# Patient Record
Sex: Female | Born: 1985 | Race: White | Hispanic: No | Marital: Married | Smoking: Never smoker
Health system: Southern US, Community
[De-identification: ages and names within clinical notes are randomized; demographics above are authoritative.]

## PROBLEM LIST (undated history)

## (undated) DIAGNOSIS — N201 Calculus of ureter: Secondary | ICD-10-CM

## (undated) DIAGNOSIS — M797 Fibromyalgia: Secondary | ICD-10-CM

## (undated) DIAGNOSIS — Z808 Family history of malignant neoplasm of other organs or systems: Secondary | ICD-10-CM

## (undated) DIAGNOSIS — I7300 Raynaud's syndrome without gangrene: Secondary | ICD-10-CM

## (undated) DIAGNOSIS — T7840XA Allergy, unspecified, initial encounter: Secondary | ICD-10-CM

## (undated) DIAGNOSIS — G43909 Migraine, unspecified, not intractable, without status migrainosus: Secondary | ICD-10-CM

## (undated) HISTORY — DX: Family history of malignant neoplasm of other organs or systems: Z80.8

## (undated) HISTORY — PX: ABDOMINAL HYSTERECTOMY: SHX81

## (undated) HISTORY — PX: DILATION AND CURETTAGE OF UTERUS: SHX78

## (undated) HISTORY — PX: TUBAL LIGATION: SHX77

## (undated) HISTORY — PX: APPENDECTOMY: SHX54

## (undated) HISTORY — PX: TONSILLECTOMY: SUR1361

## (undated) HISTORY — PX: BREAST BIOPSY: SHX20

## (undated) HISTORY — DX: Allergy, unspecified, initial encounter: T78.40XA

## (undated) HISTORY — PX: LAPAROSCOPY: SHX197

## (undated) HISTORY — PX: OTHER SURGICAL HISTORY: SHX169

---

## 2005-06-27 ENCOUNTER — Emergency Department (HOSPITAL_COMMUNITY): Admission: EM | Admit: 2005-06-27 | Discharge: 2005-06-27 | Payer: Self-pay | Admitting: Family Medicine

## 2005-07-20 ENCOUNTER — Other Ambulatory Visit: Admission: RE | Admit: 2005-07-20 | Discharge: 2005-07-20 | Payer: Self-pay | Admitting: Gynecology

## 2005-09-13 ENCOUNTER — Observation Stay (HOSPITAL_COMMUNITY): Admission: AD | Admit: 2005-09-13 | Discharge: 2005-09-14 | Payer: Self-pay | Admitting: Gynecology

## 2005-09-14 ENCOUNTER — Inpatient Hospital Stay (HOSPITAL_COMMUNITY): Admission: AD | Admit: 2005-09-14 | Discharge: 2005-09-15 | Payer: Self-pay | Admitting: Gynecology

## 2005-09-14 ENCOUNTER — Inpatient Hospital Stay (HOSPITAL_COMMUNITY): Admission: AD | Admit: 2005-09-14 | Discharge: 2005-09-14 | Payer: Self-pay | Admitting: Gynecology

## 2005-09-18 ENCOUNTER — Inpatient Hospital Stay (HOSPITAL_COMMUNITY): Admission: AD | Admit: 2005-09-18 | Discharge: 2005-09-18 | Payer: Self-pay | Admitting: Gynecology

## 2005-10-25 ENCOUNTER — Inpatient Hospital Stay (HOSPITAL_COMMUNITY): Admission: AD | Admit: 2005-10-25 | Discharge: 2005-10-25 | Payer: Self-pay | Admitting: Gynecology

## 2005-10-29 ENCOUNTER — Observation Stay (HOSPITAL_COMMUNITY): Admission: AD | Admit: 2005-10-29 | Discharge: 2005-10-30 | Payer: Self-pay | Admitting: Gynecology

## 2005-11-12 ENCOUNTER — Inpatient Hospital Stay (HOSPITAL_COMMUNITY): Admission: AD | Admit: 2005-11-12 | Discharge: 2005-11-12 | Payer: Self-pay | Admitting: Gynecology

## 2005-12-12 ENCOUNTER — Inpatient Hospital Stay (HOSPITAL_COMMUNITY): Admission: AD | Admit: 2005-12-12 | Discharge: 2005-12-12 | Payer: Self-pay | Admitting: Gynecology

## 2005-12-13 ENCOUNTER — Inpatient Hospital Stay (HOSPITAL_COMMUNITY): Admission: AD | Admit: 2005-12-13 | Discharge: 2005-12-13 | Payer: Self-pay | Admitting: Gynecology

## 2005-12-29 ENCOUNTER — Inpatient Hospital Stay (HOSPITAL_COMMUNITY): Admission: AD | Admit: 2005-12-29 | Discharge: 2005-12-30 | Payer: Self-pay | Admitting: Gynecology

## 2005-12-31 ENCOUNTER — Emergency Department (HOSPITAL_COMMUNITY): Admission: EM | Admit: 2005-12-31 | Discharge: 2005-12-31 | Payer: Self-pay | Admitting: Emergency Medicine

## 2005-12-31 ENCOUNTER — Inpatient Hospital Stay (HOSPITAL_COMMUNITY): Admission: AD | Admit: 2005-12-31 | Discharge: 2005-12-31 | Payer: Self-pay | Admitting: Gynecology

## 2006-01-08 ENCOUNTER — Inpatient Hospital Stay (HOSPITAL_COMMUNITY): Admission: AD | Admit: 2006-01-08 | Discharge: 2006-01-11 | Payer: Self-pay | Admitting: Gynecology

## 2006-01-09 ENCOUNTER — Encounter (INDEPENDENT_AMBULATORY_CARE_PROVIDER_SITE_OTHER): Payer: Self-pay | Admitting: Specialist

## 2006-02-16 ENCOUNTER — Other Ambulatory Visit: Admission: RE | Admit: 2006-02-16 | Discharge: 2006-02-16 | Payer: Self-pay | Admitting: Gynecology

## 2006-08-22 ENCOUNTER — Inpatient Hospital Stay (HOSPITAL_COMMUNITY): Admission: AD | Admit: 2006-08-22 | Discharge: 2006-08-22 | Payer: Self-pay | Admitting: Gynecology

## 2006-11-28 ENCOUNTER — Inpatient Hospital Stay (HOSPITAL_COMMUNITY): Admission: AD | Admit: 2006-11-28 | Discharge: 2006-11-30 | Payer: Self-pay | Admitting: Obstetrics and Gynecology

## 2006-11-28 ENCOUNTER — Ambulatory Visit: Payer: Self-pay | Admitting: Physician Assistant

## 2006-11-29 ENCOUNTER — Encounter: Payer: Self-pay | Admitting: Obstetrics and Gynecology

## 2007-03-15 ENCOUNTER — Ambulatory Visit: Payer: Self-pay | Admitting: *Deleted

## 2007-03-15 ENCOUNTER — Inpatient Hospital Stay (HOSPITAL_COMMUNITY): Admission: AD | Admit: 2007-03-15 | Discharge: 2007-03-16 | Payer: Self-pay | Admitting: Obstetrics & Gynecology

## 2007-06-28 ENCOUNTER — Emergency Department (HOSPITAL_COMMUNITY): Admission: EM | Admit: 2007-06-28 | Discharge: 2007-06-28 | Payer: Self-pay | Admitting: Emergency Medicine

## 2008-06-28 ENCOUNTER — Ambulatory Visit: Payer: Self-pay | Admitting: Vascular Surgery

## 2008-06-28 ENCOUNTER — Ambulatory Visit: Admission: RE | Admit: 2008-06-28 | Discharge: 2008-06-28 | Payer: Self-pay | Admitting: Interventional Radiology

## 2008-06-28 ENCOUNTER — Encounter (INDEPENDENT_AMBULATORY_CARE_PROVIDER_SITE_OTHER): Payer: Self-pay | Admitting: Interventional Radiology

## 2008-09-08 ENCOUNTER — Inpatient Hospital Stay (HOSPITAL_COMMUNITY): Admission: AD | Admit: 2008-09-08 | Discharge: 2008-09-09 | Payer: Self-pay | Admitting: Obstetrics & Gynecology

## 2008-09-24 ENCOUNTER — Other Ambulatory Visit: Admission: RE | Admit: 2008-09-24 | Discharge: 2008-09-24 | Payer: Self-pay | Admitting: Obstetrics and Gynecology

## 2008-12-13 ENCOUNTER — Ambulatory Visit (HOSPITAL_COMMUNITY): Admission: RE | Admit: 2008-12-13 | Discharge: 2008-12-13 | Payer: Self-pay | Admitting: Obstetrics and Gynecology

## 2008-12-13 ENCOUNTER — Encounter (INDEPENDENT_AMBULATORY_CARE_PROVIDER_SITE_OTHER): Payer: Self-pay | Admitting: Obstetrics and Gynecology

## 2008-12-14 ENCOUNTER — Inpatient Hospital Stay: Admission: AD | Admit: 2008-12-14 | Discharge: 2008-12-14 | Payer: Self-pay | Admitting: Obstetrics and Gynecology

## 2008-12-16 ENCOUNTER — Inpatient Hospital Stay (HOSPITAL_COMMUNITY): Admission: AD | Admit: 2008-12-16 | Discharge: 2008-12-16 | Payer: Self-pay | Admitting: Obstetrics and Gynecology

## 2009-05-31 ENCOUNTER — Inpatient Hospital Stay (HOSPITAL_COMMUNITY): Admission: AD | Admit: 2009-05-31 | Discharge: 2009-05-31 | Payer: Self-pay | Admitting: Obstetrics and Gynecology

## 2009-12-29 ENCOUNTER — Emergency Department (HOSPITAL_COMMUNITY): Admission: EM | Admit: 2009-12-29 | Discharge: 2009-12-30 | Payer: Self-pay | Admitting: Emergency Medicine

## 2010-03-23 ENCOUNTER — Inpatient Hospital Stay (INDEPENDENT_AMBULATORY_CARE_PROVIDER_SITE_OTHER)
Admission: RE | Admit: 2010-03-23 | Discharge: 2010-03-23 | Disposition: A | Discharge status: Home / Self Care | Payer: 59 | Source: Ambulatory Visit | Attending: Emergency Medicine | Admitting: Emergency Medicine

## 2010-03-23 DIAGNOSIS — J039 Acute tonsillitis, unspecified: Secondary | ICD-10-CM

## 2010-03-23 LAB — POCT RAPID STREP A (OFFICE): Streptococcus, Group A Screen (Direct): NEGATIVE

## 2010-04-22 LAB — BASIC METABOLIC PANEL
BUN: 9 mg/dL (ref 6–23)
CO2: 25 mEq/L (ref 19–32)
Chloride: 104 mEq/L (ref 96–112)
GFR calc non Af Amer: >60 mL/min (ref >60)
Glucose, Bld: 86 mg/dL (ref 70–99)
Potassium: 3.9 mEq/L (ref 3.5–5.1)
Sodium: 136 mEq/L (ref 135–145)

## 2010-04-22 LAB — CBC
HCT: 36.8 % (ref 36.0–46.0)
Hemoglobin: 12.5 g/dL (ref 12.0–15.0)
MCH: 26.2 pg (ref 26.0–34.0)
MCHC: 34 g/dL (ref 30.0–36.0)
MCV: 77 fL — ABNORMAL LOW (ref 78.0–100.0)
RDW: 14.3 % (ref 11.5–15.5)

## 2010-04-22 LAB — DIFFERENTIAL
Basophils Absolute: 0.1 10*3/uL (ref 0.0–0.1)
Basophils Relative: 1 % (ref 0–1)
Eosinophils Relative: 2 % (ref 0–5)
Monocytes Absolute: 0.5 10*3/uL (ref 0.1–1.0)
Monocytes Relative: 10 % (ref 3–12)
Neutro Abs: 2.6 10*3/uL (ref 1.7–7.7)

## 2010-04-22 LAB — URINALYSIS, ROUTINE W REFLEX MICROSCOPIC
Glucose, UA: NEGATIVE mg/dL
Ketones, ur: NEGATIVE mg/dL
Leukocytes, UA: NEGATIVE
Nitrite: NEGATIVE
Protein, ur: NEGATIVE mg/dL
Urobilinogen, UA: 0.2 mg/dL (ref 0.0–1.0)

## 2010-04-22 LAB — URINE MICROSCOPIC-ADD ON

## 2010-04-29 LAB — URINALYSIS, ROUTINE W REFLEX MICROSCOPIC
Bilirubin Urine: NEGATIVE
Glucose, UA: NEGATIVE mg/dL
Hgb urine dipstick: NEGATIVE
Protein, ur: NEGATIVE mg/dL
Specific Gravity, Urine: 1.02 (ref 1.005–1.030)

## 2010-05-05 ENCOUNTER — Other Ambulatory Visit: Payer: Self-pay | Admitting: Otolaryngology

## 2010-05-14 LAB — DIFFERENTIAL
Basophils Absolute: 0 10*3/uL (ref 0.0–0.1)
Basophils Relative: 1 % (ref 0–1)
Eosinophils Absolute: 0.1 10*3/uL (ref 0.0–0.7)
Eosinophils Relative: 2 % (ref 0–5)
Monocytes Absolute: 0.5 10*3/uL (ref 0.1–1.0)

## 2010-05-14 LAB — URINALYSIS, ROUTINE W REFLEX MICROSCOPIC
Glucose, UA: 100 mg/dL — AB
Specific Gravity, Urine: 1.015 (ref 1.005–1.030)
pH: 5 (ref 5.0–8.0)

## 2010-05-14 LAB — CBC
HCT: 35.1 % — ABNORMAL LOW (ref 36.0–46.0)
HCT: 38 % (ref 36.0–46.0)
Hemoglobin: 11.8 g/dL — ABNORMAL LOW (ref 12.0–15.0)
MCHC: 33.6 g/dL (ref 30.0–36.0)
MCHC: 33.7 g/dL (ref 30.0–36.0)
MCV: 78.5 fL (ref 78.0–100.0)
Platelets: 194 10*3/uL (ref 150–400)
RBC: 4.84 MIL/uL (ref 3.87–5.11)
RDW: 15 % (ref 11.5–15.5)
WBC: 4.4 10*3/uL (ref 4.0–10.5)

## 2010-05-14 LAB — URINE MICROSCOPIC-ADD ON

## 2010-05-18 LAB — CBC
MCHC: 33.6 g/dL (ref 30.0–36.0)
Platelets: 203 10*3/uL (ref 150–400)
RDW: 15.1 % (ref 11.5–15.5)

## 2010-05-18 LAB — ABO/RH
ABO/RH(D): O NEG
Weak D: NEGATIVE

## 2010-05-18 LAB — WET PREP, GENITAL: Yeast Wet Prep HPF POC: NONE SEEN

## 2010-05-18 LAB — POCT PREGNANCY, URINE: Preg Test, Ur: NEGATIVE

## 2010-06-24 NOTE — Discharge Summary (Signed)
NAME:  Cindy Turner, Cindy Turner NO.:  [id number]   MEDICAL RECORD NO.:  [id number]          PATIENT TYPE:  OUT   LOCATION:  MFM                           [facility]   PHYSICIAN:  Ginger Carne, MD  DATE OF BIRTH:  Nov 13, 1985   DATE OF ADMISSION:  11/29/2006  DATE OF DISCHARGE:                               DISCHARGE SUMMARY   REASON FOR HOSPITALIZATION:  Twenty and four-sevenths weeks' gestation,  bleeding.   IN-HOSPITAL PROCEDURES:  Pelvic sonogram.   FINAL DIAGNOSES:  1. Twenty and four-sevenths weeks' gestation.  2. Bleeding.   HOSPITAL COURSE:  This patient is a 25 year old G2, P0-1-0-1 Caucasian  female, estimated date of confinement of April 13, 2007, twenty and four-  sevenths weeks' gestation by dates, who presented with vaginal bleeding  on November 28, 2006 to the [facility] [place].  The patient  denies having trauma or intercourse preceding said event.  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  She has also had a preterm delivery at 6  weeks' gestation and she is O-negative blood type.   The patient was admitted and placed to bedrest, consult by Maternal-  Fetal Medicine obtained.  Ultrasound obtained on November 29, 2006  confirmed dating of 20-5/7 weeks' gestation.  Specifically, her cervix  demonstrated a length of 4.2 cm.  Two amniotic bands were noted as well.  The umbilical cord was hypo-coiled as well.  Recommendation by  ultrasound report from Dr. Ander Slade included concerns pertaining to preterm  birth, trisomy and fetal demise and followup as recommended including  additional fetal testing as well as assessment of fetal growth every 3-4  weeks.   The patient's urine culture demonstrated greater than 100,000 group B  strep and she was placed on amoxicillin 500 mg three times a day in  addition to beginning 17-hydroxyprogesterone caproate 250 mg, received  on  November 29, 2006.  The patient also received 1 ampule of RhoGAM.   The patient was evaluated on the morning of discharge.  Her cervix was  long, closed and thick with scant vaginal flow.  She has minimal pelvic  cramping.  Discussion with her primary obstetrician, Dr. Alvino Chapel, confirmed her status as an active patient in his practice.  He  was agreeable to receive the patient back from [facility].  Course  of care and recommendations were also discussed with Dr. Donah Driver  including continuation of 17-hydroxyprogesterone caproate and ultrasound  findings with attention to amniotic bands and hypocoiled cord. Discharge  summary and u/s report faxed to his office.   DISCHARGE INSTRUCTIONS:  The patient was discharged with routine  instructions including avoiding intercourse, heavy lifting or other  extraneous phacoemulsification, to report to her primary attending any  episodes of bleeding, significant cramping or other concerns.  She  understands the importance of followup appointment and that  her next progesterone injection is due on December 06, 2006.  She was  also given a 7-day course of amoxicillin 500 mg one 3 times a day for 7  days including continuation  of prenatal vitamins.  All questions  answered to the satisfaction of said patient and the patient verbalized  understanding of same.      Ginger Carne, MD  Electronically Signed     SHB/MEDQ  D:  11/30/2006  T:  11/30/2006  Job:  [id number]   cc:   Selinda Michaels MD  Fax #[phone]

## 2010-06-27 NOTE — H&P (Signed)
NAME:  Cindy Turner, Cindy Turner          ACCOUNT NO.:  [id number]   MEDICAL RECORD NO.:  [id number]          PATIENT TYPE:  INP   LOCATION:  9316                          [facility]ILITY:  [facility]   PHYSICIAN:  Timothy P. Fontaine, M.D.DATE OF BIRTH:  March 15, 1985   DATE OF ADMISSION:  09/13/2005  DATE OF DISCHARGE:                                HISTORY & PHYSICAL   CHIEF COMPLAINT:  Pregnancy at 19 to 20 weeks, back for right hip and flank  pain.   HISTORY OF PRESENT ILLNESS:  A 25 year old G1, P0, female at 17 to [redacted] weeks  gestation, two day history of right-sided back pain worsening now, radiating  to her flank and right hip.  The patient has had some nausea and vomiting  associated with this, and some mild constipation.  She has had no vaginal  bleeding, no uterine cramping or contractions, but notes some dysuria, no  real frequency.  She does have a history of left renal lithiasis in the  past, apparently was told they were very small and that she would pass them,  and that no special followup was given.   PAST MEDICAL HISTORY:  Uncomplicated.   PAST SURGICAL HISTORY:  Uncomplicated.   ALLERGIES:  No known drug allergies.   CURRENT MEDICATIONS:  Prenatal vitamins.   REVIEW OF SYSTEMS:  Noncontributory.   SOCIAL HISTORY:  Noncontributory.   FAMILY HISTORY:  Noncontributory.   ADMISSION PHYSICAL EXAMINATION:  VITAL SIGNS:  Temperature 98.3, vital signs  are stable.  HEENT:  Normal.  LUNGS:  Clear.  CARDIAC:  Regular rate.  No murmurs, rubs, or gallops.  ABDOMEN:  Benign.  Uterus U-1, soft, nontender, positive fetal heart tones.  Mild to moderate right suprapubic pain to palpation.  BACK:  Visually normal.  Percussion with significant right CVA tenderness.  PELVIC:  Digital:  Cervix is long and closed.   LABORATORY DATA:  Maternal renal ultrasound shows mild to moderate right  hydronephrosis.  Fetal ultrasound appropriate for dates, normal AFI, normal  placenta, cervix 3.1  trans-abdominally.  Laboratory studies show white count  10.9, hemoglobin 12.5, platelet count 213,000.  Urinalysis shows 7 to 10  WBCs, 3 to 6 RBCs, few bacteria, a few squamous cells.   ASSESSMENT AND PLAN:  A 25 year old G1, P0, 19 weeks, right back to flank  pain, history of renal lithiasis in the past.  Urinalysis suspicious for  possible urinary tract infection.  Mild to moderate hydronephrosis on the  right, all consistent with probable right ureterovesical stone.  I discussed  with the patient and her husband we will plan on  admission for pain control, hydration, and hopefully spontaneous passage.  We will strain all urine and prescribed appropriate pain medication.  We  will also cover with antibiotics given the number of white blood cells and a  few bacteria within the urine.  Consider urology consultation if not  improved within 24 hours.      Timothy P. Fontaine, M.D.  Electronically Signed     TPF/MEDQ  D:  09/13/2005  T:  09/13/2005  Job:  [id number]

## 2010-06-27 NOTE — H&P (Signed)
NAME:  Cindy Turner, Cindy Turner          ACCOUNT NO.:  [id number]   MEDICAL RECORD NO.:  [id number]          PATIENT TYPE:  OBV   LOCATION:  9156                          [facility]  [facility]   PHYSICIAN:  Timothy P. Fontaine, M.D.DATE OF BIRTH:  [birth year]   DATE OF ADMISSION:  10/29/2005  DATE OF DISCHARGE:                                HISTORY & PHYSICAL   CHIEF COMPLAINT:  Vaginal bleeding.   HISTORY OF PRESENT ILLNESS:  This 25 year old G-1, P-0 female at [redacted] weeks  gestation, who presents having awoken from a sleep this afternoon with  vaginal bleeding.  The patient notes some pelvis discomfort described as  aching.  No real cramping, sharp pain, but more aching, in that she was  sleeping when her husband noticed some blood on the sheets.  He awakened her  and they came straight to the office for evaluation.  Of note, the patient  and her husband had intercourse this morning of presentation.  She has no  history of bleeding before.  She was recently evaluated two days ago,  complaining of some cramping and pelvic pressure symptoms.  She was  evaluated in the emergency room.  Her external monitor showed no  contractions.  Her cervix was long and closed.  A fetal fibronectin was  negative.  Her urinalysis was unremarkable.  Her prenatal course has been  otherwise uncomplicated, noting for her OB, her HCC and Chlamydia screen  were done which were negative.  For the remainder of her history, see her  Hollister.   PHYSICAL EXAMINATION:  VITAL SIGNS:  Afebrile, vital signs are stable.  HEENT:  Normal.  LUNGS:  Clear.  HEART:  A regular rate.  No rubs, murmurs or gallops.  ABDOMEN:  Benign.  PELVIC:  Uterus appropriate for dates.  Positive fetal heart tones.  No  tenderness, masses or acute changes.  On pelvic external shows some blood  staining on the vulva.  Initial vaginal exam shows some light blood  staining.  No active bleeding.  Speculum exam shows the cervix to be long  and closed.   Cervix appears to be friable with some oozing.  Cervical mucus  noted to be normal in appearance, with no blood in the actual mucus or  coming from the os, but on the external cervical surface.  GC and Chlamydia  screen were done.  BIMANUAL:  The uterus again appropriate for dates.  No acute changes.  Her  cervix is long and closed by digital examination.   ASSESSMENT/PLAN:  This 25 year old gravida 1, para 0, [redacted] weeks gestation  with bleeding following intercourse.  Examination is consistent with a  friable cervix.  Discharge appears normal.  Slight oozing at the cervix now.  No acute significant bleeding does not appear to be from the cervical os:  The Gonorrhea and Chlamydia screen were done.  We will go ahead and admit  her to the hospital overnight for observation due to her 25 weeks with  bleeding.  External monitors will rule out contractions.  An ultrasound for  the placental check.  Rule out abruption or other  abnormalities and cervical length  baseline.  Check Kleihauer-Betke.  Also  baseline urine culture, again noting her urinalysis was unremarkable several  days ago.   Will observe overnight.  If she does well, then will plan on discharge in  the a.m.      Timothy P. Fontaine, M.D.  Electronically Signed     TPF/MEDQ  D:  10/29/2005  T:  10/30/2005  Job:  [id number]

## 2010-06-27 NOTE — Discharge Summary (Signed)
NAME:  Cindy Turner, Cindy Turner          ACCOUNT NO.:  [id number]   MEDICAL RECORD NO.:  [id number]          PATIENT TYPE:  INP   LOCATION:  9316                          [facility]  [facility]   PHYSICIAN:  Timothy P. Fontaine, M.D.DATE OF BIRTH:  Feb 12, 1985   DATE OF ADMISSION:  09/13/2005  DATE OF DISCHARGE:                                 DISCHARGE SUMMARY   DISCHARGE DIAGNOSES:  1.  Pregnancy at 19 weeks.  2.  Renal lithiasis.  3.  Questionable urinary tract infection.   PROCEDURE:  None.   HOSPITAL COURSE:  The patient was admitted with right back flank pain  consistent with renal lithiasis.  She did a urinalysis which showed 7-10  wbc's, 3-6 rbc's, few bacteria, and hyaline casts.  A WBC of 10.9, platelet  count of 214, hemoglobin of 12.5.  She had a maternal renal ultrasound that  showed mild to moderate right hydronephrosis and a fetal ultrasound which  was consistent with dates.  No placental abnormality, normal amniotic fluid  and the cervix 3.1 cm, closed.  The patient was begun on IV hydration, pain  management, straining of her urine, IV antibiotics of cefotetan 1 a q.12h.  The morning following admission the patient reported passing a granular area  in her urine and inspection showed a very small sandy-size granular area.  The patient reported that her pain was much improved and on exam she had no  CVA tenderness or abdominal tenderness.  The patient was discharged home to  continue hydration, rest, Macrobid 100 b.i.d. x7 days, Tylenol No. 3 #20 one  to two p.o. q.4-6h. p.r.n. pain with instructions to follow up in 1 week in  the office, sooner ASAP if increasing pain, abdominal pain, vaginal bleeding  or any other symptoms.      Timothy P. Fontaine, M.D.  Electronically Signed     TPF/MEDQ  D:  09/14/2005  T:  09/14/2005  Job:  [id number]

## 2010-06-27 NOTE — H&P (Signed)
NAME:  Cindy Turner, MENTER          ACCOUNT NO.:  [id number]   MEDICAL RECORD NO.:  [id number]          PATIENT TYPE:  INP   LOCATION:  9168                          [facility]   PHYSICIAN:  Timothy P. Fontaine, M.D.DATE OF BIRTH:  1985/03/24   DATE OF ADMISSION:  01/08/2006  DATE OF DISCHARGE:                              HISTORY & PHYSICAL   CHIEF COMPLAINT:  Labor.   HISTORY OF PRESENT ILLNESS:  A 25 year old G1, P0 female at [redacted] weeks  gestation admitted with complaints of regular contractions every 5  minutes. The patient was admitted per triage at 3-4 cm dilated, bloody  show, regular contractions every 5 minutes, membranes intact. Positive  beta strep history. She was admitted for labor and delivery observation.  Was begun on antibiotic strep prophylaxis. Prenatal course has been  complicated by multiple ER evaluations for contractions and abdominal  pain. Rule out labor. She is RH negative and is positive beta strep  carrier.   PHYSICAL EXAMINATION:  VITAL SIGNS:  Afebrile, stable.  HEENT:  Normal, clear.  CARDIAC:  Regular rate. No rubs, murmurs or gallops.  ABDOMEN:  Gravid vertex fetus consistent with 36 weeks. External monitor  showed contractions every 5 minutes with reactive fetal tracing.  PELVIC:  Shows positive bloody show, 60-70% effaced, -2 station, 2-3 cm  dilated. Bulging membranes noted, vertex presentation. Situation was  discussed with patient and her family. Appears to be in prodromal early  labor at 36 weeks. Has received antibiotic prophylaxis for positive beta  strep carrier. Options of continued expectant management versus  artificial rupture of membranes was discussed, and the patient strongly  desires to proceed with artificial rupture of membranes and delivery.  Again the issues of prematurity were reviewed. The potential for  neonatal ICU admission is discussed all of which she understands and  accepts. The patient's membranes were subsequently  ruptured. The fluid  noted to be clear. Internal monitors were placed.      Timothy P. Fontaine, M.D.  Electronically Signed     TPF/MEDQ  D:  01/09/2006  T:  01/09/2006  Job:  [id number]

## 2010-06-27 NOTE — H&P (Signed)
NAME:  Cindy Turner, Cindy Turner          ACCOUNT NO.:  [id number]   MEDICAL RECORD NO.:  [id number]          PATIENT TYPE:  INP   LOCATION:  9160                          [facility]   PHYSICIAN:  Timothy P. Fontaine, M.D.DATE OF BIRTH:  10-18-1985   DATE OF ADMISSION:  12/29/2005  DATE OF DISCHARGE:                                HISTORY & PHYSICAL   CHIEF COMPLAINT:  Contractions.   HISTORY OF PRESENT ILLNESS:  A 25 year old G1, P0, female at 34-6/7 weeks'  gestation, who presents complaining of contractions.  The patient's prenatal  course has been complicated by multiple presentations for cramping and  contractions, all without cervical change.  The patient relates last p.m.  increasing contractions every 5 minutes and was instructed to present to  maternity admissions for evaluation.  Upon evaluation in maternity  admissions, she was found to be contracting every 3-5 minutes with a  reported cervical exam of 3 cm with bloody show and was admitted for  observation.  The patient was noted to have mild elevated blood pressures in  the 130-140 over 80-90 range, and PIH labs were also obtained.  She was  noted to have negative urinary protein and normal PIH labs.  The patient was  monitored throughout the evening and upon evaluation this morning, her  contractions have spaced out to every 8-10 minutes.  The patient also upon  admission had complained to the nursing staff of some epigastric discomfort,  headaches, and visual changes, although on questioning the patient relates  that these have been present on and off for weeks and are more consistent  with pregnancy-related changes and not hypertensive-related.  Prenatal  course otherwise has been uncomplicated other than her multiple bouts of  cramping.  She is Rh negative.  The father is Rh positive.  For the  remainder of the history, see her Hollister.   PHYSICAL EXAMINATION:  VITAL SIGNS:  On exam this morning, blood pressure is  130/70.  Vital signs are stable.  HEENT:  Normal.  LUNGS:  Clear.  CARDIAC:  Regular rate.  No rubs, murmurs or gallops.  ABDOMEN:  Gravid, consistent with dates.  External monitor shows a reactive  fetal tracing, no decelerations.  Contractions every 8 minutes.  PELVIC:  Cervix is tight fingertip, 50%, high, ballottable, vertex.  EXTREMITIES:  Without edema.  DTRs 2+, one beat clonus.   ASSESSMENT AND PLAN:  Thirty-four to 35 weeks, contractions, without  significant cervical change from prior office exams.  Contractions now  spacing out.  The patient did receive IV hydration.  She was also given an  antibiotic dose for strep protocol in anticipation of vaginal delivery.  Discussed the situation with the patient and her significant other, at 9-  6/7, issues of proceeding with delivery versus expectant management, no  steroids.  Given the total picture, we will withhold steroids at this time  as do not anticipate immediate delivery.  Recommend oral tocolytic, decrease  symptomatic contractions.  Does not appear to be in overt labor.  Will start  with terbutaline 5 mg now and 2.5 mg q.4h., see if contractions resolve.  The  patient otherwise is doing well.  Will monitor blood pressure.  If acceptable, will ultimately plan to discharge later for at-home bed rest,  continued short course of oral tocolytics.  Will check ultrasound for  placental abnormalities, AFI, prior to discharge.  The plan was discussed  with the patient and her significant other, who agree with the plan.      Timothy P. Fontaine, M.D.  Electronically Signed     TPF/MEDQ  D:  12/30/2005  T:  12/30/2005  Job:  [id number]

## 2010-06-27 NOTE — H&P (Signed)
NAME:  Cindy Turner, PINCOCK NO.:  [id number]   MEDICAL RECORD NO.:  [id number]          PATIENT TYPE:  MAT   LOCATION:  MATC                          [facility]   PHYSICIAN:  Timothy P. Fontaine, M.D.DATE OF BIRTH:  19-Mar-1985   DATE OF ADMISSION:  11/12/2005  DATE OF DISCHARGE:                                HISTORY & PHYSICAL   CHIEF COMPLAINT:  Abdominal  pain.   HISTORY OF PRESENT ILLNESS:  A 25 year old G1, P0 female at [redacted] weeks  gestation presents with a several-day history of lower abdominal  pelvic  cramping that is coming and going on an infrequent basis.  Patient notes  that the last 12 hours or so the cramping has increased with some radiation  to her back, and she presents for evaluation.  The patient denies any  bleeding, discharge, changes, leak or gush of fluid.  Her history is  significant for similar episodes in the past during this pregnancy.  Workup  at that time was negative.  Patient relates no recent trauma, intercourse or  other precipitating events.  For the remainder of her history, see her  Hollister.   PHYSICAL EXAMINATION:  VITAL SIGNS:  Afebrile.  Vital signs area stable.  HEENT:  Normal.  LUNGS:  Clear.  CARDIAC:  Regular rate without rubs, murmurs or gallops.  ABDOMEN:  Uterus soft and nontender, appropriate for dates.  External  monitor show a reassuring fetal tracing.  No decelerations and no regular  contractions.  BACK:  Spine is straight.  No CVA tenderness.  PELVIC:  Cervix is long.  External os fingertip, internal os closed.  Normal-  appearing vaginal discharge.   ASSESSMENT/PLAN:  A 25 year old G1, P0, [redacted] weeks gestation, pelvic cramping  over the last several days, increased over the last 12 hours, no overt  precipitating events.  Initially on presentation, fetal fibronectin was  obtained.  Digital exam per nursing was per my physical above.  She received  IV hydration.  Her UA is low level, showing some mild leukocyte  esterase,  rare WBCs, few bacteria.  She was begun on IV hydration, and her pain has  resolved over the observation time.  My recheck of her cervix now, per my  physical above, is unchanged from the original nursing check.  Will await  fetal fibronectin results.  Will treat low level UTI with Macrobid 100  b.i.d. x7 days, first dose now before she leaves the hospital.  Will check  ultrasound for cervical length baseline and placental rule out  abnormalities.  She does have an appointment in five days in the office.  She will continue with low level activity until then, if any recurrent  abdominal discomfort at all, discharge, changes, bleeding, spotting.  She  knows to call ASAP.  I think her cervical exam is physiologic, as the  internal os is closed.  External os is slightly opened, and I do not think  this is a result of any contractions.  We will check the ultrasound to rule  out funneling or other evidence of cervical shortening.      Timothy P. Fontaine, M.D.  Electronically Signed     TPF/MEDQ  D:  11/12/2005  T:  11/12/2005  Job:  [id number]

## 2010-06-27 NOTE — H&P (Signed)
NAME:  LESA, VANDALL NO.:  [id number]   MEDICAL RECORD NO.:  [id number]          PATIENT TYPE:  MAT   LOCATION:  MATC                          [facility]   PHYSICIAN:  Juan H. Lily Peer, M.D.DATE OF BIRTH:  November 23, 1985   DATE OF ADMISSION:  09/18/2005  DATE OF DISCHARGE:                                HISTORY & PHYSICAL   HISTORY:  The patient is a 25 year old gravida 1, para 0, estimated date of  confinement February 04, 2006, currently 20 weeks' gestation, had presented  to [facility] today stating she was having some lower abdominal  cramping since Monday and then had diarrhea for two days.  The patient was  seen last weekend and admitted to the hospital due to the fact that she has  had a history of kidney stones and was hydrated and sent home on oral  antibiotic.  The patient was placed on the monitor and uterine  irritability  was noted although at 20 weeks.  There was no gross evidence of any  decelerations on fetal monitoring.  Her vital signs were as follows:  Temperature 98.1, pulse 74, respirations 20, blood pressure 123/62.  Fetal  heart rate 144 beats/min.   PHYSICAL EXAMINATION:  HEART:  Regular rate and rhythm.  No murmurs or  gallops.  ABDOMEN:  Soft and nontender.  PELVIC:  Bartholin's, urethra and Skene's within normal limits.  VAGINA:  There were no gross lesions.  There was no blood in the vaginal  vault, and cervix was long, closed and posterior.   The patient stated she had had some bleeding earlier today, so an ultrasound  was ordered.  The ultrasound demonstrated a viable fetus in the breech  presentation consistent with 20 weeks.  Amniotic fluid volume was normal.  Cervical length was 3.5 cm.  The patient had a wet prep which was  insignificant and her urinalysis otherwise unremarkable.  The patient was  instructed to discontinue her Macrobid that she had taken for three days.  This could have upset her stomach and caused the  diarrhea and the cramping  that she was experiencing.  I cannot determine where she experienced her  blood.  She cannot recall when she had intercourse the past few days.  My  only suspicion is perhaps direct contact from the penis on the cervix  contributed to some bleeding.  There was no evidence of subchorionic bleed  on the ultrasound, and she did not appear to be in any acute distress.  The  patient was instructed to discontinue the Macrobid, stay on a liquid diet  for the next 24 hours and advance her diet as tolerated, and she remains  taking Imodium p.r.n. and to follow up in the office next week.  Refrain  from intercourse and keep up with her fluids and bedrest.  Signs and  symptoms of preterm labor were discussed and information sheet was provided.      Juan H. Lily Peer, M.D.  Electronically Signed     JHF/MEDQ  D:  09/18/2005  T:  09/18/2005  Job:  [id number]

## 2010-07-29 ENCOUNTER — Inpatient Hospital Stay (HOSPITAL_COMMUNITY)
Admission: EM | Admit: 2010-07-29 | Discharge: 2010-07-30 | Disposition: A | Discharge status: Home / Self Care | Payer: 59 | Source: Ambulatory Visit | Attending: Obstetrics and Gynecology | Admitting: Obstetrics and Gynecology

## 2010-07-29 DIAGNOSIS — IMO0001 Reserved for inherently not codable concepts without codable children: Secondary | ICD-10-CM | POA: Insufficient documentation

## 2010-07-29 DIAGNOSIS — N390 Urinary tract infection, site not specified: Secondary | ICD-10-CM | POA: Insufficient documentation

## 2010-07-29 DIAGNOSIS — G43909 Migraine, unspecified, not intractable, without status migrainosus: Secondary | ICD-10-CM | POA: Insufficient documentation

## 2010-07-29 DIAGNOSIS — I7300 Raynaud's syndrome without gangrene: Secondary | ICD-10-CM | POA: Insufficient documentation

## 2010-07-30 ENCOUNTER — Inpatient Hospital Stay (HOSPITAL_COMMUNITY): Payer: 59

## 2010-07-30 ENCOUNTER — Encounter (HOSPITAL_COMMUNITY): Payer: Self-pay

## 2010-07-30 LAB — URINALYSIS, ROUTINE W REFLEX MICROSCOPIC
Ketones, ur: NEGATIVE mg/dL
Nitrite: POSITIVE — AB
Specific Gravity, Urine: >1.03 — ABNORMAL HIGH (ref 1.005–1.030)
Urobilinogen, UA: 0.2 mg/dL (ref 0.0–1.0)
pH: 6 (ref 5.0–8.0)

## 2010-07-30 LAB — POCT PREGNANCY, URINE: Preg Test, Ur: NEGATIVE

## 2010-07-30 LAB — URINE MICROSCOPIC-ADD ON

## 2010-08-28 ENCOUNTER — Emergency Department (HOSPITAL_COMMUNITY): Payer: 59

## 2010-08-28 ENCOUNTER — Emergency Department (HOSPITAL_COMMUNITY)
Admission: EM | Admit: 2010-08-28 | Discharge: 2010-08-29 | Disposition: A | Discharge status: Home / Self Care | Payer: 59 | Attending: Emergency Medicine | Admitting: Emergency Medicine

## 2010-08-28 DIAGNOSIS — R10813 Right lower quadrant abdominal tenderness: Secondary | ICD-10-CM | POA: Insufficient documentation

## 2010-08-28 DIAGNOSIS — R110 Nausea: Secondary | ICD-10-CM | POA: Insufficient documentation

## 2010-08-28 DIAGNOSIS — R109 Unspecified abdominal pain: Secondary | ICD-10-CM | POA: Insufficient documentation

## 2010-08-28 HISTORY — DX: Fibromyalgia: M79.7

## 2010-08-28 HISTORY — DX: Migraine, unspecified, not intractable, without status migrainosus: G43.909

## 2010-08-28 LAB — URINE MICROSCOPIC-ADD ON

## 2010-08-28 LAB — URINALYSIS, ROUTINE W REFLEX MICROSCOPIC
Glucose, UA: NEGATIVE mg/dL
Leukocytes, UA: NEGATIVE
Nitrite: NEGATIVE
Specific Gravity, Urine: 1.023 (ref 1.005–1.030)
pH: 7 (ref 5.0–8.0)

## 2010-08-28 LAB — POCT PREGNANCY, URINE: Preg Test, Ur: NEGATIVE

## 2010-08-29 ENCOUNTER — Encounter (HOSPITAL_COMMUNITY): Payer: Self-pay | Admitting: Radiology

## 2010-08-29 LAB — CBC
Hemoglobin: 11.9 g/dL — ABNORMAL LOW (ref 12.0–15.0)
MCH: 25.5 pg — ABNORMAL LOW (ref 26.0–34.0)
MCHC: 33.4 g/dL (ref 30.0–36.0)
Platelets: 186 10*3/uL (ref 150–400)
RDW: 14 % (ref 11.5–15.5)

## 2010-08-29 LAB — COMPREHENSIVE METABOLIC PANEL
ALT: 12 U/L (ref 0–35)
Albumin: 3.9 g/dL (ref 3.5–5.2)
Calcium: 9 mg/dL (ref 8.4–10.5)
GFR calc Af Amer: >60 mL/min (ref >60)
Glucose, Bld: 87 mg/dL (ref 70–99)
Sodium: 136 mEq/L (ref 135–145)
Total Protein: 7 g/dL (ref 6.0–8.3)

## 2010-08-29 LAB — DIFFERENTIAL
Basophils Absolute: 0.1 10*3/uL (ref 0.0–0.1)
Basophils Relative: 1 % (ref 0–1)
Eosinophils Absolute: 0.1 10*3/uL (ref 0.0–0.7)
Eosinophils Relative: 2 % (ref 0–5)
Monocytes Absolute: 0.4 10*3/uL (ref 0.1–1.0)
Monocytes Relative: 9 % (ref 3–12)

## 2010-08-29 LAB — SAMPLE TO BLOOD BANK

## 2010-08-29 MED ORDER — IOHEXOL 300 MG/ML  SOLN
100.0000 mL | Freq: Once | INTRAMUSCULAR | Status: AC | PRN
  Administered 2010-08-29: 100 mL via INTRAVENOUS

## 2010-08-30 ENCOUNTER — Emergency Department (HOSPITAL_COMMUNITY)
Admission: EM | Admit: 2010-08-30 | Discharge: 2010-08-30 | Disposition: A | Discharge status: Home / Self Care | Payer: 59 | Attending: Emergency Medicine | Admitting: Emergency Medicine

## 2010-08-30 ENCOUNTER — Emergency Department (HOSPITAL_COMMUNITY): Payer: 59

## 2010-08-30 DIAGNOSIS — R10813 Right lower quadrant abdominal tenderness: Secondary | ICD-10-CM | POA: Insufficient documentation

## 2010-08-30 DIAGNOSIS — N739 Female pelvic inflammatory disease, unspecified: Secondary | ICD-10-CM | POA: Insufficient documentation

## 2010-08-30 DIAGNOSIS — N83209 Unspecified ovarian cyst, unspecified side: Secondary | ICD-10-CM | POA: Insufficient documentation

## 2010-08-30 DIAGNOSIS — Z87442 Personal history of urinary calculi: Secondary | ICD-10-CM | POA: Insufficient documentation

## 2010-08-30 DIAGNOSIS — N949 Unspecified condition associated with female genital organs and menstrual cycle: Secondary | ICD-10-CM | POA: Insufficient documentation

## 2010-08-30 LAB — URINE MICROSCOPIC-ADD ON

## 2010-08-30 LAB — URINALYSIS, ROUTINE W REFLEX MICROSCOPIC
Bilirubin Urine: NEGATIVE
Glucose, UA: NEGATIVE mg/dL
Ketones, ur: NEGATIVE mg/dL
Nitrite: NEGATIVE
Specific Gravity, Urine: 1.015 (ref 1.005–1.030)
pH: 5.5 (ref 5.0–8.0)

## 2010-08-30 LAB — CBC
Hemoglobin: 11.9 g/dL — ABNORMAL LOW (ref 12.0–15.0)
MCHC: 31.6 g/dL (ref 30.0–36.0)
RDW: 14.1 % (ref 11.5–15.5)
WBC: 4.7 10*3/uL (ref 4.0–10.5)

## 2010-08-30 LAB — GC/CHLAMYDIA PROBE AMP, GENITAL
Chlamydia, DNA Probe: NEGATIVE
GC Probe Amp, Genital: NEGATIVE

## 2010-08-30 LAB — HEPATIC FUNCTION PANEL
AST: 15 U/L (ref 0–37)
Albumin: 4.1 g/dL (ref 3.5–5.2)
Total Protein: 7.2 g/dL (ref 6.0–8.3)

## 2010-08-30 LAB — POCT I-STAT, CHEM 8
Calcium, Ion: 1.19 mmol/L (ref 1.12–1.32)
Chloride: 102 mEq/L (ref 96–112)
Glucose, Bld: 95 mg/dL (ref 70–99)
HCT: 40 % (ref 36.0–46.0)
TCO2: 24 mmol/L (ref 0–100)

## 2010-08-30 LAB — URINE CULTURE
Colony Count: NO GROWTH
Culture  Setup Time: 201207200448
Culture: NO GROWTH

## 2010-08-30 LAB — POCT PREGNANCY, URINE: Preg Test, Ur: NEGATIVE

## 2010-08-30 LAB — WET PREP, GENITAL: Yeast Wet Prep HPF POC: NONE SEEN

## 2010-09-01 ENCOUNTER — Emergency Department (HOSPITAL_COMMUNITY)
Admission: EM | Admit: 2010-09-01 | Discharge: 2010-09-01 | Disposition: A | Discharge status: Home / Self Care | Payer: 59 | Attending: Emergency Medicine | Admitting: Emergency Medicine

## 2010-09-01 ENCOUNTER — Emergency Department (HOSPITAL_COMMUNITY): Payer: 59

## 2010-09-01 ENCOUNTER — Encounter (HOSPITAL_COMMUNITY): Payer: Self-pay | Admitting: *Deleted

## 2010-09-01 ENCOUNTER — Inpatient Hospital Stay (EMERGENCY_DEPARTMENT_HOSPITAL)
Admission: AD | Admit: 2010-09-01 | Discharge: 2010-09-01 | Disposition: A | Discharge status: Home / Self Care | Payer: 59 | Source: Ambulatory Visit | Attending: Obstetrics and Gynecology | Admitting: Obstetrics and Gynecology

## 2010-09-01 DIAGNOSIS — R161 Splenomegaly, not elsewhere classified: Secondary | ICD-10-CM | POA: Insufficient documentation

## 2010-09-01 DIAGNOSIS — IMO0001 Reserved for inherently not codable concepts without codable children: Secondary | ICD-10-CM | POA: Insufficient documentation

## 2010-09-01 DIAGNOSIS — R1011 Right upper quadrant pain: Secondary | ICD-10-CM | POA: Insufficient documentation

## 2010-09-01 DIAGNOSIS — I7300 Raynaud's syndrome without gangrene: Secondary | ICD-10-CM | POA: Insufficient documentation

## 2010-09-01 HISTORY — DX: Raynaud's syndrome without gangrene: I73.00

## 2010-09-01 LAB — HEPATITIS PANEL, ACUTE
HCV Ab: NEGATIVE
Hep A IgM: NEGATIVE
Hep B C IgM: NEGATIVE
Hepatitis B Surface Ag: NEGATIVE

## 2010-09-01 LAB — COMPREHENSIVE METABOLIC PANEL
ALT: 14 U/L (ref 0–35)
AST: 15 U/L (ref 0–37)
Albumin: 4.3 g/dL (ref 3.5–5.2)
Alkaline Phosphatase: 59 U/L (ref 39–117)
BUN: 8 mg/dL (ref 6–23)
CO2: 30 mEq/L (ref 19–32)
Calcium: 9.8 mg/dL (ref 8.4–10.5)
Chloride: 98 mEq/L (ref 96–112)
Creatinine, Ser: 0.75 mg/dL (ref 0.50–1.10)
GFR calc Af Amer: >60 mL/min (ref >60)
GFR calc non Af Amer: >60 mL/min (ref >60)
Glucose, Bld: 90 mg/dL (ref 70–99)
Potassium: 3.7 mEq/L (ref 3.5–5.1)
Sodium: 134 mEq/L — ABNORMAL LOW (ref 135–145)
Total Bilirubin: 0.5 mg/dL (ref 0.3–1.2)
Total Protein: 8.5 g/dL — ABNORMAL HIGH (ref 6.0–8.3)

## 2010-09-01 LAB — CBC
Platelets: 179 10*3/uL (ref 150–400)
RBC: 5.11 MIL/uL (ref 3.87–5.11)
RDW: 14 % (ref 11.5–15.5)
WBC: 6.1 10*3/uL (ref 4.0–10.5)

## 2010-09-01 LAB — LIPASE, BLOOD: Lipase: 16 U/L (ref 11–59)

## 2010-09-01 LAB — AMYLASE: Amylase: 73 U/L (ref 0–105)

## 2010-09-01 MED ORDER — GI COCKTAIL ~~LOC~~
30.0000 mL | Freq: Three times a day (TID) | ORAL | Status: DC | PRN
  Administered 2010-09-01: 30 mL via ORAL
  Filled 2010-09-01: qty 30

## 2010-09-01 NOTE — ED Provider Notes (Signed)
History     Chief Complaint  Patient presents with  . Abdominal Pain   HPI 25 y.o.G3P2013, not pregnant, here c/o RUQ pain. Has been evaluated twice at [facility] for same complaint, once on 7/19, was told she had a problem with her liver and was given pain meds and instruction for outpatient f/u, once on 7/21, diagnosed with PID and given rx for abx, did not start prescription. Pain is ongoing and worsening. RUQ and epigastric, radiates to back, comes in waves. + nausea, no vomiting. No constipation or diarrhea.   OB History    Grav Para Term Preterm Abortions TAB SAB Ect Mult Living   3 2 2  1  1   3       Past Medical History  Diagnosis Date  . Kidney stones   . Migraines   . Raynaud disease   . Fibromyalgia   . Kidney stones   . Raynaud's disease     Past Surgical History  Procedure Date  . Tubal ligation   . Laparoscopy     No family history on file.  History  Substance Use Topics  . Smoking status: Never Smoker   . Smokeless tobacco: Not on file  . Alcohol Use: No    Allergies:  Allergies  Allergen Reactions  . Latex   . Morphine And Related     No prescriptions prior to admission    Review of Systems  Constitutional: Negative for fever and chills.  HENT: Negative.   Respiratory: Negative.   Cardiovascular: Negative.   Gastrointestinal: Positive for heartburn, nausea and abdominal pain. Negative for vomiting, diarrhea and constipation.  Genitourinary: Negative.   Musculoskeletal: Negative.   Neurological: Negative.   Psychiatric/Behavioral: Negative.    Physical Exam   Blood pressure 127/72, pulse 63, temperature 97.1 F (36.2 C), temperature source Oral, resp. rate 20, height 5\' 7"  (1.702 m), weight 59.875 kg (132 lb).  Physical Exam  Nursing note and vitals reviewed. Constitutional: She is oriented to person, place, and time. She appears well-developed and well-nourished. She appears distressed.  Cardiovascular: Normal rate.   Respiratory:  Effort normal.  GI: Soft. Bowel sounds are normal. She exhibits no distension and no mass. There is tenderness (severe in RUQ and epigastric area). There is guarding. There is no rebound.  Musculoskeletal: Normal range of motion.  Neurological: She is alert and oriented to person, place, and time.  Skin: Skin is warm and dry.  Psychiatric: She has a normal mood and affect.    MAU Course  Procedures  Pt declines pain medication - states she does not want to be viewed as drug seeking, "I just want to find out what's wrong"  Results for orders placed during the hospital encounter of 09/01/10 (from the past 24 hour(s))  AMYLASE     Status: Normal   Collection Time   09/01/10  1:21 AM      Component Value Range   Amylase 73  0 - 105 (U/L)  LIPASE, BLOOD     Status: Normal   Collection Time   09/01/10  1:21 AM      Component Value Range   Lipase 16  11 - 59 (U/L)  CBC     Status: Abnormal   Collection Time   09/01/10  1:21 AM      Component Value Range   WBC 6.1  4.0 - 10.5 (K/uL)   RBC 5.11  3.87 - 5.11 (MIL/uL)   Hemoglobin 12.9  12.0 - 15.0 (g/dL)  HCT 39.5  36.0 - 46.0 (%)   MCV 77.3 (*) 78.0 - 100.0 (fL)   MCH 25.2 (*) 26.0 - 34.0 (pg)   MCHC 32.7  30.0 - 36.0 (g/dL)   RDW 16.1  09.6 - 04.5 (%)   Platelets 179  150 - 400 (K/uL)  COMPREHENSIVE METABOLIC PANEL     Status: Abnormal   Collection Time   09/01/10  1:21 AM      Component Value Range   Sodium 134 (*) 135 - 145 (mEq/L)   Potassium 3.7  3.5 - 5.1 (mEq/L)   Chloride 98  96 - 112 (mEq/L)   CO2 30  19 - 32 (mEq/L)   Glucose, Bld 90  70 - 99 (mg/dL)   BUN 8  6 - 23 (mg/dL)   Creatinine, Ser 4.09  0.50 - 1.10 (mg/dL)   Calcium 9.8  8.4 - 81.1 (mg/dL)   Total Protein 8.5 (*) 6.0 - 8.3 (g/dL)   Albumin 4.3  3.5 - 5.2 (g/dL)   AST 15  0 - 37 (U/L)   ALT 14  0 - 35 (U/L)   Alkaline Phosphatase 59  39 - 117 (U/L)   Total Bilirubin 0.5  0.3 - 1.2 (mg/dL)   GFR calc non Af Amer >60  >60 (mL/min)   GFR calc Af Amer >60   >60 (mL/min)     Assessment and Plan  25 yo female with RUQ abdominal pain Pt d/c'd from MAU and instructed to f/u at [facility] for further eval  Shaneece Stockburger 09/01/2010, 3:25 AM

## 2010-09-01 NOTE — Progress Notes (Signed)
Has been in [facility] ER twice this week, had CT scan,  2 different diagnosis per pt.  Right upper quadrant pain

## 2010-10-31 LAB — URINALYSIS, ROUTINE W REFLEX MICROSCOPIC
Glucose, UA: NEGATIVE
Protein, ur: NEGATIVE
Specific Gravity, Urine: 1.02

## 2010-10-31 LAB — URIC ACID: Uric Acid, Serum: 3.1

## 2010-10-31 LAB — COMPREHENSIVE METABOLIC PANEL
ALT: 13
Alkaline Phosphatase: 52
BUN: 7
Chloride: 101
Glucose, Bld: 82
Potassium: 3.5
Sodium: 133 — ABNORMAL LOW
Total Bilirubin: 0.8
Total Protein: 6.1

## 2010-10-31 LAB — URINE MICROSCOPIC-ADD ON

## 2010-10-31 LAB — CBC
HCT: 34.3 — ABNORMAL LOW
Hemoglobin: 12
MCHC: 34.9
RDW: 14.5

## 2010-11-05 LAB — POCT I-STAT, CHEM 8
BUN: 7
Calcium, Ion: 1.21
Creatinine, Ser: 0.9
Hemoglobin: 14.6
TCO2: 25

## 2010-11-19 LAB — RH IMMUNE GLOBULIN WORKUP (NOT WOMEN'S HOSP): ABO/RH(D): O NEG

## 2010-11-19 LAB — URINALYSIS, ROUTINE W REFLEX MICROSCOPIC
Glucose, UA: NEGATIVE
Ketones, ur: 15 — AB
Protein, ur: 100 — AB
Urobilinogen, UA: 0.2

## 2010-11-19 LAB — DIFFERENTIAL
Basophils Relative: 1
Eosinophils Absolute: 0.1
Eosinophils Relative: 1
Monocytes Absolute: 0.6
Neutro Abs: 5.4

## 2010-11-19 LAB — TYPE AND SCREEN: Antibody Screen: POSITIVE

## 2010-11-19 LAB — DIC (DISSEMINATED INTRAVASCULAR COAGULATION)PANEL
D-Dimer, Quant: 0.24
INR: 1.2
Prothrombin Time: 15

## 2010-11-19 LAB — CBC
HCT: 34.9 — ABNORMAL LOW
Hemoglobin: 12
MCHC: 34.4
MCV: 81
RBC: 4.3
WBC: 7.3

## 2010-11-19 LAB — URINE MICROSCOPIC-ADD ON

## 2010-11-19 LAB — RPR: RPR Ser Ql: NONREACTIVE

## 2010-11-19 LAB — URINE CULTURE

## 2010-11-19 LAB — STREP B DNA PROBE: Strep Group B Ag: POSITIVE

## 2010-11-19 LAB — GC/CHLAMYDIA PROBE AMP, GENITAL: GC Probe Amp, Genital: NEGATIVE

## 2010-11-25 LAB — URINALYSIS, ROUTINE W REFLEX MICROSCOPIC
Bilirubin Urine: NEGATIVE
Glucose, UA: NEGATIVE
Hgb urine dipstick: NEGATIVE
Specific Gravity, Urine: >1.03 — ABNORMAL HIGH
pH: 6

## 2010-11-25 LAB — WET PREP, GENITAL
Clue Cells Wet Prep HPF POC: NONE SEEN
Trich, Wet Prep: NONE SEEN
Yeast Wet Prep HPF POC: NONE SEEN

## 2010-11-25 LAB — POCT PREGNANCY, URINE
Operator id: 134401
Preg Test, Ur: POSITIVE

## 2010-11-28 ENCOUNTER — Other Ambulatory Visit: Payer: Self-pay | Admitting: Family Medicine

## 2010-11-28 DIAGNOSIS — N63 Unspecified lump in unspecified breast: Secondary | ICD-10-CM

## 2010-12-02 ENCOUNTER — Ambulatory Visit
Admission: RE | Admit: 2010-12-02 | Discharge: 2010-12-02 | Disposition: A | Discharge status: Home / Self Care | Payer: 59 | Source: Ambulatory Visit | Attending: Family Medicine | Admitting: Family Medicine

## 2010-12-02 ENCOUNTER — Other Ambulatory Visit: Payer: Self-pay | Admitting: Family Medicine

## 2010-12-02 DIAGNOSIS — N63 Unspecified lump in unspecified breast: Secondary | ICD-10-CM

## 2011-03-24 ENCOUNTER — Other Ambulatory Visit: Payer: Self-pay | Admitting: Family Medicine

## 2011-03-24 DIAGNOSIS — N63 Unspecified lump in unspecified breast: Secondary | ICD-10-CM

## 2011-04-03 ENCOUNTER — Emergency Department (HOSPITAL_COMMUNITY): Payer: 59

## 2011-04-03 ENCOUNTER — Emergency Department (HOSPITAL_COMMUNITY)
Admission: EM | Admit: 2011-04-03 | Discharge: 2011-04-03 | Disposition: A | Discharge status: Home / Self Care | Payer: 59 | Attending: Emergency Medicine | Admitting: Emergency Medicine

## 2011-04-03 ENCOUNTER — Encounter (HOSPITAL_COMMUNITY): Payer: Self-pay | Admitting: Emergency Medicine

## 2011-04-03 DIAGNOSIS — R1011 Right upper quadrant pain: Secondary | ICD-10-CM | POA: Insufficient documentation

## 2011-04-03 DIAGNOSIS — R1013 Epigastric pain: Secondary | ICD-10-CM

## 2011-04-03 DIAGNOSIS — IMO0001 Reserved for inherently not codable concepts without codable children: Secondary | ICD-10-CM | POA: Insufficient documentation

## 2011-04-03 DIAGNOSIS — D72829 Elevated white blood cell count, unspecified: Secondary | ICD-10-CM | POA: Insufficient documentation

## 2011-04-03 DIAGNOSIS — Z87442 Personal history of urinary calculi: Secondary | ICD-10-CM | POA: Insufficient documentation

## 2011-04-03 DIAGNOSIS — R1110 Vomiting, unspecified: Secondary | ICD-10-CM | POA: Insufficient documentation

## 2011-04-03 LAB — COMPREHENSIVE METABOLIC PANEL
ALT: 28 U/L (ref 0–35)
AST: 16 U/L (ref 0–37)
Albumin: 3.9 g/dL (ref 3.5–5.2)
Alkaline Phosphatase: 62 U/L (ref 39–117)
Calcium: 9.2 mg/dL (ref 8.4–10.5)
GFR calc Af Amer: >90 mL/min (ref >90)
Potassium: 3.3 mEq/L — ABNORMAL LOW (ref 3.5–5.1)
Sodium: 135 mEq/L (ref 135–145)
Total Protein: 7.3 g/dL (ref 6.0–8.3)

## 2011-04-03 LAB — DIFFERENTIAL
Basophils Absolute: 0 10*3/uL (ref 0.0–0.1)
Basophils Relative: 0 % (ref 0–1)
Eosinophils Absolute: 0.1 10*3/uL (ref 0.0–0.7)
Eosinophils Relative: 1 % (ref 0–5)
Neutrophils Relative %: 83 % — ABNORMAL HIGH (ref 43–77)

## 2011-04-03 LAB — URINALYSIS, ROUTINE W REFLEX MICROSCOPIC
Bilirubin Urine: NEGATIVE
Glucose, UA: NEGATIVE mg/dL
Nitrite: NEGATIVE
Specific Gravity, Urine: 1.022 (ref 1.005–1.030)
pH: 7.5 (ref 5.0–8.0)

## 2011-04-03 LAB — CBC
MCH: 26 pg (ref 26.0–34.0)
MCV: 77.3 fL — ABNORMAL LOW (ref 78.0–100.0)
Platelets: 200 10*3/uL (ref 150–400)
RBC: 5.43 MIL/uL — ABNORMAL HIGH (ref 3.87–5.11)
RDW: 14.9 % (ref 11.5–15.5)

## 2011-04-03 LAB — POCT PREGNANCY, URINE: Preg Test, Ur: NEGATIVE

## 2011-04-03 LAB — URINE MICROSCOPIC-ADD ON

## 2011-04-03 MED ORDER — SODIUM CHLORIDE 0.9 % IV SOLN
1000.0000 mL | Freq: Once | INTRAVENOUS | Status: AC
  Administered 2011-04-03: 1000 mL via INTRAVENOUS

## 2011-04-03 MED ORDER — ONDANSETRON HCL 4 MG/2ML IJ SOLN
4.0000 mg | Freq: Once | INTRAMUSCULAR | Status: AC
  Administered 2011-04-03: 4 mg via INTRAVENOUS

## 2011-04-03 MED ORDER — FENTANYL CITRATE 0.05 MG/ML IJ SOLN
50.0000 ug | Freq: Once | INTRAMUSCULAR | Status: AC
  Administered 2011-04-03: 50 ug via INTRAVENOUS

## 2011-04-03 MED ORDER — FENTANYL CITRATE 0.05 MG/ML IJ SOLN
50.0000 ug | Freq: Once | INTRAMUSCULAR | Status: AC
  Administered 2011-04-03: 50 ug via INTRAVENOUS
  Filled 2011-04-03: qty 2

## 2011-04-03 MED ORDER — ONDANSETRON HCL 4 MG/2ML IJ SOLN
INTRAMUSCULAR | Status: AC
  Filled 2011-04-03: qty 2

## 2011-04-03 MED ORDER — IOHEXOL 300 MG/ML  SOLN
100.0000 mL | Freq: Once | INTRAMUSCULAR | Status: AC | PRN
  Administered 2011-04-03: 100 mL via INTRAVENOUS

## 2011-04-03 MED ORDER — SODIUM CHLORIDE 0.9 % IV SOLN
1000.0000 mL | INTRAVENOUS | Status: DC
  Administered 2011-04-03: 1000 mL via INTRAVENOUS

## 2011-04-03 MED ORDER — PROMETHAZINE HCL 25 MG/ML IJ SOLN
25.0000 mg | Freq: Once | INTRAMUSCULAR | Status: AC
Start: 2011-04-03 — End: 2011-04-03
  Administered 2011-04-03: 25 mg via INTRAVENOUS
  Filled 2011-04-03 (×2): qty 1

## 2011-04-03 MED ORDER — ONDANSETRON HCL 4 MG PO TABS: 4.0000 mg | ORAL_TABLET | Freq: Four times a day (QID) | ORAL | Status: AC

## 2011-04-03 MED ORDER — ONDANSETRON HCL 4 MG/2ML IJ SOLN
4.0000 mg | Freq: Once | INTRAMUSCULAR | Status: AC
  Administered 2011-04-03: 4 mg via INTRAVENOUS
  Filled 2011-04-03: qty 2

## 2011-04-03 MED ORDER — FENTANYL CITRATE 0.05 MG/ML IJ SOLN
INTRAMUSCULAR | Status: AC
  Filled 2011-04-03: qty 2

## 2011-04-03 MED ORDER — OMEPRAZOLE 20 MG PO CPDR: 20.0000 mg | DELAYED_RELEASE_CAPSULE | Freq: Every day | ORAL | Status: DC

## 2011-04-03 NOTE — ED Provider Notes (Signed)
Assumed care from Dr. Lynelle Doctor.  Reviewed h&p and reexamined pt  Agree with assessment and eval.  Pt pain free now.  Will release.   Nicholes Stairs, MD 04/03/11 1136

## 2011-04-03 NOTE — ED Notes (Signed)
Vital signs stable. 

## 2011-04-03 NOTE — Discharge Instructions (Signed)
Your CAT scan, and blood tests do not show any significant illness.  Use Zofran to prevent nausea and vomiting and is set to reduce acid in her stomach to control pain.  Followup with your Dr. for reevaluation.  If your symptoms persist despite taking these medicines.  Return for worse or uncontrolled symptoms

## 2011-04-03 NOTE — ED Notes (Signed)
Patient reports progressive RUQ ABD pain and N/V that began yesterday afternoon. Reports she started feeling nauseated with a little abdominal cramping prior to eating dinner last night and then immediately vomited all of her food.  Reports the N/V continued throughout the night until this morning, while she is now dry heaving. Also patient reports seeing small bright red blood clots in emesis early this AM. None noted since arrival.

## 2011-04-03 NOTE — ED Notes (Signed)
Patient is resting comfortably. 

## 2011-04-03 NOTE — ED Notes (Signed)
MD at bedside. 

## 2011-04-03 NOTE — ED Notes (Signed)
Patient transported to X-ray 

## 2011-04-03 NOTE — ED Notes (Signed)
Pt c/o of n/v since last night at PM. Pt reports seeing dark "bloody vomit". Pt denies diarrhea.

## 2011-04-03 NOTE — ED Provider Notes (Signed)
History     CSN: [id number]  Arrival date & time 04/03/11  0504   First MD Initiated Contact with Patient 04/03/11 [phone]      No chief complaint on file.   HPI Patient presents emergency room with complaints of nausea vomiting and abdominal pain. Patient states she started having some nausea this evening before they went out to dinner. Following that she started developing vomiting and abdominal pain in the upper abdomen. Patient states she's vomited several times and thought he noticed some blood in her emesis. She has not had any diarrhea or bloody stools. She has vomited since that time and now the color is yellow green without blood. Patient does have history of kidney stones as well as liver inflammation. Movement and palpation makes the pain worse. It is moderate to severe in nature Past Medical History  Diagnosis Date  . Kidney stones   . Migraines   . Raynaud disease   . Fibromyalgia   . Kidney stones   . Raynaud's disease     Past Surgical History  Procedure Date  . Tubal ligation   . Laparoscopy   . Tubal ligation   . Kidney stint     No family history on file.  History  Substance Use Topics  . Smoking status: Never Smoker   . Smokeless tobacco: Not on file  . Alcohol Use: No    OB History    Grav Para Term Preterm Abortions TAB SAB Ect Mult Living   3 2 2  1  1   3       Review of Systems  All other systems reviewed and are negative.    Allergies  Latex and Morphine and related  Home Medications   Current Outpatient Rx  Name Route Sig Dispense Refill  . ONDANSETRON 8 MG PO TBDP Oral Take 8 mg by mouth every 4 (four) hours as needed.      . OXYCODONE HCL (ABUSE DETER) 5 MG PO TABS Oral Take 2 tablets by mouth every 4 (four) hours as needed. For pain       BP 147/80  Pulse 105  Temp 99.1 F (37.3 C)  Resp 22  SpO2 98%  LMP 03/31/2011  Physical Exam  Nursing note and vitals reviewed. Constitutional: She appears well-developed and  well-nourished.  HENT:  Head: Normocephalic and atraumatic.  Right Ear: External ear normal.  Left Ear: External ear normal.  Eyes: Conjunctivae are normal. Right eye exhibits no discharge. Left eye exhibits no discharge. No scleral icterus.  Neck: Neck supple. No tracheal deviation present.  Cardiovascular: Normal rate, regular rhythm and intact distal pulses.   Pulmonary/Chest: Effort normal and breath sounds normal. No stridor. No respiratory distress. She has no wheezes. She has no rales.  Abdominal: Soft. Bowel sounds are normal. She exhibits no distension. There is tenderness in the right upper quadrant and epigastric area. There is no rigidity, no rebound, no guarding and no CVA tenderness. No hernia.  Musculoskeletal: She exhibits no edema and no tenderness.  Neurological: She is alert. She has normal strength. No sensory deficit. Cranial nerve deficit:  no gross defecits noted. She exhibits normal muscle tone. She displays no seizure activity. Coordination normal.  Skin: Skin is warm and dry. No rash noted.  Psychiatric: She has a normal mood and affect.    ED Course  Procedures (including critical care time)  Labs Reviewed  CBC - Abnormal; Notable for the following:    WBC 11.9 (*)  RBC 5.43 (*)    MCV 77.3 (*)    All other components within normal limits  DIFFERENTIAL - Abnormal; Notable for the following:    Neutrophils Relative 83 (*)    Neutro Abs 9.8 (*)    Lymphocytes Relative 8 (*)    All other components within normal limits  COMPREHENSIVE METABOLIC PANEL - Abnormal; Notable for the following:    Potassium 3.3 (*)    Glucose, Bld 107 (*)    Total Bilirubin 1.3 (*)    All other components within normal limits  LIPASE, BLOOD  POCT PREGNANCY, URINE  URINALYSIS, ROUTINE W REFLEX MICROSCOPIC   Dg Abd Acute W/chest  04/03/2011  *RADIOLOGY REPORT*  Clinical Data: Abdominal pain, vomiting and bloating.  ACUTE ABDOMEN SERIES (ABDOMEN 2 VIEW & CHEST 1 VIEW)   Comparison: CT of the abdomen and pelvis performed 08/29/2010  Findings: The lungs are well-aerated and clear.  There is no evidence of focal opacification, pleural effusion or pneumothorax. The cardiomediastinal silhouette is within normal limits.  The visualized bowel gas pattern is unremarkable. Stool and air are noted throughout the colon; there is no evidence of small bowel dilatation to suggest obstruction.  No free intra-abdominal air is identified on the provided upright view.  No acute osseous abnormalities are seen; the sacroiliac joints are unremarkable in appearance.  IMPRESSION:  1.  Unremarkable bowel gas pattern; no free intra-abdominal air seen. 2.  No acute cardiopulmonary process identified.  Original Report Authenticated By: Tonia Ghent, M.D.     No diagnosis found.    MDM  Patient's x-ray does not show any evidence of abdominal obstruction. She has a slight leukocytosis but otherwise her laboratory tests.  7:21 AM on repeat exam patient still having tenderness in her epigastrium. Considering her persistent tenderness in the severity of her symptoms I will order a CT abdomen and pelvis to evaluate further        Celene Kras, MD 04/03/11 [phone]

## 2011-04-05 LAB — URINE CULTURE: Culture  Setup Time: 201302221543

## 2011-04-06 NOTE — ED Notes (Signed)
+   Urine Chart sent to EDP office for review. 

## 2011-04-08 NOTE — ED Notes (Signed)
Rx for Cipro 5200 mg 1 po BID x 3 days #6 tabs.

## 2011-04-08 NOTE — ED Notes (Signed)
Pt called back and was notified of urine contaminate; pt asked if we could call her Rx into [facility] at [phone]. So, Cipro 500mg  PO BID x 3 days was called into pharmacy.  Pt aware to get Rx.

## 2011-05-16 ENCOUNTER — Telehealth: Payer: Self-pay

## 2011-05-16 ENCOUNTER — Ambulatory Visit (INDEPENDENT_AMBULATORY_CARE_PROVIDER_SITE_OTHER): Payer: 59 | Admitting: Family Medicine

## 2011-05-16 VITALS — BP 126/70 | HR 76 | Temp 98.2°F | Resp 16

## 2011-05-16 DIAGNOSIS — M779 Enthesopathy, unspecified: Secondary | ICD-10-CM

## 2011-05-16 DIAGNOSIS — M797 Fibromyalgia: Secondary | ICD-10-CM | POA: Insufficient documentation

## 2011-05-16 DIAGNOSIS — M7750 Other enthesopathy of unspecified foot: Secondary | ICD-10-CM

## 2011-05-16 MED ORDER — PREDNISONE 20 MG PO TABS
ORAL_TABLET | ORAL | Status: DC
Start: 2011-05-16 — End: 2011-09-11

## 2011-05-16 MED ORDER — HYDROCODONE-ACETAMINOPHEN 5-500 MG PO TABS: 1.0000 | ORAL_TABLET | Freq: Three times a day (TID) | ORAL | Status: AC | PRN

## 2011-05-16 NOTE — Patient Instructions (Signed)
Tendinitis  Tendinitis is swelling and inflammation of the tendons. Tendons are band-like tissues that connect muscle to bone. Tendinitis commonly occurs in the:    Shoulders (rotator cuff).   Heels (Achilles tendon).   Elbows (triceps tendon).  CAUSES  Tendinitis is usually caused by overusing the tendon, muscles, and joints involved. When the tissue surrounding a tendon (synovium) becomes inflamed, it is called tenosynovitis. Tendinitis commonly develops in people whose jobs require repetitive motions.  SYMPTOMS   Pain.   Tenderness.   Mild swelling.  DIAGNOSIS  Tendinitis is usually diagnosed by physical exam. Your caregiver may also order X-rays or other imaging tests.  TREATMENT  Your caregiver may recommend certain medicines or exercises for your treatment.  HOME CARE INSTRUCTIONS    Use a sling or splint for as long as directed by your caregiver until the pain decreases.   Put ice on the injured area.   Put ice in a plastic bag.   Place a towel between your skin and the bag.   Leave the ice on for 15 to 20 minutes, 3 to 4 times a day.   Avoid using the limb while the tendon is painful. Perform gentle range of motion exercises only as directed by your caregiver. Stop exercises if pain or discomfort increase, unless directed otherwise by your caregiver.   Only take over-the-counter or prescription medicines for pain, discomfort, or fever as directed by your caregiver.  SEEK MEDICAL CARE IF:    Your pain and swelling increase.   You develop new, unexplained symptoms, especially increased numbness in the hands.  MAKE SURE YOU:    Understand these instructions.   Will watch your condition.   Will get help right away if you are not doing well or get worse.  Document Released: 01/24/2000 Document Revised: 01/15/2011 Document Reviewed: 04/14/2010  [employer] Patient Information 2012 [employer].

## 2011-05-16 NOTE — Progress Notes (Signed)
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  All but foot have cleared up re: pain.  Awoke this morning with severe left dorsal foot pain with palpation or movement, no swellling.  O:  Left foot:  No STS or ecchymosis or bony abn Very tender extensor hallux longus with pain on passive flexion of great toe.  A:  Tendonitis  P:  Prednisone and vicodin

## 2011-05-18 ENCOUNTER — Other Ambulatory Visit: Payer: 59

## 2011-06-09 ENCOUNTER — Other Ambulatory Visit: Payer: Self-pay | Admitting: Family Medicine

## 2011-06-10 ENCOUNTER — Ambulatory Visit
Admission: RE | Admit: 2011-06-10 | Discharge: 2011-06-10 | Disposition: A | Discharge status: Home / Self Care | Payer: 59 | Source: Ambulatory Visit | Attending: Family Medicine | Admitting: Family Medicine

## 2011-06-10 ENCOUNTER — Other Ambulatory Visit: Payer: Self-pay | Admitting: Family Medicine

## 2011-07-14 ENCOUNTER — Telehealth: Payer: Self-pay

## 2011-07-14 NOTE — Telephone Encounter (Signed)
PT WOULD LIKE TO COME BY AND P/U A COPY OF THE RECORD WHERE SHE HAD A TETANUS SHOT. WAS TOLD IT COULD TAKE 48-72 HRS PLEASE CALL [phone]

## 2011-07-14 NOTE — Telephone Encounter (Signed)
Printed out phone message because the patient last Tetanus was done in the old system.

## 2011-09-11 ENCOUNTER — Encounter (HOSPITAL_COMMUNITY): Payer: Self-pay

## 2011-09-11 ENCOUNTER — Emergency Department (HOSPITAL_COMMUNITY): Payer: 59

## 2011-09-11 ENCOUNTER — Emergency Department (HOSPITAL_COMMUNITY)
Admission: EM | Admit: 2011-09-11 | Discharge: 2011-09-12 | Disposition: A | Discharge status: Home / Self Care | Payer: 59 | Attending: Emergency Medicine | Admitting: Emergency Medicine

## 2011-09-11 DIAGNOSIS — N83209 Unspecified ovarian cyst, unspecified side: Secondary | ICD-10-CM | POA: Insufficient documentation

## 2011-09-11 DIAGNOSIS — R109 Unspecified abdominal pain: Secondary | ICD-10-CM | POA: Insufficient documentation

## 2011-09-11 DIAGNOSIS — Z87442 Personal history of urinary calculi: Secondary | ICD-10-CM | POA: Insufficient documentation

## 2011-09-11 DIAGNOSIS — N390 Urinary tract infection, site not specified: Secondary | ICD-10-CM | POA: Insufficient documentation

## 2011-09-11 LAB — URINALYSIS, ROUTINE W REFLEX MICROSCOPIC
Bilirubin Urine: NEGATIVE
Glucose, UA: NEGATIVE mg/dL
Ketones, ur: NEGATIVE mg/dL
Protein, ur: NEGATIVE mg/dL
Urobilinogen, UA: 0.2 mg/dL (ref 0.0–1.0)

## 2011-09-11 LAB — CBC WITH DIFFERENTIAL/PLATELET
Eosinophils Absolute: 0.1 10*3/uL (ref 0.0–0.7)
Hemoglobin: 13 g/dL (ref 12.0–15.0)
Lymphs Abs: 1.8 10*3/uL (ref 0.7–4.0)
MCH: 26.4 pg (ref 26.0–34.0)
Monocytes Relative: 10 % (ref 3–12)
Neutro Abs: 2.2 10*3/uL (ref 1.7–7.7)
Neutrophils Relative %: 48 % (ref 43–77)
Platelets: 189 10*3/uL (ref 150–400)
RBC: 4.92 MIL/uL (ref 3.87–5.11)
WBC: 4.6 10*3/uL (ref 4.0–10.5)

## 2011-09-11 LAB — COMPREHENSIVE METABOLIC PANEL
ALT: 10 U/L (ref 0–35)
Albumin: 4.3 g/dL (ref 3.5–5.2)
Alkaline Phosphatase: 47 U/L (ref 39–117)
BUN: 12 mg/dL (ref 6–23)
Chloride: 98 mEq/L (ref 96–112)
GFR calc Af Amer: >90 mL/min (ref >90)
Glucose, Bld: 83 mg/dL (ref 70–99)
Potassium: 3.6 mEq/L (ref 3.5–5.1)
Sodium: 134 mEq/L — ABNORMAL LOW (ref 135–145)
Total Bilirubin: 0.6 mg/dL (ref 0.3–1.2)
Total Protein: 7.2 g/dL (ref 6.0–8.3)

## 2011-09-11 LAB — URINE MICROSCOPIC-ADD ON

## 2011-09-11 LAB — POCT PREGNANCY, URINE: Preg Test, Ur: NEGATIVE

## 2011-09-11 MED ORDER — FENTANYL CITRATE 0.05 MG/ML IJ SOLN
50.0000 ug | Freq: Once | INTRAMUSCULAR | Status: AC
  Administered 2011-09-11: 50 ug via INTRAVENOUS
  Filled 2011-09-11: qty 2

## 2011-09-11 MED ORDER — SODIUM CHLORIDE 0.9 % IV SOLN
Freq: Once | INTRAVENOUS | Status: AC
  Administered 2011-09-11: 23:00:00 via INTRAVENOUS

## 2011-09-11 MED ORDER — ONDANSETRON HCL 4 MG/2ML IJ SOLN
4.0000 mg | Freq: Once | INTRAMUSCULAR | Status: AC
  Administered 2011-09-11: 4 mg via INTRAVENOUS
  Filled 2011-09-11: qty 2

## 2011-09-11 NOTE — ED Notes (Signed)
Pt presents with no acute distress- seen by PCP today- c/o of UTI and rt flank pain radiates into rt groin.  Pt states passed kidney stone 1 week ago- now has UTI- Pt presents with increased pain and blood in urine.  Pt is able to urinate.

## 2011-09-11 NOTE — ED Provider Notes (Signed)
History     CSN: [id number]  Arrival date & time 09/11/11  4098   First MD Initiated Contact with Patient 09/11/11 2152      Chief Complaint  Patient presents with  . Flank Pain  . Urinary Tract Infection    (Consider location/radiation/quality/duration/timing/severity/associated sxs/prior treatment) HPI Comments: Patient states she was diagnosed with UTI at urgent care with a urinary dipstick.  She was started on Cipro and has been taking ibuprofen for discomfort.  Today.  The pain is worse.  She has decreased urination, nausea, dizziness, and right sided pain.  She does have a history of multiple right-sided renal calculi.  She has been able to pass most of these except on 2 occasions.  She has had a tubal ligation, and cholecystectomy, she does have her appendix, she also has a history of an  specified liver disease.  Patient is a 26 y.o. female presenting with flank pain and urinary tract infection. The history is provided by the patient.  Flank Pain This is a new problem. The current episode started in the past 7 days. The problem occurs constantly. The problem has been gradually worsening. Associated symptoms include abdominal pain, anorexia, nausea, urinary symptoms and weakness. Pertinent negatives include no chills, fever, headaches, rash or vomiting. Nothing aggravates the symptoms. She has tried nothing for the symptoms.  Urinary Tract Infection Associated symptoms include abdominal pain, anorexia, nausea, urinary symptoms and weakness. Pertinent negatives include no chills, fever, headaches, rash or vomiting.    Past Medical History  Diagnosis Date  . Kidney stones   . Migraines   . Raynaud disease   . Fibromyalgia   . Kidney stones   . Raynaud's disease   . Kidney stones     Past Surgical History  Procedure Date  . Tubal ligation   . Laparoscopy   . Tubal ligation   . Kidney stint   . Cholecystectomy     No family history on file.  History  Substance Use  Topics  . Smoking status: Never Smoker   . Smokeless tobacco: Not on file  . Alcohol Use: No    OB History    Grav Para Term Preterm Abortions TAB SAB Ect Mult Living   3 2 2  1  1   3       Review of Systems  Constitutional: Negative for fever and chills.  HENT: Negative for sinus pressure.   Respiratory: Negative for shortness of breath.   Gastrointestinal: Positive for nausea, abdominal pain and anorexia. Negative for vomiting and constipation.  Genitourinary: Positive for flank pain and decreased urine volume. Negative for dysuria, frequency, vaginal discharge, vaginal pain and pelvic pain.  Skin: Negative for rash.  Neurological: Positive for dizziness and weakness. Negative for headaches.    Allergies  Latex; Morphine and related; Terbutaline sulfate; and Vitamin a  Home Medications   Current Outpatient Rx  Name Route Sig Dispense Refill  . BIOTIN 5000 PO Oral Take 1 tablet by mouth daily.    Marland Kitchen CALCIUM 600+D PO Oral Take 1 tablet by mouth daily.    Marland Kitchen VITAMIN D 1000 UNITS PO TABS Oral Take 2,000 Units by mouth daily.    Marland Kitchen CIPROFLOXACIN HCL 250 MG PO TABS Oral Take 250 mg by mouth 2 (two) times daily. Started on 09-09-11 patient is to take for 1 week.    Marland Kitchen COENZYME Q10 30 MG PO CAPS Oral Take 30 mg by mouth daily.    Marland Kitchen FERROUS SULFATE 325 (65 FE)  MG PO TABS Oral Take 325 mg by mouth daily with breakfast.    . HYDROCODONE-ACETAMINOPHEN 5-500 MG PO TABS Oral Take 1 tablet by mouth every 6 (six) hours as needed. For pain    . VITAMIN B-12 100 MCG PO TABS Oral Take 50 mcg by mouth daily.    Marland Kitchen HYDROCODONE-ACETAMINOPHEN 5-325 MG PO TABS Oral Take 1 tablet by mouth every 6 (six) hours as needed for pain. 30 tablet 0    BP 130/80  Pulse 66  Resp 18  Ht 5\' 7"  (1.702 m)  Wt 136 lb (61.689 kg)  BMI 21.30 kg/m2  SpO2 100%  LMP 08/24/2011  Physical Exam  Constitutional: She appears well-developed and well-nourished.  HENT:  Head: Normocephalic.  Neck: Normal range of motion.   Cardiovascular: Normal rate.   Pulmonary/Chest: Effort normal.  Abdominal: She exhibits no distension. There is no tenderness.  Musculoskeletal: Normal range of motion.  Neurological: She is alert.  Skin: Skin is warm and dry. No rash noted.    ED Course  Procedures (including critical care time)  Labs Reviewed  URINALYSIS, ROUTINE W REFLEX MICROSCOPIC - Abnormal; Notable for the following:    APPearance TURBID (*)     Hgb urine dipstick LARGE (*)     Leukocytes, UA MODERATE (*)     All other components within normal limits  URINE MICROSCOPIC-ADD ON - Abnormal; Notable for the following:    Squamous Epithelial / LPF MANY (*)     Bacteria, UA MANY (*)     All other components within normal limits  COMPREHENSIVE METABOLIC PANEL - Abnormal; Notable for the following:    Sodium 134 (*)     All other components within normal limits  POCT PREGNANCY, URINE  CBC WITH DIFFERENTIAL   Ct Abdomen Pelvis Wo Contrast  09/11/2011  *RADIOLOGY REPORT*  Clinical Data: Right flank, abdominal and pelvic pain.  CT ABDOMEN AND PELVIS WITHOUT CONTRAST  Technique:  Multidetector CT imaging of the abdomen and pelvis was performed following the standard protocol without intravenous contrast.  Comparison: 04/03/2011 CT  Findings: The liver, spleen, pancreas, adrenal glands, gallbladder and kidneys are unremarkable.  There is no evidence of hydronephrosis or urinary calculi.  Please note that parenchymal abnormalities may be missed as intravenous contrast was not administered.  A small amount of free fluid in the pelvis is noted.  A 2.6 x 2.8 cm right ovarian/adnexal lesion is identified and may represent a cyst. The bowel, appendix and bladder are unremarkable.  There is no evidence of biliary dilatation, enlarged lymph nodes or abdominal aortic aneurysm.  No acute or suspicious bony abnormalities are identified.  IMPRESSION: 2.5 cm right ovarian/adnexal cystic lesion - may be hemorrhagic. Small amount of free  pelvic fluid. Consider pelvic ultrasound for further evaluation.  No other significant abnormalities identified.  No evidence of hydronephrosis or urinary calculi.  Original Report Authenticated By: Rosendo Gros, M.D.     1. Ovarian cyst   2. UTI (lower urinary tract infection)       MDM   Urine is positive for UTI, will check renal function, liver enzymes, and CT Scan for additional renal/kidney stones         Arman Filter, NP 09/12/11 [phone]

## 2011-09-12 MED ORDER — KETOROLAC TROMETHAMINE 30 MG/ML IJ SOLN
30.0000 mg | Freq: Once | INTRAMUSCULAR | Status: AC
  Administered 2011-09-12: 30 mg via INTRAVENOUS
  Filled 2011-09-12: qty 1

## 2011-09-12 MED ORDER — HYDROCODONE-ACETAMINOPHEN 5-325 MG PO TABS: 1.0000 | ORAL_TABLET | Freq: Four times a day (QID) | ORAL | Status: DC | PRN

## 2011-09-12 MED ORDER — ONDANSETRON HCL 4 MG/2ML IJ SOLN
4.0000 mg | Freq: Once | INTRAMUSCULAR | Status: AC
  Administered 2011-09-12: 4 mg via INTRAVENOUS
  Filled 2011-09-12: qty 2

## 2011-09-12 MED ORDER — HYDROCODONE-ACETAMINOPHEN 5-325 MG PO TABS
1.0000 | ORAL_TABLET | Freq: Once | ORAL | Status: AC
  Administered 2011-09-12: 1 via ORAL
  Filled 2011-09-12: qty 1

## 2011-09-12 NOTE — ED Provider Notes (Signed)
Medical screening examination/treatment/procedure(s) were performed by non-physician practitioner and as supervising physician I was immediately available for consultation/collaboration.  Doug Sou, MD 09/12/11 1454

## 2011-09-12 NOTE — ED Notes (Signed)
Pt d/c with family v/s stable will monitor

## 2011-09-13 ENCOUNTER — Emergency Department (HOSPITAL_COMMUNITY): Payer: 59

## 2011-09-13 ENCOUNTER — Encounter (HOSPITAL_COMMUNITY): Payer: Self-pay | Admitting: *Deleted

## 2011-09-13 ENCOUNTER — Emergency Department (HOSPITAL_COMMUNITY)
Admission: EM | Admit: 2011-09-13 | Discharge: 2011-09-14 | Disposition: A | Discharge status: Home / Self Care | Payer: 59 | Attending: Emergency Medicine | Admitting: Emergency Medicine

## 2011-09-13 DIAGNOSIS — Z79899 Other long term (current) drug therapy: Secondary | ICD-10-CM | POA: Insufficient documentation

## 2011-09-13 DIAGNOSIS — R51 Headache: Secondary | ICD-10-CM | POA: Insufficient documentation

## 2011-09-13 DIAGNOSIS — N83209 Unspecified ovarian cyst, unspecified side: Secondary | ICD-10-CM

## 2011-09-13 DIAGNOSIS — M549 Dorsalgia, unspecified: Secondary | ICD-10-CM | POA: Insufficient documentation

## 2011-09-13 DIAGNOSIS — R110 Nausea: Secondary | ICD-10-CM | POA: Insufficient documentation

## 2011-09-13 DIAGNOSIS — Z9089 Acquired absence of other organs: Secondary | ICD-10-CM | POA: Insufficient documentation

## 2011-09-13 DIAGNOSIS — IMO0001 Reserved for inherently not codable concepts without codable children: Secondary | ICD-10-CM | POA: Insufficient documentation

## 2011-09-13 LAB — URINALYSIS, ROUTINE W REFLEX MICROSCOPIC
Protein, ur: NEGATIVE mg/dL
Specific Gravity, Urine: 1.015 (ref 1.005–1.030)
Urobilinogen, UA: 0.2 mg/dL (ref 0.0–1.0)

## 2011-09-13 LAB — URINE MICROSCOPIC-ADD ON

## 2011-09-13 MED ORDER — OXYCODONE-ACETAMINOPHEN 5-325 MG PO TABS: 1.0000 | ORAL_TABLET | Freq: Four times a day (QID) | ORAL | Status: AC | PRN

## 2011-09-13 MED ORDER — SODIUM CHLORIDE 0.9 % IV BOLUS (SEPSIS)
500.0000 mL | Freq: Once | INTRAVENOUS | Status: AC
  Administered 2011-09-13: 500 mL via INTRAVENOUS

## 2011-09-13 MED ORDER — ONDANSETRON HCL 4 MG/2ML IJ SOLN
4.0000 mg | Freq: Once | INTRAMUSCULAR | Status: AC
  Administered 2011-09-13: 4 mg via INTRAVENOUS
  Filled 2011-09-13: qty 2

## 2011-09-13 MED ORDER — FENTANYL CITRATE 0.05 MG/ML IJ SOLN
50.0000 ug | Freq: Once | INTRAMUSCULAR | Status: AC
  Administered 2011-09-13: 50 ug via INTRAVENOUS
  Filled 2011-09-13: qty 2

## 2011-09-13 MED ORDER — PROMETHAZINE HCL 25 MG/ML IJ SOLN
25.0000 mg | Freq: Once | INTRAMUSCULAR | Status: AC
  Administered 2011-09-13: 25 mg via INTRAVENOUS
  Filled 2011-09-13: qty 1

## 2011-09-13 NOTE — ED Notes (Signed)
Pt reports right abdominal pain that is sharp. Pt also reporting swelling to lower right abdomen and dull pain to right flank and back. Pt also reports nausea without vomiting and headache.  Pt states pain is the same as when she was seen on 8/2.  Pt has been taking Vicodin at home without relief.

## 2011-09-13 NOTE — ED Notes (Signed)
WUJ:[id number]<JY> Expected date:09/13/11<BR> Expected time: 6:35 PM<BR> Means of arrival:<BR> Comments:<BR> Hold for triage

## 2011-09-13 NOTE — ED Provider Notes (Signed)
History     CSN: [id number]  Arrival date & time 09/13/11  1637   First MD Initiated Contact with Patient 09/13/11 1837      Chief Complaint  Patient presents with  . Abdominal Pain  . Nausea  . Back Pain  . Flank Pain    (Consider location/radiation/quality/duration/timing/severity/associated sxs/prior treatment) Patient is a 26 y.o. female presenting with abdominal pain, back pain, and flank pain. The history is provided by the patient.  Abdominal Pain The primary symptoms of the illness include abdominal pain, fatigue and nausea. The primary symptoms of the illness do not include fever, shortness of breath, vomiting, diarrhea, vaginal discharge or vaginal bleeding.  Additional symptoms associated with the illness include back pain. Symptoms associated with the illness do not include chills.  Back Pain  Associated symptoms include headaches and abdominal pain. Pertinent negatives include no chest pain, no fever, no numbness, no pelvic pain and no weakness.  Flank Pain Associated symptoms include abdominal pain and headaches. Pertinent negatives include no chest pain and no shortness of breath.   patient has had right-sided abdominal pain for last couple days. She was seen in ER yesterday and had urinalysis that showed a UTI and had a CT that showed a right adnexal mass presumably ovarian cyst. She states since then her pain has increased and she her abdomen feels more full. No dysuria. No fevers. She states the Vicodin has not relieved the pain.  Past Medical History  Diagnosis Date  . Kidney stones   . Migraines   . Raynaud disease   . Fibromyalgia   . Kidney stones   . Raynaud's disease   . Kidney stones     Past Surgical History  Procedure Date  . Tubal ligation   . Laparoscopy   . Tubal ligation   . Kidney stint   . Cholecystectomy     No family history on file.  History  Substance Use Topics  . Smoking status: Never Smoker   . Smokeless tobacco: Not on file    . Alcohol Use: No    OB History    Grav Para Term Preterm Abortions TAB SAB Ect Mult Living   3 2 2  1  1   3       Review of Systems  Constitutional: Positive for fatigue. Negative for fever, chills, activity change and appetite change.  HENT: Negative for neck stiffness.   Eyes: Negative for pain.  Respiratory: Negative for chest tightness and shortness of breath.   Cardiovascular: Negative for chest pain and leg swelling.  Gastrointestinal: Positive for nausea and abdominal pain. Negative for vomiting and diarrhea.  Genitourinary: Positive for flank pain. Negative for vaginal bleeding, vaginal discharge and pelvic pain.  Musculoskeletal: Positive for back pain.  Skin: Negative for rash.  Neurological: Positive for headaches. Negative for weakness and numbness.  Psychiatric/Behavioral: Negative for behavioral problems.    Allergies  Latex; Morphine and related; Terbutaline sulfate; and Vitamin a  Home Medications   Current Outpatient Rx  Name Route Sig Dispense Refill  . BIOTIN 5000 PO Oral Take 1 tablet by mouth daily.    Marland Kitchen CALCIUM 600+D PO Oral Take 1 tablet by mouth daily.    Marland Kitchen VITAMIN D 1000 UNITS PO TABS Oral Take 2,000 Units by mouth daily.    Marland Kitchen CIPROFLOXACIN HCL 250 MG PO TABS Oral Take 250 mg by mouth 2 (two) times daily. Started on 09-09-11 patient is to take for 1 week.    Marland Kitchen COENZYME  Q10 30 MG PO CAPS Oral Take 30 mg by mouth daily.    Marland Kitchen FERROUS SULFATE 325 (65 FE) MG PO TABS Oral Take 325 mg by mouth daily with breakfast.    . HYDROCODONE-ACETAMINOPHEN 5-325 MG PO TABS Oral Take 1 tablet by mouth every 6 (six) hours as needed.    Marland Kitchen VITAMIN B-12 100 MCG PO TABS Oral Take 50 mcg by mouth daily.    . OXYCODONE-ACETAMINOPHEN 5-325 MG PO TABS Oral Take 1-2 tablets by mouth every 6 (six) hours as needed for pain. 10 tablet 0    BP 110/53  Pulse 68  Temp 98.4 F (36.9 C) (Oral)  Resp 16  SpO2 100%  LMP 08/24/2011  Physical Exam  Nursing note and vitals  reviewed. Constitutional: She is oriented to person, place, and time. She appears well-developed and well-nourished.  HENT:  Head: Normocephalic and atraumatic.  Eyes: EOM are normal. Pupils are equal, round, and reactive to light.  Neck: Normal range of motion. Neck supple.  Cardiovascular: Normal rate, regular rhythm and normal heart sounds.   No murmur heard. Pulmonary/Chest: Effort normal and breath sounds normal. No respiratory distress. She has no wheezes. She has no rales.  Abdominal: Soft. Bowel sounds are normal. She exhibits no distension. There is tenderness. There is no rebound and no guarding.       Moderate tenderness right lower abdomen. No rebound or guarding  Genitourinary:       No CVA tenderness  Musculoskeletal: Normal range of motion.  Neurological: She is alert and oriented to person, place, and time. No cranial nerve deficit.  Skin: Skin is warm and dry.  Psychiatric: She has a normal mood and affect. Her speech is normal.    ED Course  Procedures (including critical care time)  Labs Reviewed  URINALYSIS, ROUTINE W REFLEX MICROSCOPIC - Abnormal; Notable for the following:    APPearance CLOUDY (*)     Ketones, ur TRACE (*)     Leukocytes, UA TRACE (*)     All other components within normal limits  URINE MICROSCOPIC-ADD ON  URINE CULTURE   US Transvaginal Non-ob  09/13/2011  *RADIOLOGY REPORT*  Clinical Data:  Pelvic pain since 09/09/2011.  Right adnexal mass. Negative urine pregnancy test.  TRANSABDOMINAL AND TRANSVAGINAL ULTRASOUND OF PELVIS DOPPLER ULTRASOUND OF OVARIES  Technique:  Both transabdominal and transvaginal ultrasound examinations of the pelvis were performed. Transabdominal technique was performed for global imaging of the pelvis including uterus, ovaries, adnexal regions, and pelvic cul-de-sac.  It was necessary to proceed with endovaginal exam following the transabdominal exam to visualize the endometrium and adnexal structures.  Color and duplex  Doppler ultrasound was utilized to evaluate blood flow to the ovaries.  Comparison:  CT abdomen and pelvis 09/11/2011.  Findings:  Uterus:  The uterus is anteverted and measures 10 x 4.7 x 6.9 cm. Normal myometrial echotexture without focal mass lesion.  Endometrium:  Normal endometrial stripe thickness measured at about 9 mm.  No endometrial fluid collections.  Right ovary: The right ovary measures 3.7 x 2.8 x 2.7 cm.  Dominant height well echoic focus measuring 1.7 x 1.5 x 0.9 cm likely representing an hemorrhagic cyst.  Flow is demonstrated in the right ovary on color flow Doppler images with normal arterial and venous wave forms.  Left ovary:   The left ovary measures 3.2 x 2 x 1.8 cm.  Normal follicular changes.  No abnormal adnexal masses.  Flow is demonstrated in the left ovary on color flow  Doppler imaging with normal arterial and venous flow waveform patterns.  Moderate amount of free fluid in the pelvis extending along the right adnexal region and right lower quadrant.  Pulsed Doppler evaluation demonstrates normal low-resistance arterial and venous waveforms in both ovaries.  IMPRESSION: Normal appearance of the uterus and left ovary.  Small hemorrhagic cyst in the right ovary.  Small to moderate free pelvic fluid.  No sonographic evidence for ovarian torsion.  Original Report Authenticated By: Marlon Pel, M.D.   US Pelvis Complete  09/13/2011  *RADIOLOGY REPORT*  Clinical Data:  Pelvic pain since 09/09/2011.  Right adnexal mass. Negative urine pregnancy test.  TRANSABDOMINAL AND TRANSVAGINAL ULTRASOUND OF PELVIS DOPPLER ULTRASOUND OF OVARIES  Technique:  Both transabdominal and transvaginal ultrasound examinations of the pelvis were performed. Transabdominal technique was performed for global imaging of the pelvis including uterus, ovaries, adnexal regions, and pelvic cul-de-sac.  It was necessary to proceed with endovaginal exam following the transabdominal exam to visualize the endometrium  and adnexal structures.  Color and duplex Doppler ultrasound was utilized to evaluate blood flow to the ovaries.  Comparison:  CT abdomen and pelvis 09/11/2011.  Findings:  Uterus:  The uterus is anteverted and measures 10 x 4.7 x 6.9 cm. Normal myometrial echotexture without focal mass lesion.  Endometrium:  Normal endometrial stripe thickness measured at about 9 mm.  No endometrial fluid collections.  Right ovary: The right ovary measures 3.7 x 2.8 x 2.7 cm.  Dominant height well echoic focus measuring 1.7 x 1.5 x 0.9 cm likely representing an hemorrhagic cyst.  Flow is demonstrated in the right ovary on color flow Doppler images with normal arterial and venous wave forms.  Left ovary:   The left ovary measures 3.2 x 2 x 1.8 cm.  Normal follicular changes.  No abnormal adnexal masses.  Flow is demonstrated in the left ovary on color flow Doppler imaging with normal arterial and venous flow waveform patterns.  Moderate amount of free fluid in the pelvis extending along the right adnexal region and right lower quadrant.  Pulsed Doppler evaluation demonstrates normal low-resistance arterial and venous waveforms in both ovaries.  IMPRESSION: Normal appearance of the uterus and left ovary.  Small hemorrhagic cyst in the right ovary.  Small to moderate free pelvic fluid.  No sonographic evidence for ovarian torsion.  Original Report Authenticated By: Marlon Pel, M.D.   [place] Art/ven Flow Abd Pelv Doppler  09/13/2011  *RADIOLOGY REPORT*  Clinical Data:  Pelvic pain since 09/09/2011.  Right adnexal mass. Negative urine pregnancy test.  TRANSABDOMINAL AND TRANSVAGINAL ULTRASOUND OF PELVIS DOPPLER ULTRASOUND OF OVARIES  Technique:  Both transabdominal and transvaginal ultrasound examinations of the pelvis were performed. Transabdominal technique was performed for global imaging of the pelvis including uterus, ovaries, adnexal regions, and pelvic cul-de-sac.  It was necessary to proceed with endovaginal exam following  the transabdominal exam to visualize the endometrium and adnexal structures.  Color and duplex Doppler ultrasound was utilized to evaluate blood flow to the ovaries.  Comparison:  CT abdomen and pelvis 09/11/2011.  Findings:  Uterus:  The uterus is anteverted and measures 10 x 4.7 x 6.9 cm. Normal myometrial echotexture without focal mass lesion.  Endometrium:  Normal endometrial stripe thickness measured at about 9 mm.  No endometrial fluid collections.  Right ovary: The right ovary measures 3.7 x 2.8 x 2.7 cm.  Dominant height well echoic focus measuring 1.7 x 1.5 x 0.9 cm likely representing an hemorrhagic cyst.  Flow is demonstrated  in the right ovary on color flow Doppler images with normal arterial and venous wave forms.  Left ovary:   The left ovary measures 3.2 x 2 x 1.8 cm.  Normal follicular changes.  No abnormal adnexal masses.  Flow is demonstrated in the left ovary on color flow Doppler imaging with normal arterial and venous flow waveform patterns.  Moderate amount of free fluid in the pelvis extending along the right adnexal region and right lower quadrant.  Pulsed Doppler evaluation demonstrates normal low-resistance arterial and venous waveforms in both ovaries.  IMPRESSION: Normal appearance of the uterus and left ovary.  Small hemorrhagic cyst in the right ovary.  Small to moderate free pelvic fluid.  No sonographic evidence for ovarian torsion.  Original Report Authenticated By: Marlon Pel, M.D.     1. Ovarian cyst rupture       MDM  Patient with right lower abdominal pain. Seen yesterday for same and had CT that showed adnexal mass. Ultrasound was not done that showed right hemorrhagic cyst with free fluid. This likely a ruptured cyst. No free fluid yesterday. This likely accounts for the change in the symptoms. Patient has a urine culture that is now pending. She feels somewhat better after treatment and she was given Phenergan for her headache. She'll followup with her  gynecologist. Her vitals are stable and she does not appear to be volume depleted        Harrold Donath R. Rubin Payor, MD 09/13/11 2318

## 2011-09-13 NOTE — ED Notes (Signed)
Patient given discharge instructions, information, prescriptions, and diet order. Patient states that they adequately understand discharge information given and to return to ED if symptoms return or worsen.     

## 2011-09-15 LAB — URINE CULTURE
Colony Count: NO GROWTH
Culture: NO GROWTH

## 2011-12-02 ENCOUNTER — Other Ambulatory Visit: Payer: Self-pay | Admitting: Family Medicine

## 2011-12-02 DIAGNOSIS — N6452 Nipple discharge: Secondary | ICD-10-CM

## 2011-12-02 DIAGNOSIS — N63 Unspecified lump in unspecified breast: Secondary | ICD-10-CM

## 2011-12-02 DIAGNOSIS — N644 Mastodynia: Secondary | ICD-10-CM

## 2011-12-08 ENCOUNTER — Other Ambulatory Visit: Payer: Self-pay | Admitting: Family Medicine

## 2011-12-08 ENCOUNTER — Ambulatory Visit
Admission: RE | Admit: 2011-12-08 | Discharge: 2011-12-08 | Disposition: A | Discharge status: Home / Self Care | Payer: 59 | Source: Ambulatory Visit | Attending: Family Medicine | Admitting: Family Medicine

## 2011-12-08 DIAGNOSIS — N644 Mastodynia: Secondary | ICD-10-CM

## 2011-12-08 DIAGNOSIS — N63 Unspecified lump in unspecified breast: Secondary | ICD-10-CM

## 2011-12-08 DIAGNOSIS — N6452 Nipple discharge: Secondary | ICD-10-CM

## 2011-12-08 LAB — HM MAMMOGRAPHY

## 2011-12-18 ENCOUNTER — Ambulatory Visit (INDEPENDENT_AMBULATORY_CARE_PROVIDER_SITE_OTHER): Payer: 59 | Admitting: Family Medicine

## 2011-12-18 ENCOUNTER — Ambulatory Visit: Payer: 59

## 2011-12-18 VITALS — BP 100/60 | HR 102 | Temp 97.5°F | Resp 20 | Ht 68.0 in | Wt 132.0 lb

## 2011-12-18 DIAGNOSIS — R05 Cough: Secondary | ICD-10-CM

## 2011-12-18 DIAGNOSIS — R059 Cough, unspecified: Secondary | ICD-10-CM

## 2011-12-18 DIAGNOSIS — J45909 Unspecified asthma, uncomplicated: Secondary | ICD-10-CM

## 2011-12-18 LAB — POCT CBC
Granulocyte percent: 59.4 %G (ref 37–80)
Lymph, poc: 2.2 (ref 0.6–3.4)
MCH, POC: 26.6 pg — AB (ref 27–31.2)
MCHC: 32.1 g/dL (ref 31.8–35.4)
MCV: 83.1 fL (ref 80–97)
MID (cbc): 0.5 (ref 0–0.9)
POC LYMPH PERCENT: 33.2 %L (ref 10–50)
Platelet Count, POC: 252 10*3/uL (ref 142–424)
RDW, POC: 13.3 %

## 2011-12-18 MED ORDER — ALBUTEROL SULFATE (2.5 MG/3ML) 0.083% IN NEBU: 2.5000 mg | INHALATION_SOLUTION | Freq: Once | RESPIRATORY_TRACT | Status: DC

## 2011-12-18 MED ORDER — AZITHROMYCIN 250 MG PO TABS: ORAL_TABLET | ORAL | Status: DC

## 2011-12-18 MED ORDER — ALBUTEROL SULFATE HFA 108 (90 BASE) MCG/ACT IN AERS: 2.0000 | INHALATION_SPRAY | Freq: Four times a day (QID) | RESPIRATORY_TRACT | Status: DC | PRN

## 2011-12-18 MED ORDER — BENZONATATE 100 MG PO CAPS: ORAL_CAPSULE | ORAL | Status: DC

## 2011-12-18 MED ORDER — PREDNISONE 20 MG PO TABS: ORAL_TABLET | ORAL | Status: DC

## 2011-12-18 NOTE — Patient Instructions (Signed)
Push fluids  Albuterol inhaler 2 inhalations every 4-6 hours  Tessalon one to 2 tablets every 8 hours for cough  Azithromycin for infection  Can use OTC cough syrup with DM if something more needed for cough

## 2011-12-18 NOTE — Progress Notes (Signed)
Subjective: Patient has had a persistent problem with a cough the last few days it is bothering her terribly. She had a sore throat and fever earlier your about a week ago, and then this cough came on stronger and she is coughing nonstop. She is unable to sleep at all last night she's been awake for 30 hours.  Objective: TMs normal. Throat clear. Neck supple without nodes. Chest has poor air exchange but no clear wheezing rales or rhonchi. She's given a hand-held nebulizer. After that she moved air better. She did have a tiny wheeze.  Results for orders placed in visit on 12/18/11  POCT CBC      Component Value Range   WBC 6.5  4.6 - 10.2 K/uL   Lymph, poc 2.2  0.6 - 3.4   POC LYMPH PERCENT 33.2  10 - 50 %L   MID (cbc) 0.5  0 - 0.9   POC MID % 7.4  0 - 12 %M   POC Granulocyte 3.9  2 - 6.9   Granulocyte percent 59.4  37 - 80 %G   RBC 5.37  4.04 - 5.48 M/uL   Hemoglobin 14.3  12.2 - 16.2 g/dL   HCT, POC 40.9  81.1 - 47.9 %   MCV 83.1  80 - 97 fL   MCH, POC 26.6 (*) 27 - 31.2 pg   MCHC 32.1  31.8 - 35.4 g/dL   RDW, POC 91.4     Platelet Count, POC 252  142 - 424 K/uL   MPV 9.4  0 - 99.8 fL   [facility] reading (PRIMARY) by  Dr. Alwyn Ren Normal cxr  Assessment: Asthmatic bronchitis  Plan See discharge instructions   .

## 2011-12-22 ENCOUNTER — Ambulatory Visit
Admission: RE | Admit: 2011-12-22 | Discharge: 2011-12-22 | Disposition: A | Discharge status: Home / Self Care | Payer: 59 | Source: Ambulatory Visit | Attending: Family Medicine | Admitting: Family Medicine

## 2011-12-22 DIAGNOSIS — N63 Unspecified lump in unspecified breast: Secondary | ICD-10-CM

## 2011-12-31 ENCOUNTER — Ambulatory Visit (INDEPENDENT_AMBULATORY_CARE_PROVIDER_SITE_OTHER): Payer: Self-pay | Admitting: General Surgery

## 2012-01-14 ENCOUNTER — Ambulatory Visit (INDEPENDENT_AMBULATORY_CARE_PROVIDER_SITE_OTHER): Payer: 59 | Admitting: General Surgery

## 2012-01-14 ENCOUNTER — Encounter (INDEPENDENT_AMBULATORY_CARE_PROVIDER_SITE_OTHER): Payer: Self-pay | Admitting: General Surgery

## 2012-01-14 VITALS — BP 106/68 | HR 72 | Temp 97.2°F | Resp 18 | Ht 67.0 in | Wt 131.0 lb

## 2012-01-14 DIAGNOSIS — D249 Benign neoplasm of unspecified breast: Secondary | ICD-10-CM

## 2012-01-14 NOTE — Progress Notes (Signed)
Subjective:     Patient ID: Cindy Turner, female   DOB: 1985-11-20, 26 y.o.   MRN: [id number]  HPI We're asked to see the patient in consultation by Dr. Micheline Maze to evaluate her for a left nipple discharge. The patient is a 26 year old white female who has had a few episodes of discharge from the left nipple over the last few months. The first occurred about 2 months ago when she developed some redness and swelling laterally on the left breast. She had some cloudy drainage and then the redness resolves. She never took any antibiotics. Since that time she has had a couple episodes where she's noticed a small bit of drainage her bra. She has had attempts at ductograms which have been unsuccessful. She denies any breast pain today. She does have a strong family history of breast cancer with both her mother and grandmother being diagnosed in their 52s.  Review of Systems  Constitutional: Negative.   HENT: Negative.   Eyes: Negative.   Respiratory: Negative.   Cardiovascular: Negative.   Gastrointestinal: Negative.   Genitourinary: Negative.   Musculoskeletal: Negative.   Skin: Negative.   Neurological: Negative.   Hematological: Negative.   Psychiatric/Behavioral: Negative.        Objective:   Physical Exam  Constitutional: She is oriented to person, place, and time. She appears well-developed and well-nourished.  HENT:  Head: Normocephalic and atraumatic.  Eyes: Conjunctivae normal and EOM are normal. Pupils are equal, round, and reactive to light.  Neck: Normal range of motion. Neck supple.  Cardiovascular: Normal rate, regular rhythm and normal heart sounds.   Pulmonary/Chest: Effort normal and breath sounds normal.       She has some symmetric fibrocystic feeling tissue laterally in both breasts. Otherwise there is no palpable mass in either breast. No palpable axillary or supraclavicular cervical lymphadenopathy. I cannot reproduce discharge from the nipple with palpation today   Abdominal: Soft. Bowel sounds are normal. She exhibits no mass. There is no tenderness.  Musculoskeletal: Normal range of motion.  Neurological: She is alert and oriented to person, place, and time.  Skin: Skin is warm and dry.  Psychiatric: She has a normal mood and affect. Her behavior is normal.       Assessment:     The patient has what feels like some fibrocystic tissue laterally in the left breast. Her mammogram and ultrasound did not show any worrisome type masses. Since I cannot reproduce the discharge I think it would be difficult to do an accurate duct excision. I will therefore plan to reevaluate her in about 3 months. Given her family history think her and her mother need to meet with the genetics counselor and he tested. She is opened of this so we will make a referral to our genetics counselor at the cancer center.    Plan:     At this point I will plan to get an MRI to make sure that we are not missing anything given her strong family history. If this is negative we will plan to see her back in about 3 months. We will also plan for a referral to the genetics counselor.

## 2012-01-14 NOTE — Patient Instructions (Signed)
Plan for MRI and genetics counseling

## 2012-01-14 NOTE — Addendum Note (Signed)
Addended byLittie Deeds on: 01/14/2012 03:38 PM   Modules accepted: Orders

## 2012-01-15 ENCOUNTER — Telehealth: Payer: Self-pay | Admitting: Genetic Counselor

## 2012-01-15 NOTE — Telephone Encounter (Signed)
S/W pt in re Genetic Appt 02/13 @ 1 w/[facility].  Welcome packet mailed.

## 2012-02-01 ENCOUNTER — Ambulatory Visit
Admission: RE | Admit: 2012-02-01 | Discharge: 2012-02-01 | Disposition: A | Discharge status: Home / Self Care | Payer: 59 | Source: Ambulatory Visit | Attending: General Surgery | Admitting: General Surgery

## 2012-02-01 DIAGNOSIS — D249 Benign neoplasm of unspecified breast: Secondary | ICD-10-CM

## 2012-02-01 MED ORDER — GADOBENATE DIMEGLUMINE 529 MG/ML IV SOLN
12.0000 mL | Freq: Once | INTRAVENOUS | Status: AC | PRN
  Administered 2012-02-01: 12 mL via INTRAVENOUS

## 2012-02-02 ENCOUNTER — Telehealth (INDEPENDENT_AMBULATORY_CARE_PROVIDER_SITE_OTHER): Payer: Self-pay | Admitting: General Surgery

## 2012-02-02 NOTE — Telephone Encounter (Signed)
LMOM asking pt to return my call. °

## 2012-02-02 NOTE — Telephone Encounter (Signed)
Patient returned my call and I informed her that her MR came back negative.

## 2012-02-02 NOTE — Telephone Encounter (Signed)
Message copied by Littie Deeds on Tue Feb 02, 2012 11:54 AM ------      Message from: Caleen Essex III      Created: Tue Feb 02, 2012 11:24 AM       Looks neg

## 2012-03-08 ENCOUNTER — Ambulatory Visit: Payer: 59

## 2012-03-08 ENCOUNTER — Ambulatory Visit (INDEPENDENT_AMBULATORY_CARE_PROVIDER_SITE_OTHER): Payer: 59 | Admitting: Family Medicine

## 2012-03-08 VITALS — BP 124/72 | HR 64 | Temp 97.7°F | Resp 18 | Ht 67.0 in | Wt 128.0 lb

## 2012-03-08 DIAGNOSIS — IMO0001 Reserved for inherently not codable concepts without codable children: Secondary | ICD-10-CM

## 2012-03-08 DIAGNOSIS — M62838 Other muscle spasm: Secondary | ICD-10-CM

## 2012-03-08 DIAGNOSIS — M549 Dorsalgia, unspecified: Secondary | ICD-10-CM

## 2012-03-08 DIAGNOSIS — M797 Fibromyalgia: Secondary | ICD-10-CM

## 2012-03-08 MED ORDER — CYCLOBENZAPRINE HCL 5 MG PO TABS: ORAL_TABLET | ORAL | Status: DC

## 2012-03-08 NOTE — Patient Instructions (Signed)
Can continue the ibuprofen up to every 8 hours with food.  Flexeril - up to 2 every 8 hours as needed - caution with sedation and this medicine. You can also apply the lidoderm patches as previously prescribed. Recheck if not improving in next 3-4 days.  Return to the clinic or go to the nearest emergency room if any of your symptoms worsen or new symptoms occur.

## 2012-03-08 NOTE — Progress Notes (Signed)
  Subjective:    Patient ID: Cindy Turner, female    DOB: December 05, 1985, 27 y.o.   MRN: [id number]  HPI Cindy Turner is a 27 y.o. female PCP: Dr. Abigail Miyamoto.   3 day hx of back pain upper back initially.  Did have a fall few days prior, but fell onto buttocks, but no initial back pain. Now moving into lower back - feels like sharp pains shooting up L side to neck. Some numb feeling into L  Shoulder area. Hx of similar sx's in past in shoulder blade, and hx of fibromyalgia.  Takes 800mg  ibuprofen in the morning works usually, has taken twice today and not really helping the pain.  No comfortable position. No arm or leg weakness. No new rashes.   Has lidoderm patches at home if needed and has taken lyrica in past for fibromyalgia - but not on this currently.   Did move sofa 1 week prior, but NKI.   PCN shot 2 weeks ago - had pain in that area for 3 days - not sore now.   Nonsmoker.  Not currently working.   Review of Systems  Constitutional: Negative for fever and chills.  Musculoskeletal: Positive for back pain and arthralgias.  Skin: Negative for rash.       Objective:   Physical Exam  Vitals reviewed. Constitutional: She is oriented to person, place, and time. She appears well-developed and well-nourished.  Pulmonary/Chest: Effort normal.  Abdominal: Soft.  Musculoskeletal:       Cervical back: She exhibits decreased range of motion (pain in upper back with neck flexion, but ROM otherwise intact. ).       Thoracic back: She exhibits tenderness, bony tenderness and pain. She exhibits normal range of motion, no swelling, no edema and no deformity.       Back:  Neurological: She is alert and oriented to person, place, and time.  Skin: Skin is warm and dry. No rash noted.  Psychiatric: She has a normal mood and affect. Her behavior is normal.   [facility] reading (PRIMARY) by  Dr. Neva Seat:  C spine - decreased lordosis, ?min spondylosis at C7 T spine: NAD.Marland Kitchen     Assessment  & Plan:  Cindy Turner is a 27 y.o. female 1. Back pain  DG Cervical Spine 2 or 3 views, DG Thoracic Spine 2 View  2. Muscle spasms of neck  cyclobenzaprine (FLEXERIL) 5 MG tablet  3. Fibromyalgia     Possible msk spasm or mid back strain with overuse moving sofa week prior and fall few days prior with underlying fibromyalgia.  Guarded exam with supreficial ttp - neuropathic pain possible.  Spasm in neck likely.  Can use lidoderm patches (has prescribed prior), continue ibuprofen 800mg  as below, and add flexeril (good relief prior per pt). Sed.  rtc precautions if not improving.  Patient Instructions  Can continue the ibuprofen up to every 8 hours with food.  Flexeril - up to 2 every 8 hours as needed - caution with sedation and this medicine. You can also apply the lidoderm patches as previously prescribed. Recheck if not improving in next 3-4 days.  Return to the clinic or go to the nearest emergency room if any of your symptoms worsen or new symptoms occur.

## 2012-03-21 ENCOUNTER — Encounter (INDEPENDENT_AMBULATORY_CARE_PROVIDER_SITE_OTHER): Payer: Self-pay | Admitting: General Surgery

## 2012-03-24 ENCOUNTER — Other Ambulatory Visit: Payer: 59 | Admitting: Lab

## 2012-03-24 ENCOUNTER — Encounter: Payer: 59 | Admitting: Genetic Counselor

## 2012-03-28 ENCOUNTER — Telehealth: Payer: Self-pay | Admitting: *Deleted

## 2012-03-28 NOTE — Telephone Encounter (Signed)
Left message for pt to return our call so we can reschedule her genetic appt.

## 2012-04-06 ENCOUNTER — Telehealth: Payer: Self-pay | Admitting: Genetic Counselor

## 2012-04-06 NOTE — Telephone Encounter (Signed)
Pt called to r/s genetic appt  To 05/01 @ 9 w/[facility] [employer].

## 2012-05-20 ENCOUNTER — Telehealth: Payer: Self-pay

## 2012-05-20 NOTE — Telephone Encounter (Signed)
Pt is wanting to know if she has received a tetanus shot here. If she has she wants some of her records sent somewhere. I told her in that case she would have to come fill out release of information.

## 2012-05-25 NOTE — Telephone Encounter (Signed)
Left message for patient to call back  

## 2012-06-01 NOTE — Telephone Encounter (Signed)
LMOM to call back regarding immunization request.

## 2012-06-09 ENCOUNTER — Ambulatory Visit (HOSPITAL_BASED_OUTPATIENT_CLINIC_OR_DEPARTMENT_OTHER): Payer: 59 | Admitting: Genetic Counselor

## 2012-06-09 ENCOUNTER — Encounter: Payer: Self-pay | Admitting: Genetic Counselor

## 2012-06-09 ENCOUNTER — Other Ambulatory Visit: Payer: 59 | Admitting: Lab

## 2012-06-09 DIAGNOSIS — Z803 Family history of malignant neoplasm of breast: Secondary | ICD-10-CM

## 2012-06-09 NOTE — Progress Notes (Signed)
Dr.  Henrine Screws requested a consultation for genetic counseling and risk assessment for Cindy Turner, a 27 y.o. female, for discussion of her family history of breast and uterine cancer. She presents to clinic today to discuss the possibility of a genetic predisposition to cancer, and to further clarify her risks, as well as her family members' risks for cancer.   HISTORY OF PRESENT ILLNESS: Cindy Turner is a 27 y.o. female with no personal history of cancer.  Last year, Cindy Turner found several lumps in her breast.  She had a mammogram, ultrasound, breast MRI and breast biopsy which was benign.  She has never had a colonoscopy.   Past Medical History  Diagnosis Date  . Kidney stones   . Migraines   . Raynaud disease   . Fibromyalgia   . Kidney stones   . Raynaud's disease   . Kidney stones   . Allergy     Past Surgical History  Procedure Laterality Date  . Tubal ligation    . Laparoscopy    . Tubal ligation    . Kidney stint    . Cholecystectomy    . Breast biopsy    . Tonsillectomy      History  Substance Use Topics  . Smoking status: Never Smoker   . Smokeless tobacco: Not on file  . Alcohol Use: No    REPRODUCTIVE HISTORY AND PERSONAL RISK ASSESSMENT FACTORS: Menarche was at age 44.   Premenopausal Uterus Intact: Yes Ovaries Intact: Yes G3P2A1 , first live birth at age 41  She has not previously undergone treatment for infertility.   OCP use for 1 year.   She has not used HRT in the past.    FAMILY HISTORY:  We obtained a detailed, 4-generation family history.  Significant diagnoses are listed below: Family History  Problem Relation Age of Onset  . Hypertension Mother   . Arthritis Mother   . Breast cancer Mother 73  . Uterine cancer Mother 42  . Breast cancer Maternal Grandmother     dx in her 30s  . Throat cancer Maternal Grandfather     smoker; dx in 89s  . Melanoma Paternal Grandfather   . Melanoma Maternal Aunt     dx in her  53s    Patient's maternal ancestors are of [place], [place] and [place] descent, and paternal ancestors are of [place], [place] and [place] descent. There is no reported Ashkenazi Jewish ancestry. There is no known consanguinity.  GENETIC COUNSELING RISK ASSESSMENT, DISCUSSION, AND SUGGESTED FOLLOW UP: We reviewed the natural history and genetic etiology of sporadic, familial and hereditary cancer syndromes.  About 5-10% of breast cancer is hereditary.  Of this, about 85% is the result of a BRCA1 or BRCA2 mutation.  We reviewed the red flags of hereditary cancer syndromes and the dominant inheritance patterns.  If the BRCA testing is negative, we discussed that we could be testing for the wrong gene.  We discussed gene panels, and that several cancer genes that are associated with different cancers can be tested at the same time. We reviewed that the patient's mother would be the most informative person to test because she has had two cancers.  The patient states that her mother refuses to be tested.  If D[place]ene tests positive for a mutation, we have essentially tested her mother.  Sunnie understands this and wishes to continue with testing. Because of the different types of cancer that are in the patient's family, we will consider one  of the panel tests if she is negative for BRCA mutations.   The patient's family history of breast and uterine is suggestive of the following possible diagnosis: hereditary breast and ovarian cancer syndrome or Cowden syndrome as the result of a PTEN mutation.  We discussed that identification of a hereditary cancer syndrome may help her care providers tailor the patients medical management. If a mutation indicating HBOC or Cowden syndrome is detected in this case, the [employer] recommendations would include increased cancer surveillance and possible prophylactic surgery. If a mutation is detected, the patient will be referred back to the referring  provider and to any additional appropriate care providers to discuss the relevant options.   If a mutation is not found in the patient, cancer surveillance options would be discussed for the patient according to the appropriate standard National Comprehensive Cancer Network and American Cancer Society guidelines, with consideration of their personal and family history risk factors. In this case, the patient will be referred back to their care providers for discussions of management.   In order to estimate her chance of having a BRCA mutation, we used statistical models (Penn II and Tyrer cusik) and laboratory data that take into account her personal medical history, family history and ancestry.  Because each model is different, there can be a lot of variability in the risks they give.  Therefore, these numbers must be considered a rough range and not a precise risk of having a BRCA mutation.  These models estimate that she has approximately a 12-17% chance of having a mutation. Based on this assessment of her family and personal history, genetic testing is recommended.  In order to estimate her mother's chance of having a PTEN mutation, we used a statistical model (PTEN Calculator) and laboratory data that take into account her personal medical history, family history and ancestry.  Because each model is different, there can be a lot of variability in the risks they give.  Therefore, these numbers must be considered a rough range and not a precise risk of having a PTEN mutation mutation.  This model estimates that her mother has approximately a 2.8-7.1% chance of having a mutation. Cindy Turner's risk for a PTEN mutation would be approximately half this.  Based on this assessment of her family and personal history, genetic testing is recommended.  Based on the patient's personal and family history, statistical models (Tyrer Cusik)  and literature data were used to estimate her risk of developing breast cancer.  These estimate her lifetime risk of developing breast cancer to be approximately 55.5%. This estimation does not take into account any genetic testing results.   After considering the risks, benefits, and limitations, the patient provided informed consent for  the following  testing: BRCA1/2, del/dup and reflexing to Comprehensive cancer panel through [facility].   Per the patient's request, we will contact her by telephone to discuss these results. A follow up genetic counseling visit will be scheduled if indicated.  The patient was seen for a total of 60 minutes, greater than 50% of which was spent face-to-face counseling.  This plan is being carried out per Dr. Henrine Screws recommendations.  This note will also be sent to the referring provider via the electronic medical record. The patient will be supplied with a summary of this genetic counseling discussion as well as educational information on the discussed hereditary cancer syndromes following the conclusion of their visit.   Patient was discussed with Dr. Drue Second.  EPIC CC:  Henrine Screws, MD  _______________________________________________________________________ For Office Staff:  Number of people involved in session: 2 Was an Intern/ student involved with case: no }

## 2012-07-06 ENCOUNTER — Telehealth: Payer: Self-pay | Admitting: Genetic Counselor

## 2012-07-06 NOTE — Telephone Encounter (Signed)
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  We discussed that her mother or grandmother should be tested in order to help clarify her risk.  The patient states that she has had discussions with her mother who refuses to be tested.  The patient is interested in what she needs to do in regards to having a TAH/BSO and/or a double mastectomy. According to Romero Liner she has a 55.5% lifetime risk for breast cancer.  Unfortunately, with her mother not wanting to be tested she will need to make decisions about breast cancer screening and/or surgery based on incomplete information.  The patient was advised to talk with her PCP and surgeon again to discuss options.

## 2012-07-06 NOTE — Telephone Encounter (Signed)
Asked that she call back and gave phone number.

## 2012-07-12 ENCOUNTER — Encounter: Payer: Self-pay | Admitting: Genetic Counselor

## 2012-07-12 ENCOUNTER — Telehealth: Payer: Self-pay | Admitting: Genetic Counselor

## 2012-07-12 NOTE — Telephone Encounter (Signed)
Left message for patient.  She had called earlier letting me know that her parents would be willing to be tested for the variant found in the patient.  I left a message that we need to let the lab know that she is interested in participating in a family study and send them a copy of the family history.  They will let us know who in the family they will consider for the family study and we can go from there.  She can call back at [phone].

## 2012-09-08 ENCOUNTER — Other Ambulatory Visit: Payer: Self-pay

## 2012-09-08 ENCOUNTER — Other Ambulatory Visit: Payer: Self-pay | Admitting: *Deleted

## 2012-09-08 ENCOUNTER — Ambulatory Visit
Admission: RE | Admit: 2012-09-08 | Discharge: 2012-09-08 | Disposition: A | Discharge status: Home / Self Care | Payer: 59 | Source: Ambulatory Visit | Attending: *Deleted | Admitting: *Deleted

## 2012-09-08 DIAGNOSIS — R3989 Other symptoms and signs involving the genitourinary system: Secondary | ICD-10-CM

## 2012-09-08 DIAGNOSIS — M549 Dorsalgia, unspecified: Secondary | ICD-10-CM

## 2012-11-17 ENCOUNTER — Other Ambulatory Visit: Payer: Self-pay

## 2012-11-17 DIAGNOSIS — Z1231 Encounter for screening mammogram for malignant neoplasm of breast: Secondary | ICD-10-CM

## 2012-11-29 ENCOUNTER — Emergency Department (HOSPITAL_COMMUNITY)
Admission: EM | Admit: 2012-11-29 | Discharge: 2012-11-30 | Disposition: A | Discharge status: Home / Self Care | Payer: 59 | Attending: Emergency Medicine | Admitting: Emergency Medicine

## 2012-11-29 ENCOUNTER — Encounter (HOSPITAL_COMMUNITY): Payer: Self-pay | Admitting: Emergency Medicine

## 2012-11-29 ENCOUNTER — Emergency Department (HOSPITAL_COMMUNITY)
Admission: EM | Admit: 2012-11-29 | Discharge: 2012-11-29 | Disposition: A | Discharge status: Home / Self Care | Payer: 59 | Source: Home / Self Care | Attending: Family Medicine | Admitting: Family Medicine

## 2012-11-29 DIAGNOSIS — A499 Bacterial infection, unspecified: Secondary | ICD-10-CM | POA: Insufficient documentation

## 2012-11-29 DIAGNOSIS — R1084 Generalized abdominal pain: Secondary | ICD-10-CM | POA: Insufficient documentation

## 2012-11-29 DIAGNOSIS — Z9109 Other allergy status, other than to drugs and biological substances: Secondary | ICD-10-CM | POA: Insufficient documentation

## 2012-11-29 DIAGNOSIS — Z91040 Latex allergy status: Secondary | ICD-10-CM | POA: Insufficient documentation

## 2012-11-29 DIAGNOSIS — I7300 Raynaud's syndrome without gangrene: Secondary | ICD-10-CM | POA: Insufficient documentation

## 2012-11-29 DIAGNOSIS — N760 Acute vaginitis: Secondary | ICD-10-CM | POA: Insufficient documentation

## 2012-11-29 DIAGNOSIS — Z888 Allergy status to other drugs, medicaments and biological substances status: Secondary | ICD-10-CM | POA: Insufficient documentation

## 2012-11-29 DIAGNOSIS — Z87442 Personal history of urinary calculi: Secondary | ICD-10-CM | POA: Insufficient documentation

## 2012-11-29 DIAGNOSIS — IMO0001 Reserved for inherently not codable concepts without codable children: Secondary | ICD-10-CM | POA: Insufficient documentation

## 2012-11-29 DIAGNOSIS — N83209 Unspecified ovarian cyst, unspecified side: Secondary | ICD-10-CM

## 2012-11-29 DIAGNOSIS — N10 Acute tubulo-interstitial nephritis: Secondary | ICD-10-CM

## 2012-11-29 DIAGNOSIS — B9689 Other specified bacterial agents as the cause of diseases classified elsewhere: Secondary | ICD-10-CM | POA: Insufficient documentation

## 2012-11-29 DIAGNOSIS — M549 Dorsalgia, unspecified: Secondary | ICD-10-CM | POA: Insufficient documentation

## 2012-11-29 DIAGNOSIS — Z8669 Personal history of other diseases of the nervous system and sense organs: Secondary | ICD-10-CM | POA: Insufficient documentation

## 2012-11-29 DIAGNOSIS — R109 Unspecified abdominal pain: Secondary | ICD-10-CM

## 2012-11-29 DIAGNOSIS — N390 Urinary tract infection, site not specified: Secondary | ICD-10-CM | POA: Insufficient documentation

## 2012-11-29 DIAGNOSIS — R6883 Chills (without fever): Secondary | ICD-10-CM | POA: Insufficient documentation

## 2012-11-29 DIAGNOSIS — R110 Nausea: Secondary | ICD-10-CM | POA: Insufficient documentation

## 2012-11-29 LAB — BASIC METABOLIC PANEL
BUN: 9 mg/dL (ref 6–23)
Calcium: 9.3 mg/dL (ref 8.4–10.5)
Creatinine, Ser: 0.73 mg/dL (ref 0.50–1.10)
GFR calc Af Amer: >90 mL/min (ref >90)
GFR calc non Af Amer: >90 mL/min (ref >90)
Glucose, Bld: 90 mg/dL (ref 70–99)

## 2012-11-29 LAB — CBC WITH DIFFERENTIAL/PLATELET
Basophils Relative: 1 % (ref 0–1)
Eosinophils Absolute: 0 10*3/uL (ref 0.0–0.7)
Eosinophils Relative: 1 % (ref 0–5)
MCH: 26.9 pg (ref 26.0–34.0)
MCHC: 34.4 g/dL (ref 30.0–36.0)
MCV: 78.2 fL (ref 78.0–100.0)
Neutrophils Relative %: 74 % (ref 43–77)
Platelets: 150 10*3/uL (ref 150–400)
RDW: 13.4 % (ref 11.5–15.5)

## 2012-11-29 LAB — POCT URINALYSIS DIP (DEVICE)
Bilirubin Urine: NEGATIVE
Nitrite: NEGATIVE
Protein, ur: 30 mg/dL — AB
Urobilinogen, UA: 0.2 mg/dL (ref 0.0–1.0)
pH: 6 (ref 5.0–8.0)

## 2012-11-29 MED ORDER — ONDANSETRON 4 MG PO TBDP
8.0000 mg | ORAL_TABLET | Freq: Once | ORAL | Status: AC
  Administered 2012-11-29: 8 mg via ORAL
  Filled 2012-11-29: qty 2

## 2012-11-29 MED ORDER — OXYCODONE-ACETAMINOPHEN 5-325 MG PO TABS
1.0000 | ORAL_TABLET | Freq: Once | ORAL | Status: AC
Start: 2012-11-29 — End: 2012-11-29
  Administered 2012-11-29: 1 via ORAL
  Filled 2012-11-29: qty 1

## 2012-11-29 NOTE — ED Notes (Signed)
C/o right abd pain.  Patient thinks she is trying to past a kidney stone but not for sure.   States urine does have odor and contained blood in it on Friday.

## 2012-11-29 NOTE — ED Provider Notes (Signed)
CSN: [id number]     Arrival date & time 11/29/12  1636 History   First MD Initiated Contact with Patient 11/29/12 1748     Chief Complaint  Patient presents with  . Nephrolithiasis   (Consider location/radiation/quality/duration/timing/severity/associated sxs/prior Treatment) Patient is a 27 y.o. female presenting with flank pain. The history is provided by the patient and the spouse.  Flank Pain This is a new problem. The current episode started more than 1 week ago. The problem has been gradually worsening (uti sx initially which resolved then relapsed on fri with hematuria and right flank pain, chills and nausea.). Associated symptoms include abdominal pain. Pertinent negatives include no chest pain.    Past Medical History  Diagnosis Date  . Kidney stones   . Migraines   . Raynaud disease   . Fibromyalgia   . Kidney stones   . Raynaud's disease   . Kidney stones   . Allergy    Past Surgical History  Procedure Laterality Date  . Tubal ligation    . Laparoscopy    . Tubal ligation    . Kidney stint    . Cholecystectomy    . Breast biopsy    . Tonsillectomy     Family History  Problem Relation Age of Onset  . Hypertension Mother   . Arthritis Mother   . Breast cancer Mother 29  . Uterine cancer Mother 73  . Breast cancer Maternal Grandmother     dx in her 30s  . Throat cancer Maternal Grandfather     smoker; dx in 45s  . Melanoma Paternal Grandfather   . Melanoma Maternal Aunt     dx in her 30s   History  Substance Use Topics  . Smoking status: Never Smoker   . Smokeless tobacco: Not on file  . Alcohol Use: No   OB History   Grav Para Term Preterm Abortions TAB SAB Ect Mult Living   3 2 2  1  1   3      Review of Systems  Constitutional: Positive for chills and appetite change. Negative for fever.  Cardiovascular: Negative for chest pain.  Gastrointestinal: Positive for nausea and abdominal pain. Negative for diarrhea and constipation.  Genitourinary:  Positive for hematuria and flank pain. Negative for vaginal bleeding, vaginal discharge, menstrual problem and pelvic pain.    Allergies  Latex; Morphine and related; Terbutaline sulfate; and Vitamin a  Home Medications   Current Outpatient Rx  Name  Route  Sig  Dispense  Refill  . albuterol (PROVENTIL HFA;VENTOLIN HFA) 108 (90 BASE) MCG/ACT inhaler   Inhalation   Inhale 2 puffs into the lungs every 6 (six) hours as needed for wheezing.   1 Inhaler   0   . Ascorbic Acid (VITAMIN C) 100 MG tablet   Oral   Take 100 mg by mouth daily.         Marland Kitchen azithromycin (ZITHROMAX) 250 MG tablet      Take 2 initiallly, then one daily for 4 days   6 tablet   0   . benzonatate (TESSALON) 100 MG capsule      Use 1-2 tablets 3 times daily as necessary for cough. May be used with other cough medicines if needed.   30 capsule   0   . BIOTIN 5000 PO   Oral   Take 1 tablet by mouth daily.         . Calcium Carbonate-Vitamin D (CALCIUM 600+D PO)   Oral  Take 1 tablet by mouth daily.         . cholecalciferol (VITAMIN D) 1000 UNITS tablet   Oral   Take 2,000 Units by mouth daily.         . ciprofloxacin (CIPRO) 250 MG tablet   Oral   Take 250 mg by mouth 2 (two) times daily. Started on 09-09-11 patient is to take for 1 week.         . co-enzyme Q-10 30 MG capsule   Oral   Take 30 mg by mouth daily.         . cyclobenzaprine (FLEXERIL) 5 MG tablet      1 pill by mouth up to every 8 hours as needed. Start with one pill by mouth each bedtime as needed due to sedation   20 tablet   0   . ferrous sulfate 325 (65 FE) MG tablet   Oral   Take 325 mg by mouth daily with breakfast.         . ibuprofen (ADVIL,MOTRIN) 800 MG tablet   Oral   Take 800 mg by mouth every morning.         . predniSONE (DELTASONE) 20 MG tablet      Take 3 pills daily for 2 days, then daily for 2 days, then one daily for 2 days   12 tablet   0   . pseudoephedrine (SUDAFED) 30 MG tablet    Oral   Take 30 mg by mouth every 4 (four) hours as needed.         . vitamin B-12 (CYANOCOBALAMIN) 100 MCG tablet   Oral   Take 50 mcg by mouth daily.         Marland Kitchen zinc gluconate 50 MG tablet   Oral   Take 50 mg by mouth daily.          LMP 11/21/2012 Physical Exam  Nursing note and vitals reviewed. Constitutional: She is oriented to person, place, and time. She appears well-developed and well-nourished. She appears distressed.  Abdominal: Soft. Bowel sounds are normal. She exhibits no distension and no mass. There is no hepatosplenomegaly. There is tenderness in the suprapubic area. There is CVA tenderness. There is no rebound and no guarding.  Neurological: She is alert and oriented to person, place, and time.  Skin: Skin is warm and dry.    ED Course  Procedures (including critical care time) Labs Review Labs Reviewed  POCT URINALYSIS DIP (DEVICE) - Abnormal; Notable for the following:    Hgb urine dipstick TRACE (*)    Protein, ur 30 (*)    Leukocytes, UA SMALL (*)    All other components within normal limits  POCT PREGNANCY, URINE   Imaging Review No results found.  .  MDM      Linna Hoff, MD 11/29/12 [phone]

## 2012-11-29 NOTE — ED Notes (Signed)
Presents with right flank pain, hematuria, chills, and nausea which began one week ago. Sent over from [facility] today with a DX of acute Pyelonephritis. Pt reports headache, urinary frequency, dysuria, right flank pain. UA completed at [facility].

## 2012-11-30 ENCOUNTER — Encounter (HOSPITAL_COMMUNITY): Payer: Self-pay | Admitting: Radiology

## 2012-11-30 ENCOUNTER — Emergency Department (HOSPITAL_COMMUNITY): Payer: 59

## 2012-11-30 LAB — HEPATIC FUNCTION PANEL
ALT: 10 U/L (ref 0–35)
AST: 14 U/L (ref 0–37)
Albumin: 4.3 g/dL (ref 3.5–5.2)
Alkaline Phosphatase: 58 U/L (ref 39–117)
Bilirubin, Direct: 0.2 mg/dL (ref 0.0–0.3)
Total Bilirubin: 1 mg/dL (ref 0.3–1.2)

## 2012-11-30 LAB — URINALYSIS, ROUTINE W REFLEX MICROSCOPIC
Bilirubin Urine: NEGATIVE
Ketones, ur: 40 mg/dL — AB
Nitrite: NEGATIVE
Urobilinogen, UA: 0.2 mg/dL (ref 0.0–1.0)
pH: 6 (ref 5.0–8.0)

## 2012-11-30 LAB — URINE MICROSCOPIC-ADD ON

## 2012-11-30 LAB — GC/CHLAMYDIA PROBE AMP
CT Probe RNA: NEGATIVE
GC Probe RNA: NEGATIVE

## 2012-11-30 LAB — WET PREP, GENITAL: Trich, Wet Prep: NONE SEEN

## 2012-11-30 MED ORDER — DEXTROSE 5 % IV SOLN
1.0000 g | Freq: Once | INTRAVENOUS | Status: AC
  Administered 2012-11-30: 1 g via INTRAVENOUS
  Filled 2012-11-30: qty 10

## 2012-11-30 MED ORDER — ONDANSETRON HCL 4 MG/2ML IJ SOLN
4.0000 mg | Freq: Once | INTRAMUSCULAR | Status: AC
  Administered 2012-11-30: 4 mg via INTRAVENOUS
  Filled 2012-11-30: qty 2

## 2012-11-30 MED ORDER — OXYCODONE-ACETAMINOPHEN 5-325 MG PO TABS: 1.0000 | ORAL_TABLET | ORAL | Status: DC | PRN

## 2012-11-30 MED ORDER — CIPROFLOXACIN HCL 500 MG PO TABS: 500.0000 mg | ORAL_TABLET | Freq: Two times a day (BID) | ORAL | Status: DC

## 2012-11-30 MED ORDER — KETOROLAC TROMETHAMINE 30 MG/ML IJ SOLN
30.0000 mg | Freq: Once | INTRAMUSCULAR | Status: AC
  Administered 2012-11-30: 30 mg via INTRAVENOUS
  Filled 2012-11-30: qty 1

## 2012-11-30 MED ORDER — IOHEXOL 300 MG/ML  SOLN
100.0000 mL | Freq: Once | INTRAMUSCULAR | Status: AC | PRN
  Administered 2012-11-30: 100 mL via INTRAVENOUS

## 2012-11-30 MED ORDER — METRONIDAZOLE 500 MG PO TABS: 500.0000 mg | ORAL_TABLET | Freq: Two times a day (BID) | ORAL | Status: DC

## 2012-11-30 MED ORDER — IBUPROFEN 600 MG PO TABS: 600.0000 mg | ORAL_TABLET | Freq: Four times a day (QID) | ORAL | Status: DC | PRN

## 2012-11-30 MED ORDER — ONDANSETRON 4 MG PO TBDP: ORAL_TABLET | ORAL | Status: DC

## 2012-11-30 MED ORDER — MORPHINE SULFATE 4 MG/ML IJ SOLN
4.0000 mg | Freq: Once | INTRAMUSCULAR | Status: AC
  Administered 2012-11-30: 4 mg via INTRAVENOUS
  Filled 2012-11-30: qty 1

## 2012-11-30 MED ORDER — SODIUM CHLORIDE 0.9 % IV BOLUS (SEPSIS)
1000.0000 mL | Freq: Once | INTRAVENOUS | Status: AC
  Administered 2012-11-30: 1000 mL via INTRAVENOUS

## 2012-11-30 NOTE — ED Provider Notes (Signed)
CSN: [id number]     Arrival date & time 11/29/12  1858 History   First MD Initiated Contact with Patient 11/29/12 2348     Chief Complaint  Patient presents with  . Urinary Tract Infection   (Consider location/radiation/quality/duration/timing/severity/associated sxs/prior Treatment) HPI Presents with one week of right flank pain that radiates to her right lower quadrant and suprapubic region. Patient states the pain is "shooting". Is associated with hesitancy, hematuria, nausea and chills. Patient has a history of renal stones but is unsure whether the pain she is having is similar. Denies any fever or vomiting. She has no vaginal bleeding or discharge. Patient was seen in the urgent care clinic and had normal laboratory tests with small amount of leukocytes in her urine. Was sent to the emergency department rule out pyelonephritis. Patient also admits to recently moving and lifting a lot of boxes. Past Medical History  Diagnosis Date  . Kidney stones   . Migraines   . Raynaud disease   . Fibromyalgia   . Kidney stones   . Raynaud's disease   . Kidney stones   . Allergy    Past Surgical History  Procedure Laterality Date  . Tubal ligation    . Laparoscopy    . Tubal ligation    . Kidney stint    . Cholecystectomy    . Breast biopsy    . Tonsillectomy     Family History  Problem Relation Age of Onset  . Hypertension Mother   . Arthritis Mother   . Breast cancer Mother 82  . Uterine cancer Mother 46  . Breast cancer Maternal Grandmother     dx in her 30s  . Throat cancer Maternal Grandfather     smoker; dx in 53s  . Melanoma Paternal Grandfather   . Melanoma Maternal Aunt     dx in her 30s   History  Substance Use Topics  . Smoking status: Never Smoker   . Smokeless tobacco: Not on file  . Alcohol Use: No   OB History   Grav Para Term Preterm Abortions TAB SAB Ect Mult Living   3 2 2  1  1   3      Review of Systems  Constitutional: Positive for chills.  Negative for fever.  Respiratory: Negative for cough and shortness of breath.   Cardiovascular: Negative for chest pain.  Gastrointestinal: Positive for nausea and abdominal pain. Negative for vomiting, diarrhea, constipation and blood in stool.  Genitourinary: Positive for frequency, hematuria, flank pain and difficulty urinating. Negative for vaginal bleeding, vaginal discharge and pelvic pain.  Musculoskeletal: Positive for back pain and myalgias. Negative for neck pain and neck stiffness.  Skin: Negative for rash and wound.  Neurological: Negative for dizziness, syncope, weakness, light-headedness and numbness.  All other systems reviewed and are negative.    Allergies  Terbutaline sulfate; Vitamin a; and Latex  Home Medications  No current outpatient prescriptions on file. BP 112/52  Pulse 94  Temp(Src) 98.3 F (36.8 C) (Oral)  Resp 18  SpO2 98%  LMP 11/21/2012 Physical Exam  Nursing note and vitals reviewed. Constitutional: She is oriented to person, place, and time. She appears well-developed and well-nourished. No distress.  HENT:  Head: Normocephalic and atraumatic.  Mouth/Throat: Oropharynx is clear and moist.  Eyes: EOM are normal. Pupils are equal, round, and reactive to light.  Neck: Normal range of motion. Neck supple.  Cardiovascular: Normal rate and regular rhythm.   Pulmonary/Chest: Effort normal and breath sounds normal.  No respiratory distress. She has no wheezes. She has no rales. She exhibits no tenderness.  Abdominal: Soft. Bowel sounds are normal. She exhibits no distension and no mass. There is tenderness (patient with diffuse tenderness to palpation but worse in the right upper quadrant and right lower quadrant. No rebound or guarding.). There is no rebound and no guarding.  Genitourinary: Vaginal discharge (yellow vaginal discharge.) found.  Patient with no cervical motion tenderness. She does have right adnexal tenderness consistent with right ovarian  cyst.  Musculoskeletal: Normal range of motion. She exhibits tenderness (patient has tenderness to very light palpation over her right CVA and right lumbar paraspinal muscles. She has no midline tenderness.). She exhibits no edema.  Neurological: She is alert and oriented to person, place, and time.  Patient is alert and oriented x3 with clear, goal oriented speech. Patient has 5/5 motor in all extremities. Sensation is intact to light touch.    Skin: Skin is warm and dry. No rash noted. No erythema.  Psychiatric:  Appears anxious    ED Course  Procedures (including critical care time) Labs Review Labs Reviewed  CBC WITH DIFFERENTIAL  BASIC METABOLIC PANEL  URINALYSIS, ROUTINE W REFLEX MICROSCOPIC  HEPATIC FUNCTION PANEL   Imaging Review No results found.  EKG Interpretation   None       MDM  Patient is feeling much more comfortable after the IV fluids and medication. I explained results of her testing. Though I think unlikely she has pyelonephritis given her normal white blood count and no findings on CT I will treat her as such. She's been advised of the need to followup with the her OB/GYN for recurrent ovarian cyst rupture. I have advised the patient to return immediately to the emergency department for worsening pain, fever, vomiting or any concerns. Patient's voiced understanding.  Loren Racer, MD 11/30/12 [phone]

## 2012-12-02 LAB — URINE CULTURE: Colony Count: >=100000

## 2012-12-03 ENCOUNTER — Telehealth (HOSPITAL_COMMUNITY): Payer: Self-pay | Admitting: Emergency Medicine

## 2012-12-03 NOTE — ED Notes (Signed)
Post ED Visit - Positive Culture Follow-up  Culture report reviewed by antimicrobial stewardship pharmacist: []  Wes Dulaney, Pharm.D., BCPS []  Celedonio Miyamoto, Pharm.D., BCPS []  Georgina Pillion, Pharm.D., BCPS [x]  [place], [address].D., BCPS, AAHIVP []  Estella Husk, Pharm.D., BCPS, AAHIVP  Positive urine culture Treated with Cipro, organism sensitive to the same and no further patient follow-up is required at this time.  Kylie A Holland 12/03/2012, 9:23 AM

## 2012-12-03 NOTE — ED Notes (Signed)
Post ED Visit - Positive Culture Follow-up  Culture report reviewed by antimicrobial stewardship pharmacist: []  Wes Dulaney, Pharm.D., BCPS []  Celedonio Miyamoto, Pharm.D., BCPS []  Georgina Pillion, [address].D., BCPS []  [place], [place].D., BCPS, AAHIVP [x]  Estella Husk, Pharm.D., BCPS, AAHIVP  Positive urine culture Treated with Cipro, organism sensitive to the same and no further patient follow-up is required at this time.  Kylie A Holland 12/03/2012, 10:07 AM

## 2012-12-08 ENCOUNTER — Ambulatory Visit: Payer: 59

## 2012-12-10 ENCOUNTER — Emergency Department (HOSPITAL_BASED_OUTPATIENT_CLINIC_OR_DEPARTMENT_OTHER)
Admission: EM | Admit: 2012-12-10 | Discharge: 2012-12-10 | Disposition: A | Discharge status: Home / Self Care | Payer: 59 | Attending: Emergency Medicine | Admitting: Emergency Medicine

## 2012-12-10 ENCOUNTER — Emergency Department (HOSPITAL_BASED_OUTPATIENT_CLINIC_OR_DEPARTMENT_OTHER): Payer: 59

## 2012-12-10 DIAGNOSIS — J029 Acute pharyngitis, unspecified: Secondary | ICD-10-CM

## 2012-12-10 DIAGNOSIS — Z87442 Personal history of urinary calculi: Secondary | ICD-10-CM | POA: Insufficient documentation

## 2012-12-10 DIAGNOSIS — Z91040 Latex allergy status: Secondary | ICD-10-CM | POA: Insufficient documentation

## 2012-12-10 DIAGNOSIS — R197 Diarrhea, unspecified: Secondary | ICD-10-CM | POA: Insufficient documentation

## 2012-12-10 DIAGNOSIS — Z8742 Personal history of other diseases of the female genital tract: Secondary | ICD-10-CM | POA: Insufficient documentation

## 2012-12-10 DIAGNOSIS — J9801 Acute bronchospasm: Secondary | ICD-10-CM

## 2012-12-10 DIAGNOSIS — Z8739 Personal history of other diseases of the musculoskeletal system and connective tissue: Secondary | ICD-10-CM | POA: Insufficient documentation

## 2012-12-10 DIAGNOSIS — Z87440 Personal history of urinary (tract) infections: Secondary | ICD-10-CM | POA: Insufficient documentation

## 2012-12-10 DIAGNOSIS — R112 Nausea with vomiting, unspecified: Secondary | ICD-10-CM | POA: Insufficient documentation

## 2012-12-10 DIAGNOSIS — Z8679 Personal history of other diseases of the circulatory system: Secondary | ICD-10-CM | POA: Insufficient documentation

## 2012-12-10 LAB — RAPID STREP SCREEN (MED CTR MEBANE ONLY): Streptococcus, Group A Screen (Direct): NEGATIVE

## 2012-12-10 LAB — COMPREHENSIVE METABOLIC PANEL
ALT: 26 U/L (ref 0–35)
AST: 25 U/L (ref 0–37)
Albumin: 4 g/dL (ref 3.5–5.2)
Alkaline Phosphatase: 56 U/L (ref 39–117)
BUN: 10 mg/dL (ref 6–23)
CO2: 24 mEq/L (ref 19–32)
Calcium: 9.7 mg/dL (ref 8.4–10.5)
GFR calc Af Amer: >90 mL/min (ref >90)
GFR calc non Af Amer: 86 mL/min — ABNORMAL LOW (ref >90)
Glucose, Bld: 94 mg/dL (ref 70–99)
Potassium: 3.9 mEq/L (ref 3.5–5.1)
Sodium: 135 mEq/L (ref 135–145)

## 2012-12-10 LAB — CBC WITH DIFFERENTIAL/PLATELET
Basophils Relative: 0 % (ref 0–1)
Eosinophils Absolute: 0.1 10*3/uL (ref 0.0–0.7)
Eosinophils Relative: 2 % (ref 0–5)
Hemoglobin: 12.3 g/dL (ref 12.0–15.0)
Lymphocytes Relative: 27 % (ref 12–46)
Lymphs Abs: 1 10*3/uL (ref 0.7–4.0)
MCH: 26.2 pg (ref 26.0–34.0)
MCV: 77.4 fL — ABNORMAL LOW (ref 78.0–100.0)
Monocytes Absolute: 0.9 10*3/uL (ref 0.1–1.0)
Monocytes Relative: 25 % — ABNORMAL HIGH (ref 3–12)
Neutro Abs: 1.7 10*3/uL (ref 1.7–7.7)
Neutrophils Relative %: 47 % (ref 43–77)
Platelets: 200 10*3/uL (ref 150–400)
RBC: 4.69 MIL/uL (ref 3.87–5.11)
WBC: 3.5 10*3/uL — ABNORMAL LOW (ref 4.0–10.5)

## 2012-12-10 LAB — MONONUCLEOSIS SCREEN: Mono Screen: NEGATIVE

## 2012-12-10 MED ORDER — HYDROCODONE-ACETAMINOPHEN 7.5-325 MG/15ML PO SOLN: 15.0000 mL | Freq: Four times a day (QID) | ORAL | Status: DC | PRN

## 2012-12-10 MED ORDER — LEVALBUTEROL HCL 0.63 MG/3ML IN NEBU
0.6300 mg | INHALATION_SOLUTION | Freq: Once | RESPIRATORY_TRACT | Status: AC
  Administered 2012-12-10: 0.63 mg via RESPIRATORY_TRACT
  Filled 2012-12-10: qty 3

## 2012-12-10 MED ORDER — LEVALBUTEROL TARTRATE 45 MCG/ACT IN AERO: 1.0000 | INHALATION_SPRAY | RESPIRATORY_TRACT | Status: DC | PRN

## 2012-12-10 MED ORDER — SODIUM CHLORIDE 0.9 % IV BOLUS (SEPSIS)
1000.0000 mL | Freq: Once | INTRAVENOUS | Status: AC
  Administered 2012-12-10: 1000 mL via INTRAVENOUS

## 2012-12-10 MED ORDER — ONDANSETRON HCL 4 MG/2ML IJ SOLN
4.0000 mg | Freq: Once | INTRAMUSCULAR | Status: AC
  Administered 2012-12-10: 4 mg via INTRAVENOUS
  Filled 2012-12-10: qty 2

## 2012-12-10 NOTE — ED Notes (Signed)
C/o sore throat x 1 week progressively getting worst. difficulty swallowing, white patches over tonsils, tonsils 2+. Heavy chest pressure onset last night with SOB, nausea, vomiting, and feels constant. Currently on antibiotic (cirpo, flagyl) for UTI, and pyelonephritis, and bacterial vaginosis.

## 2012-12-10 NOTE — ED Provider Notes (Signed)
CSN: [id number]     Arrival date & time 12/10/12  0607 History   First MD Initiated Contact with Patient 12/10/12 [phone]     Chief Complaint  Patient presents with  . Sore Throat   (Consider location/radiation/quality/duration/timing/severity/associated sxs/prior Treatment) HPI This is a 27 year old female who is head of sore throat for over a week. The pain is moderate to severe and worse with swallowing. It is severe enough she cannot swallow tablets. She is supposed to be taking ciprofloxacin and Flagyl for UTI and bacterial vaginosis; urine culture grew out Escherichia coli sensitive to Cipro. The sore throat has been accompanied by vomiting and diarrhea and she is nauseated now. Since yesterday she has felt short of breath with chest discomfort, worse when taking a deep breath; the discomfort is located primarily in the lower substernal area and left lower chest.  Past Medical History  Diagnosis Date  . Kidney stones   . Migraines   . Raynaud disease   . Fibromyalgia   . Kidney stones   . Raynaud's disease   . Kidney stones   . Allergy    Past Surgical History  Procedure Laterality Date  . Tubal ligation    . Laparoscopy    . Tubal ligation    . Kidney stint    . Cholecystectomy    . Breast biopsy    . Tonsillectomy     Family History  Problem Relation Age of Onset  . Hypertension Mother   . Arthritis Mother   . Breast cancer Mother 36  . Uterine cancer Mother 64  . Breast cancer Maternal Grandmother     dx in her 30s  . Throat cancer Maternal Grandfather     smoker; dx in 61s  . Melanoma Paternal Grandfather   . Melanoma Maternal Aunt     dx in her 30s   History  Substance Use Topics  . Smoking status: Never Smoker   . Smokeless tobacco: Not on file  . Alcohol Use: No   OB History   Grav Para Term Preterm Abortions TAB SAB Ect Mult Living   3 2 2  1  1   3      Review of Systems  All other systems reviewed and are negative.    Allergies  Terbutaline  sulfate; Vitamin a; and Latex  Home Medications   Current Outpatient Rx  Name  Route  Sig  Dispense  Refill  . ciprofloxacin (CIPRO) 500 MG tablet   Oral   Take 1 tablet (500 mg total) by mouth 2 (two) times daily. One po bid x 7 days   14 tablet   0   . ibuprofen (ADVIL,MOTRIN) 600 MG tablet   Oral   Take 1 tablet (600 mg total) by mouth every 6 (six) hours as needed for pain.   30 tablet   0   . metroNIDAZOLE (FLAGYL) 500 MG tablet   Oral   Take 1 tablet (500 mg total) by mouth 2 (two) times daily. One po bid x 7 days   14 tablet   0   . ondansetron (ZOFRAN ODT) 4 MG disintegrating tablet      4mg  ODT q4 hours prn nausea/vomit   8 tablet   0   . oxyCODONE-acetaminophen (PERCOCET) 5-325 MG per tablet   Oral   Take 1 tablet by mouth every 4 (four) hours as needed for pain.   10 tablet   0    BP 117/67  Pulse 72  Temp(Src)  98.2 F (36.8 C) (Oral)  Resp 18  SpO2 100%  LMP 11/21/2012  Physical Exam General: Well-developed, well-nourished female in no acute distress; appearance consistent with age of record HENT: normocephalic; atraumatic; pharyngeal erythema Eyes: pupils equal, round and reactive to light; extraocular muscles intact Neck: supple; tender lymphadenopathy Heart: regular rate and rhythm Lungs: clear to auscultation bilaterally; deep breathing triggers cough Abdomen: soft; nondistended; significant left upper quadrant tenderness, mild right upper quadrant and suprapubic tenderness; no masses or hepatosplenomegaly; bowel sounds present Extremities: No deformity; full range of motion; pulses normal Neurologic: Awake, alert and oriented; motor function intact in all extremities and symmetric; no facial droop Skin: Warm and dry Psychiatric: Normal mood and affect    ED Course  Procedures (including critical care time)   MDM   Nursing notes and vitals signs, including pulse oximetry, reviewed.  Summary of this visit's results, reviewed by  myself:  Labs:  Results for orders placed during the hospital encounter of 12/10/12 (from the past 24 hour(s))  RAPID STREP SCREEN     Status: None   Collection Time    12/10/12  6:25 AM      Result Value Range   Streptococcus, Group A Screen (Direct) NEGATIVE  NEGATIVE  CBC WITH DIFFERENTIAL     Status: Abnormal   Collection Time    12/10/12  6:35 AM      Result Value Range   WBC 3.5 (*) 4.0 - 10.5 K/uL   RBC 4.69  3.87 - 5.11 MIL/uL   Hemoglobin 12.3  12.0 - 15.0 g/dL   HCT 04.5  40.9 - 81.1 %   MCV 77.4 (*) 78.0 - 100.0 fL   MCH 26.2  26.0 - 34.0 pg   MCHC 33.9  30.0 - 36.0 g/dL   RDW 91.4  78.2 - 95.6 %   Platelets 200  150 - 400 K/uL   Neutrophils Relative % 47  43 - 77 %   Neutro Abs 1.7  1.7 - 7.7 K/uL   Lymphocytes Relative 27  12 - 46 %   Lymphs Abs 1.0  0.7 - 4.0 K/uL   Monocytes Relative 25 (*) 3 - 12 %   Monocytes Absolute 0.9  0.1 - 1.0 K/uL   Eosinophils Relative 2  0 - 5 %   Eosinophils Absolute 0.1  0.0 - 0.7 K/uL   Basophils Relative 0  0 - 1 %   Basophils Absolute 0.0  0.0 - 0.1 K/uL  MONONUCLEOSIS SCREEN     Status: None   Collection Time    12/10/12  6:35 AM      Result Value Range   Mono Screen NEGATIVE  NEGATIVE  COMPREHENSIVE METABOLIC PANEL     Status: Abnormal   Collection Time    12/10/12  6:35 AM      Result Value Range   Sodium 135  135 - 145 mEq/L   Potassium 3.9  3.5 - 5.1 mEq/L   Chloride 101  96 - 112 mEq/L   CO2 24  19 - 32 mEq/L   Glucose, Bld 94  70 - 99 mg/dL   BUN 10  6 - 23 mg/dL   Creatinine, Ser 2.13  0.50 - 1.10 mg/dL   Calcium 9.7  8.4 - 08.6 mg/dL   Total Protein 7.3  6.0 - 8.3 g/dL   Albumin 4.0  3.5 - 5.2 g/dL   AST 25  0 - 37 U/L   ALT 26  0 - 35 U/L  Alkaline Phosphatase 56  39 - 117 U/L   Total Bilirubin 0.3  0.3 - 1.2 mg/dL   GFR calc non Af Amer 86 (*) >90 mL/min   GFR calc Af Amer >90  >90 mL/min    Imaging Studies: Dg Chest 2 View  12/10/2012   CLINICAL DATA:  Sore throat  EXAM: CHEST  2 VIEW   COMPARISON:  12/18/2011  FINDINGS: The heart size and mediastinal contours are within normal limits. Both lungs are clear of edema or consolidation. No effusion or pneumothorax. The visualized skeletal structures are unremarkable.  IMPRESSION: No evidence of active cardiopulmonary disease.   Electronically Signed   By: Tiburcio Pea M.D.   On: 12/10/2012 06:50   7:10 AM Air movement improved after Xopenex neb treatment. Patient likely has a lingering viral syndrome. She is negative for strep, negative for mononucleosis and her labs are within normal limits with exception of a mild leukopenia. She has ondansetron at home for nausea. We will treat with a liquid hydrocodone preparation that she has not been able to swallow pills.     Hanley Seamen, MD 12/10/12 [phone]

## 2012-12-11 ENCOUNTER — Emergency Department (HOSPITAL_BASED_OUTPATIENT_CLINIC_OR_DEPARTMENT_OTHER)
Admission: EM | Admit: 2012-12-11 | Discharge: 2012-12-11 | Disposition: A | Discharge status: Home / Self Care | Payer: 59 | Attending: Emergency Medicine | Admitting: Emergency Medicine

## 2012-12-11 ENCOUNTER — Encounter (HOSPITAL_BASED_OUTPATIENT_CLINIC_OR_DEPARTMENT_OTHER): Payer: Self-pay | Admitting: Emergency Medicine

## 2012-12-11 DIAGNOSIS — Z8679 Personal history of other diseases of the circulatory system: Secondary | ICD-10-CM | POA: Insufficient documentation

## 2012-12-11 DIAGNOSIS — R509 Fever, unspecified: Secondary | ICD-10-CM | POA: Insufficient documentation

## 2012-12-11 DIAGNOSIS — Z792 Long term (current) use of antibiotics: Secondary | ICD-10-CM | POA: Insufficient documentation

## 2012-12-11 DIAGNOSIS — Z91040 Latex allergy status: Secondary | ICD-10-CM | POA: Insufficient documentation

## 2012-12-11 DIAGNOSIS — B349 Viral infection, unspecified: Secondary | ICD-10-CM

## 2012-12-11 DIAGNOSIS — Z79899 Other long term (current) drug therapy: Secondary | ICD-10-CM | POA: Insufficient documentation

## 2012-12-11 DIAGNOSIS — B9789 Other viral agents as the cause of diseases classified elsewhere: Secondary | ICD-10-CM | POA: Insufficient documentation

## 2012-12-11 DIAGNOSIS — IMO0001 Reserved for inherently not codable concepts without codable children: Secondary | ICD-10-CM | POA: Insufficient documentation

## 2012-12-11 DIAGNOSIS — R1110 Vomiting, unspecified: Secondary | ICD-10-CM | POA: Insufficient documentation

## 2012-12-11 DIAGNOSIS — Z87442 Personal history of urinary calculi: Secondary | ICD-10-CM | POA: Insufficient documentation

## 2012-12-11 DIAGNOSIS — R1033 Periumbilical pain: Secondary | ICD-10-CM | POA: Insufficient documentation

## 2012-12-11 DIAGNOSIS — R42 Dizziness and giddiness: Secondary | ICD-10-CM | POA: Insufficient documentation

## 2012-12-11 LAB — CBC WITH DIFFERENTIAL/PLATELET
HCT: 35.2 % — ABNORMAL LOW (ref 36.0–46.0)
Hemoglobin: 11.8 g/dL — ABNORMAL LOW (ref 12.0–15.0)
Lymphocytes Relative: 14 % (ref 12–46)
Lymphs Abs: 0.8 10*3/uL (ref 0.7–4.0)
MCHC: 33.5 g/dL (ref 30.0–36.0)
MCV: 77.9 fL — ABNORMAL LOW (ref 78.0–100.0)
Monocytes Absolute: 1 10*3/uL (ref 0.1–1.0)
Monocytes Relative: 17 % — ABNORMAL HIGH (ref 3–12)
Neutro Abs: 3.9 10*3/uL (ref 1.7–7.7)
RBC: 4.52 MIL/uL (ref 3.87–5.11)
WBC: 5.7 10*3/uL (ref 4.0–10.5)

## 2012-12-11 LAB — COMPREHENSIVE METABOLIC PANEL
Alkaline Phosphatase: 53 U/L (ref 39–117)
BUN: 8 mg/dL (ref 6–23)
CO2: 24 mEq/L (ref 19–32)
Chloride: 98 mEq/L (ref 96–112)
Creatinine, Ser: 0.9 mg/dL (ref 0.50–1.10)
GFR calc non Af Amer: 86 mL/min — ABNORMAL LOW (ref >90)
Glucose, Bld: 99 mg/dL (ref 70–99)
Potassium: 3.7 mEq/L (ref 3.5–5.1)
Total Bilirubin: 0.4 mg/dL (ref 0.3–1.2)
Total Protein: 7.1 g/dL (ref 6.0–8.3)

## 2012-12-11 LAB — MONONUCLEOSIS SCREEN: Mono Screen: NEGATIVE

## 2012-12-11 LAB — RAPID STREP SCREEN (MED CTR MEBANE ONLY): Streptococcus, Group A Screen (Direct): NEGATIVE

## 2012-12-11 MED ORDER — ONDANSETRON 4 MG PO TBDP: 4.0000 mg | ORAL_TABLET | Freq: Three times a day (TID) | ORAL | Status: DC | PRN

## 2012-12-11 MED ORDER — SODIUM CHLORIDE 0.9 % IV BOLUS (SEPSIS)
1000.0000 mL | Freq: Once | INTRAVENOUS | Status: AC
  Administered 2012-12-11: 1000 mL via INTRAVENOUS

## 2012-12-11 MED ORDER — ONDANSETRON HCL 4 MG/2ML IJ SOLN
4.0000 mg | Freq: Once | INTRAMUSCULAR | Status: AC
  Administered 2012-12-11: 4 mg via INTRAVENOUS
  Filled 2012-12-11: qty 2

## 2012-12-11 MED ORDER — KETOROLAC TROMETHAMINE 30 MG/ML IJ SOLN
30.0000 mg | Freq: Once | INTRAMUSCULAR | Status: AC
  Administered 2012-12-11: 30 mg via INTRAVENOUS
  Filled 2012-12-11: qty 1

## 2012-12-11 NOTE — ED Notes (Signed)
Pt continues to have sore throat, fever, dizziness, abdominal pain.  Pt has red swollen tonsils with large amount of exudate.

## 2012-12-11 NOTE — ED Notes (Signed)
Pt given a glass of water per MD's order.

## 2012-12-11 NOTE — ED Provider Notes (Signed)
CSN: [id number]     Arrival date & time 12/11/12  1841 History  This chart was scribed for Ethelda Chick, MD by Dorothey Baseman, ED Scribe. This patient was seen in room MH10/MH10 and the patient's care was started at 8:25 PM.    Chief Complaint  Patient presents with  . Sore Throat  . Fever  . Dizziness   Patient is a 27 y.o. female presenting with pharyngitis and fever. The history is provided by the patient. No language interpreter was used.  Sore Throat This is a new problem. The current episode started more than 1 week ago. The problem occurs constantly. The problem has been gradually worsening. Associated symptoms include abdominal pain. The symptoms are aggravated by swallowing.  Fever Max temp prior to arrival:  101 Severity:  Moderate Onset quality:  Sudden Timing:  Intermittent Progression:  Partially resolved Chronicity:  New Associated symptoms: sore throat and vomiting    HPI Comments: FELICE HOPE is a 27 y.o. female who presents to the Emergency Department complaining of sore throat, emesis, and fever (101, measured at home). Patient reports that the sore throat presented 10 days ago and has been progressively worsening and is exacerbated with swallowing. She states that the fever and emesis presented earlier today. She reports taking ibuprofen at home, but states that she threw it up. Patient reports associated dizziness and a pressure-like pain to the periumbilical abdominal region. She states that her child has recently been ill with a stomach virus. Patient reports a history of kidney stones and fibromyalgia. Patient reports an allergy to terbutaline.   Past Medical History  Diagnosis Date  . Kidney stones   . Migraines   . Raynaud disease   . Fibromyalgia   . Kidney stones   . Raynaud's disease   . Kidney stones   . Allergy    Past Surgical History  Procedure Laterality Date  . Tubal ligation    . Laparoscopy    . Tubal ligation    . Kidney stint    .  Cholecystectomy    . Breast biopsy    . Tonsillectomy     Family History  Problem Relation Age of Onset  . Hypertension Mother   . Arthritis Mother   . Breast cancer Mother 53  . Uterine cancer Mother 66  . Breast cancer Maternal Grandmother     dx in her 30s  . Throat cancer Maternal Grandfather     smoker; dx in 78s  . Melanoma Paternal Grandfather   . Melanoma Maternal Aunt     dx in her 30s   History  Substance Use Topics  . Smoking status: Never Smoker   . Smokeless tobacco: Not on file  . Alcohol Use: No   OB History   Grav Para Term Preterm Abortions TAB SAB Ect Mult Living   3 2 2  1  1   3      Review of Systems  Constitutional: Positive for fever.  HENT: Positive for sore throat.   Gastrointestinal: Positive for vomiting and abdominal pain.  ROS reviewed and all otherwise negative except for mentioned in HPI  Allergies  Terbutaline sulfate; Vitamin a; and Latex  Home Medications   Current Outpatient Rx  Name  Route  Sig  Dispense  Refill  . ciprofloxacin (CIPRO) 500 MG tablet   Oral   Take 1 tablet (500 mg total) by mouth 2 (two) times daily. One po bid x 7 days   14 tablet  0   . HYDROcodone-acetaminophen (HYCET) 7.5-325 mg/15 ml solution   Oral   Take 15 mLs by mouth every 6 (six) hours as needed for pain or cough.   200 mL   0   . ibuprofen (ADVIL,MOTRIN) 600 MG tablet   Oral   Take 1 tablet (600 mg total) by mouth every 6 (six) hours as needed for pain.   30 tablet   0   . levalbuterol (XOPENEX HFA) 45 MCG/ACT inhaler   Inhalation   Inhale 1-2 puffs into the lungs every 4 (four) hours as needed for wheezing or shortness of breath.   1 Inhaler   0   . metroNIDAZOLE (FLAGYL) 500 MG tablet   Oral   Take 1 tablet (500 mg total) by mouth 2 (two) times daily. One po bid x 7 days   14 tablet   0   . ondansetron (ZOFRAN ODT) 4 MG disintegrating tablet      4mg  ODT q4 hours prn nausea/vomit   8 tablet   0   . ondansetron (ZOFRAN ODT)  4 MG disintegrating tablet   Oral   Take 1 tablet (4 mg total) by mouth every 8 (eight) hours as needed for nausea.   20 tablet   0    Triage Vitals: BP 106/62  Pulse 80  Temp(Src) 99.4 F (37.4 C) (Oral)  Resp 16  Ht 5\' 7"  (1.702 m)  Wt 128 lb (58.06 kg)  BMI 20.04 kg/m2  SpO2 100%  LMP 11/21/2012  Physical Exam  Nursing note and vitals reviewed. Constitutional: She is oriented to person, place, and time. She appears well-developed and well-nourished. No distress.  HENT:  Head: Normocephalic and atraumatic.  Mouth/Throat: Oropharyngeal exudate present.  Bilateral tonsillar exudate with erythema. Palate is symmetric. Uvula is midline.  Eyes: Conjunctivae are normal.  Neck: Normal range of motion. Neck supple.  Cardiovascular: Normal rate, regular rhythm and normal heart sounds.   Pulmonary/Chest: Effort normal and breath sounds normal. No respiratory distress.  Abdominal: Soft. She exhibits no distension. There is tenderness.  LUQ tenderness to palpation. No hepatosplenomegaly.   Musculoskeletal: Normal range of motion.  Neurological: She is alert and oriented to person, place, and time.  Skin: Skin is warm and dry.  Psychiatric: She has a normal mood and affect. Her behavior is normal.  Note- no significant lymphadenopathy, abd- no gaurding or rebound  ED Course  Procedures (including critical care time)  DIAGNOSTIC STUDIES: Oxygen Saturation is 100% on room air, normal by my interpretation.    COORDINATION OF CARE: 8:31 PM- Will order a rapid strep test and blood labs. Will order IV fluids, Zofran, and Toradol to manage symptoms. Discussed treatment plan with patient at bedside and patient verbalized agreement.     Labs Review Labs Reviewed  CBC WITH DIFFERENTIAL - Abnormal; Notable for the following:    Hemoglobin 11.8 (*)    HCT 35.2 (*)    MCV 77.9 (*)    Monocytes Relative 17 (*)    All other components within normal limits  COMPREHENSIVE METABOLIC PANEL  - Abnormal; Notable for the following:    Sodium 134 (*)    GFR calc non Af Amer 86 (*)    All other components within normal limits  RAPID STREP SCREEN  CULTURE, GROUP A STREP  MONONUCLEOSIS SCREEN   Imaging Review Dg Chest 2 View  12/10/2012   CLINICAL DATA:  Sore throat  EXAM: CHEST  2 VIEW  COMPARISON:  12/18/2011  FINDINGS: The  heart size and mediastinal contours are within normal limits. Both lungs are clear of edema or consolidation. No effusion or pneumothorax. The visualized skeletal structures are unremarkable.  IMPRESSION: No evidence of active cardiopulmonary disease.   Electronically Signed   By: Tiburcio Pea M.D.   On: 12/10/2012 06:50    EKG Interpretation   None       MDM   1. Viral syndrome    Pt presenting with continued sore throat, vomiting, and left upper abdominal pain.  Pt has had negative strep and mono test, both of which were rechecked tonight.  She has no leukocytosis howerver monocytes are elevated.  Clinically her symptoms are c/w mononucleosis- I have advised her of this, encouraged hydration and to f/u with PMD.  Mono test may turn positive further into the illness.  Pt was treated with IV fluids, zofran, toradol.  She has no airway compromise, she is tolerating po fluids in the ED without vomiting.  Discharged with strict return precautions.  Pt agreeable with plan.  I personally performed the services described in this documentation, which was scribed in my presence. The recorded information has been reviewed and is accurate.     Ethelda Chick, MD 12/11/12 2351

## 2012-12-12 LAB — CULTURE, GROUP A STREP

## 2012-12-13 LAB — CULTURE, GROUP A STREP

## 2012-12-15 ENCOUNTER — Other Ambulatory Visit: Payer: Self-pay

## 2012-12-26 ENCOUNTER — Ambulatory Visit: Payer: 59

## 2013-04-20 LAB — CBC AND DIFFERENTIAL
HEMATOCRIT: 33 % — AB (ref 36–46)
Hemoglobin: 10.9 g/dL — AB (ref 12.0–16.0)
PLATELETS: 189 10*3/uL (ref 150–399)
WBC: 6.1 10^3/mL

## 2013-06-19 ENCOUNTER — Ambulatory Visit (HOSPITAL_COMMUNITY)
Admission: RE | Admit: 2013-06-19 | Discharge: 2013-06-19 | Disposition: A | Discharge status: Home / Self Care | Payer: 59 | Source: Ambulatory Visit | Attending: Family Medicine | Admitting: Family Medicine

## 2013-06-19 ENCOUNTER — Emergency Department (HOSPITAL_COMMUNITY)
Admission: EM | Admit: 2013-06-19 | Discharge: 2013-06-19 | Disposition: A | Discharge status: Home / Self Care | Payer: 59 | Source: Home / Self Care | Attending: Family Medicine | Admitting: Family Medicine

## 2013-06-19 ENCOUNTER — Encounter (HOSPITAL_COMMUNITY): Payer: Self-pay | Admitting: Emergency Medicine

## 2013-06-19 DIAGNOSIS — M79609 Pain in unspecified limb: Secondary | ICD-10-CM | POA: Insufficient documentation

## 2013-06-19 DIAGNOSIS — M79606 Pain in leg, unspecified: Secondary | ICD-10-CM

## 2013-06-19 MED ORDER — HYDROCODONE-ACETAMINOPHEN 5-325 MG PO TABS: 1.0000 | ORAL_TABLET | Freq: Four times a day (QID) | ORAL | Status: DC | PRN

## 2013-06-19 MED ORDER — KETOROLAC TROMETHAMINE 60 MG/2ML IM SOLN
INTRAMUSCULAR | Status: AC
  Filled 2013-06-19: qty 2

## 2013-06-19 MED ORDER — KETOROLAC TROMETHAMINE 60 MG/2ML IM SOLN
60.0000 mg | Freq: Once | INTRAMUSCULAR | Status: AC
  Administered 2013-06-19: 60 mg via INTRAMUSCULAR

## 2013-06-19 NOTE — Discharge Instructions (Signed)
The most likely diagnosis for your leg pain at this time is a blood clot in the leg. You have a venous duplex Doppler ultrasound study of her your scheduled for 4:30 PM today. Please arrival at [facility] section A2 check in by 4 PM. After your study is completed, please come back here and we will review the results and make a plan for treatment. If you develop chest pain or shortness of breath, please go immediately to the emergency department.

## 2013-06-19 NOTE — ED Notes (Signed)
C/o  Right hamstring pain.  On set 6 p.m last night.  Denies injury.  States "stood from sitting position with extreme pain in back of thigh".   Having swelling and pain with touch.   Hard to walk.  Pt has used ice and ibuprofen with no relief.

## 2013-06-19 NOTE — ED Provider Notes (Signed)
CSN: [id number]     Arrival date & time 06/19/13  1104 History   None    Chief Complaint  Patient presents with  . Leg Pain   (Consider location/radiation/quality/duration/timing/severity/associated sxs/prior Treatment) HPI Comments: 28 year old female presents complaining of right posterior leg pain. Last night, she tried to stand up after sitting for about one hour and had severe pain in the back of her thigh that felt like a cramp.  this pain started acutely and has persisted up until nowwith no relief from ice, heat, or ibuprofen. The pain is constant and her leg is extremely tender to palpation. She also has some swelling in the right lower extremity. The pain is increased with any weightbearing activity, especially when extending the leg from a flexed position. There is no numbness in the leg. She has no previous history of similar injuries or pains. No history of DVT or PE. No recent travel. She does not take any hormonal birth control. No family history of DVT or PE.   Patient is a 28 y.o. female presenting with leg pain.  Leg Pain   Past Medical History  Diagnosis Date  . Kidney stones   . Migraines   . Raynaud disease   . Fibromyalgia   . Kidney stones   . Raynaud's disease   . Kidney stones   . Allergy    Past Surgical History  Procedure Laterality Date  . Tubal ligation    . Laparoscopy    . Tubal ligation    . Kidney stint    . Cholecystectomy    . Breast biopsy    . Tonsillectomy     Family History  Problem Relation Age of Onset  . Hypertension Mother   . Arthritis Mother   . Breast cancer Mother 76  . Uterine cancer Mother 52  . Breast cancer Maternal Grandmother     dx in her 52s  . Throat cancer Maternal Grandfather     smoker; dx in 64s  . Melanoma Paternal Grandfather   . Melanoma Maternal Aunt     dx in her 57s   History  Substance Use Topics  . Smoking status: Never Smoker   . Smokeless tobacco: Not on file  . Alcohol Use: No   OB History   Grav Para Term Preterm Abortions TAB SAB Ect Mult Living   3 2 2  1  1   3      Review of Systems  Cardiovascular: Positive for leg swelling.  Musculoskeletal:       See history of present illness  All other systems reviewed and are negative.   Allergies  Terbutaline sulfate; Vitamin a; and Latex  Home Medications   Prior to Admission medications   Medication Sig Start Date End Date Taking? Authorizing Provider  ciprofloxacin (CIPRO) 500 MG tablet Take 1 tablet (500 mg total) by mouth 2 (two) times daily. One po bid x 7 days 11/30/12   Julianne Rice, MD  HYDROcodone-acetaminophen (HYCET) 7.5-325 mg/15 ml solution Take 15 mLs by mouth every 6 (six) hours as needed for pain or cough. 12/10/12   John L Molpus, MD  ibuprofen (ADVIL,MOTRIN) 600 MG tablet Take 1 tablet (600 mg total) by mouth every 6 (six) hours as needed for pain. 11/30/12   Julianne Rice, MD  levalbuterol [facility] HFA) 45 MCG/ACT inhaler Inhale 1-2 puffs into the lungs every 4 (four) hours as needed for wheezing or shortness of breath. 12/10/12   Karen Chafe Molpus, MD  metroNIDAZOLE (FLAGYL)  500 MG tablet Take 1 tablet (500 mg total) by mouth 2 (two) times daily. One po bid x 7 days 11/30/12   Julianne Rice, MD  ondansetron [facility] ODT) 4 MG disintegrating tablet 4mg  ODT q4 hours prn nausea/vomit 11/30/12   Julianne Rice, MD  ondansetron (ZOFRAN ODT) 4 MG disintegrating tablet Take 1 tablet (4 mg total) by mouth every 8 (eight) hours as needed for nausea. 12/11/12   Threasa Beards, MD   BP 110/74  Pulse 70  Temp(Src) 98.4 F (36.9 C) (Oral)  Resp 16  SpO2 100%  LMP 06/11/2013 Physical Exam  Nursing note and vitals reviewed. Constitutional: She is oriented to person, place, and time. Vital signs are normal. She appears well-developed and well-nourished. No distress.  HENT:  Head: Normocephalic and atraumatic.  Pulmonary/Chest: Effort normal. No respiratory distress.  Musculoskeletal:       Right upper leg: She  exhibits tenderness.       Legs: Distally, there is trace pitting edema over the foot and ankle of the right lower extremity which is not present on the left  Neurological: She is alert and oriented to person, place, and time. She has normal strength. Coordination normal.  Skin: Skin is warm and dry. No rash noted. She is not diaphoretic.  Psychiatric: She has a normal mood and affect. Judgment normal.    ED Course  Procedures (including critical care time) Labs Review Labs Reviewed - No data to display  Imaging Review No results found.   MDM   1. Leg pain    Most likely diagnosis is DVT. I have scheduled a venous duplex vascular ultrasound study to be performed at 4 PM today, a few hours now. We'll give an injection of Toradol to hopefully alleviate some of her symptoms in the meantime. After her ultrasound she will come back here to review results and so that we can decide on a treatment plan.   Meds ordered this encounter  Medications  . ketorolac (TORADOL) injection 60 mg    Sig:   . HYDROcodone-acetaminophen (NORCO) 5-325 MG per tablet    Sig: Take 1 tablet by mouth every 6 (six) hours as needed for moderate pain.    Dispense:  10 tablet    Refill:  0    Order Specific Question:  Supervising Provider    Answer:  Lynne Leader, [place]    Liam Graham, PA-C 06/19/13 1333   Ultrasound negative.  Probable hamstring strain/sprain.  800 mg ibuprofen, norco PRN   Liam Graham, PA-C 06/19/13 1724

## 2013-06-19 NOTE — Progress Notes (Signed)
VASCULAR LAB PRELIMINARY  PRELIMINARY  PRELIMINARY  PRELIMINARY  Right LEV completed.    Preliminary report: Right leg is negative for deep and superficial vein thrombosis.   Margarette Canada, RVT 06/19/2013, 5:09 PM

## 2013-06-22 NOTE — ED Provider Notes (Signed)
Medical screening examination/treatment/procedure(s) were performed by a resident physician or non-physician practitioner and as the supervising physician I was immediately available for consultation/collaboration.  Lynne Leader, MD    Gregor Hams, MD 06/22/13 [phone]

## 2013-07-16 ENCOUNTER — Emergency Department (HOSPITAL_BASED_OUTPATIENT_CLINIC_OR_DEPARTMENT_OTHER)
Admission: EM | Admit: 2013-07-16 | Discharge: 2013-07-16 | Disposition: A | Discharge status: Home / Self Care | Payer: 59 | Attending: Emergency Medicine | Admitting: Emergency Medicine

## 2013-07-16 ENCOUNTER — Emergency Department (HOSPITAL_BASED_OUTPATIENT_CLINIC_OR_DEPARTMENT_OTHER): Payer: 59

## 2013-07-16 ENCOUNTER — Encounter (HOSPITAL_BASED_OUTPATIENT_CLINIC_OR_DEPARTMENT_OTHER): Payer: Self-pay | Admitting: Emergency Medicine

## 2013-07-16 DIAGNOSIS — S40011A Contusion of right shoulder, initial encounter: Secondary | ICD-10-CM

## 2013-07-16 DIAGNOSIS — Z87442 Personal history of urinary calculi: Secondary | ICD-10-CM | POA: Insufficient documentation

## 2013-07-16 DIAGNOSIS — Y929 Unspecified place or not applicable: Secondary | ICD-10-CM | POA: Insufficient documentation

## 2013-07-16 DIAGNOSIS — S40019A Contusion of unspecified shoulder, initial encounter: Secondary | ICD-10-CM | POA: Insufficient documentation

## 2013-07-16 DIAGNOSIS — Z791 Long term (current) use of non-steroidal anti-inflammatories (NSAID): Secondary | ICD-10-CM | POA: Insufficient documentation

## 2013-07-16 DIAGNOSIS — Z91040 Latex allergy status: Secondary | ICD-10-CM | POA: Insufficient documentation

## 2013-07-16 DIAGNOSIS — Z8739 Personal history of other diseases of the musculoskeletal system and connective tissue: Secondary | ICD-10-CM | POA: Insufficient documentation

## 2013-07-16 DIAGNOSIS — Z792 Long term (current) use of antibiotics: Secondary | ICD-10-CM | POA: Insufficient documentation

## 2013-07-16 DIAGNOSIS — Z79899 Other long term (current) drug therapy: Secondary | ICD-10-CM | POA: Insufficient documentation

## 2013-07-16 DIAGNOSIS — Z8679 Personal history of other diseases of the circulatory system: Secondary | ICD-10-CM | POA: Insufficient documentation

## 2013-07-16 DIAGNOSIS — Y9389 Activity, other specified: Secondary | ICD-10-CM | POA: Insufficient documentation

## 2013-07-16 DIAGNOSIS — W1809XA Striking against other object with subsequent fall, initial encounter: Secondary | ICD-10-CM | POA: Insufficient documentation

## 2013-07-16 LAB — BASIC METABOLIC PANEL
BUN: 11 mg/dL (ref 6–23)
CO2: 25 mEq/L (ref 19–32)
Calcium: 9.3 mg/dL (ref 8.4–10.5)
Chloride: 103 mEq/L (ref 96–112)
Creatinine, Ser: 0.9 mg/dL (ref 0.50–1.10)
GFR calc Af Amer: >90 mL/min (ref >90)
GFR, EST NON AFRICAN AMERICAN: 86 mL/min — AB (ref >90)
Glucose, Bld: 103 mg/dL — ABNORMAL HIGH (ref 70–99)
POTASSIUM: 3.9 meq/L (ref 3.7–5.3)
Sodium: 140 mEq/L (ref 137–147)

## 2013-07-16 LAB — CBC WITH DIFFERENTIAL/PLATELET
Basophils Absolute: 0 10*3/uL (ref 0.0–0.1)
Basophils Relative: 0 % (ref 0–1)
Eosinophils Absolute: 0.1 10*3/uL (ref 0.0–0.7)
Eosinophils Relative: 2 % (ref 0–5)
HEMATOCRIT: 38.4 % (ref 36.0–46.0)
Hemoglobin: 13.1 g/dL (ref 12.0–15.0)
LYMPHS ABS: 1.6 10*3/uL (ref 0.7–4.0)
Lymphocytes Relative: 29 % (ref 12–46)
MCH: 26.1 pg (ref 26.0–34.0)
MCHC: 34.1 g/dL (ref 30.0–36.0)
MCV: 76.5 fL — AB (ref 78.0–100.0)
MONO ABS: 0.5 10*3/uL (ref 0.1–1.0)
MONOS PCT: 9 % (ref 3–12)
NEUTROS PCT: 60 % (ref 43–77)
Neutro Abs: 3.3 10*3/uL (ref 1.7–7.7)
Platelets: 211 10*3/uL (ref 150–400)
RBC: 5.02 MIL/uL (ref 3.87–5.11)
RDW: 15.2 % (ref 11.5–15.5)
WBC: 5.4 10*3/uL (ref 4.0–10.5)

## 2013-07-16 MED ORDER — HYDROCODONE-ACETAMINOPHEN 5-325 MG PO TABS: 2.0000 | ORAL_TABLET | ORAL | Status: DC | PRN

## 2013-07-16 NOTE — ED Provider Notes (Signed)
Medical screening examination/treatment/procedure(s) were performed by non-physician practitioner and as supervising physician I was immediately available for consultation/collaboration.   EKG Interpretation None       Varney Biles, MD 07/16/13 1615

## 2013-07-16 NOTE — ED Notes (Signed)
Reports falling twice on the floor yesterday.  C/o right shoulder pain.  States unsure of why she fell, was walking and ended up on the floor.

## 2013-07-16 NOTE — ED Notes (Signed)
PA at bedside.

## 2013-07-16 NOTE — ED Notes (Signed)
Iv d/c, cath intact, pt tolerated well.

## 2013-07-16 NOTE — ED Provider Notes (Signed)
CSN: [id number]     Arrival date & time 07/16/13  1347 History   First MD Initiated Contact with Patient 07/16/13 1408     Chief Complaint  Patient presents with  . Fall     (Consider location/radiation/quality/duration/timing/severity/associated sxs/prior Treatment) Patient is a 28 y.o. female presenting with fall. The history is provided by the patient. No language interpreter was used.  Fall This is a new problem. The current episode started today. The problem has been gradually worsening. Pertinent negatives include no weakness. Nothing aggravates the symptoms. She has tried nothing for the symptoms. The treatment provided moderate relief.  Pt fell and hit her shoulder.   Pt complains of swelling and pain  Past Medical History  Diagnosis Date  . Kidney stones   . Migraines   . Raynaud disease   . Fibromyalgia   . Kidney stones   . Raynaud's disease   . Kidney stones   . Allergy    Past Surgical History  Procedure Laterality Date  . Tubal ligation    . Laparoscopy    . Tubal ligation    . Kidney stint    . Breast biopsy    . Tonsillectomy    . Appendectomy    . Dilation and curettage of uterus     Family History  Problem Relation Age of Onset  . Hypertension Mother   . Arthritis Mother   . Breast cancer Mother 64  . Uterine cancer Mother 2  . Breast cancer Maternal Grandmother     dx in her 75s  . Throat cancer Maternal Grandfather     smoker; dx in 69s  . Melanoma Paternal Grandfather   . Melanoma Maternal Aunt     dx in her 23s   History  Substance Use Topics  . Smoking status: Never Smoker   . Smokeless tobacco: Not on file  . Alcohol Use: No   OB History   Grav Para Term Preterm Abortions TAB SAB Ect Mult Living   3 2 2  1  1   3      Review of Systems  Neurological: Negative for weakness.  All other systems reviewed and are negative.     Allergies  Terbutaline sulfate; Vitamin a; and Latex  Home Medications   Prior to Admission  medications   Medication Sig Start Date End Date Taking? Authorizing Provider  omeprazole (PRILOSEC) 40 MG capsule Take 40 mg by mouth daily.   Yes Historical Provider, MD  ciprofloxacin (CIPRO) 500 MG tablet Take 1 tablet (500 mg total) by mouth 2 (two) times daily. One po bid x 7 days 11/30/12   Julianne Rice, MD  HYDROcodone-acetaminophen (HYCET) 7.5-325 mg/15 ml solution Take 15 mLs by mouth every 6 (six) hours as needed for pain or cough. 12/10/12   Karen Chafe Molpus, MD  HYDROcodone-acetaminophen (NORCO) 5-325 MG per tablet Take 1 tablet by mouth every 6 (six) hours as needed for moderate pain. 06/19/13   Liam Graham, PA-C  ibuprofen (ADVIL,MOTRIN) 600 MG tablet Take 1 tablet (600 mg total) by mouth every 6 (six) hours as needed for pain. 11/30/12   Julianne Rice, MD  levalbuterol [facility] HFA) 45 MCG/ACT inhaler Inhale 1-2 puffs into the lungs every 4 (four) hours as needed for wheezing or shortness of breath. 12/10/12   John L Molpus, MD  metroNIDAZOLE (FLAGYL) 500 MG tablet Take 1 tablet (500 mg total) by mouth 2 (two) times daily. One po bid x 7 days 11/30/12   Shanon Brow  Lita Mains, MD  ondansetron (ZOFRAN ODT) 4 MG disintegrating tablet 4mg  ODT q4 hours prn nausea/vomit 11/30/12   Julianne Rice, MD  ondansetron (ZOFRAN ODT) 4 MG disintegrating tablet Take 1 tablet (4 mg total) by mouth every 8 (eight) hours as needed for nausea. 12/11/12   Threasa Beards, MD   BP 121/57  Pulse 84  Temp(Src) 98 F (36.7 C) (Oral)  Resp 18  Ht 5\' 7"  (1.702 m)  Wt 126 lb (57.153 kg)  BMI 19.73 kg/m2  SpO2 100%  LMP 06/11/2013 Physical Exam  Nursing note and vitals reviewed. Constitutional: She is oriented to person, place, and time. She appears well-developed and well-nourished.  HENT:  Head: Normocephalic and atraumatic.  Eyes: Conjunctivae and EOM are normal. Pupils are equal, round, and reactive to light.  Neck: Normal range of motion.  Cardiovascular: Normal rate.   Pulmonary/Chest: Effort  normal.  Musculoskeletal:  Tender right shoulder.   Pain with range of motion nv and ns intact  Neurological: She is alert and oriented to person, place, and time. She has normal reflexes.  Skin: Skin is warm.  Psychiatric: She has a normal mood and affect.    ED Course  Procedures (including critical care time) Labs Review Labs Reviewed  CBC WITH DIFFERENTIAL - Abnormal; Notable for the following:    MCV 76.5 (*)    All other components within normal limits  BASIC METABOLIC PANEL - Abnormal; Notable for the following:    Glucose, Bld 103 (*)    GFR calc non Af Amer 86 (*)    All other components within normal limits    Imaging Review Dg Shoulder Right  07/16/2013   CLINICAL DATA:  Fall.  Decreased range of motion.  EXAM: RIGHT SHOULDER - 2+ VIEW  COMPARISON:  Two-view chest 12/10/2012.  FINDINGS: The right shoulder is located. The clavicle is intact. No acute bone or soft tissue abnormality is evident.  IMPRESSION: Negative right shoulder radiographs.   Electronically Signed   By: Lawrence Santiago M.D.   On: 07/16/2013 14:31     EKG Interpretation None      MDM   Final diagnoses:  Contusion of shoulder, right     Shoulder  No fracture    Labs normal    Hemoglobin 13.   Pt placed in sling.   I advised follow up with Dr. Barbaraann Barthel.   Russell, PA-C 07/16/13 1549

## 2013-07-16 NOTE — Discharge Instructions (Signed)

## 2013-10-12 ENCOUNTER — Ambulatory Visit
Admission: RE | Admit: 2013-10-12 | Discharge: 2013-10-12 | Disposition: A | Discharge status: Home / Self Care | Payer: 59 | Source: Ambulatory Visit

## 2013-10-12 DIAGNOSIS — Z1231 Encounter for screening mammogram for malignant neoplasm of breast: Secondary | ICD-10-CM

## 2013-10-23 ENCOUNTER — Other Ambulatory Visit: Payer: Self-pay | Admitting: Obstetrics & Gynecology

## 2013-10-23 DIAGNOSIS — R234 Changes in skin texture: Secondary | ICD-10-CM

## 2013-10-24 ENCOUNTER — Encounter (HOSPITAL_BASED_OUTPATIENT_CLINIC_OR_DEPARTMENT_OTHER): Payer: Self-pay | Admitting: Emergency Medicine

## 2013-10-24 ENCOUNTER — Emergency Department (HOSPITAL_BASED_OUTPATIENT_CLINIC_OR_DEPARTMENT_OTHER)
Admission: EM | Admit: 2013-10-24 | Discharge: 2013-10-25 | Disposition: A | Discharge status: Home / Self Care | Payer: 59 | Attending: Emergency Medicine | Admitting: Emergency Medicine

## 2013-10-24 DIAGNOSIS — Z87442 Personal history of urinary calculi: Secondary | ICD-10-CM | POA: Insufficient documentation

## 2013-10-24 DIAGNOSIS — Z3202 Encounter for pregnancy test, result negative: Secondary | ICD-10-CM | POA: Insufficient documentation

## 2013-10-24 DIAGNOSIS — M5412 Radiculopathy, cervical region: Secondary | ICD-10-CM | POA: Insufficient documentation

## 2013-10-24 DIAGNOSIS — IMO0001 Reserved for inherently not codable concepts without codable children: Secondary | ICD-10-CM | POA: Insufficient documentation

## 2013-10-24 DIAGNOSIS — Z91040 Latex allergy status: Secondary | ICD-10-CM | POA: Diagnosis not present

## 2013-10-24 DIAGNOSIS — M549 Dorsalgia, unspecified: Secondary | ICD-10-CM | POA: Insufficient documentation

## 2013-10-24 DIAGNOSIS — Z8679 Personal history of other diseases of the circulatory system: Secondary | ICD-10-CM | POA: Insufficient documentation

## 2013-10-24 DIAGNOSIS — Z792 Long term (current) use of antibiotics: Secondary | ICD-10-CM | POA: Diagnosis not present

## 2013-10-24 HISTORY — DX: Calculus of ureter: N20.1

## 2013-10-24 NOTE — ED Notes (Signed)
Back pain. Sudden onset at work tonight she reached up and fell burning sensation.

## 2013-10-25 ENCOUNTER — Encounter (HOSPITAL_BASED_OUTPATIENT_CLINIC_OR_DEPARTMENT_OTHER): Payer: Self-pay | Admitting: Emergency Medicine

## 2013-10-25 LAB — URINALYSIS, ROUTINE W REFLEX MICROSCOPIC
BILIRUBIN URINE: NEGATIVE
Glucose, UA: NEGATIVE mg/dL
Hgb urine dipstick: NEGATIVE
Ketones, ur: NEGATIVE mg/dL
Nitrite: NEGATIVE
PH: 7 (ref 5.0–8.0)
Protein, ur: NEGATIVE mg/dL
SPECIFIC GRAVITY, URINE: 1.016 (ref 1.005–1.030)
Urobilinogen, UA: 1 mg/dL (ref 0.0–1.0)

## 2013-10-25 LAB — URINE MICROSCOPIC-ADD ON

## 2013-10-25 LAB — PREGNANCY, URINE: Preg Test, Ur: NEGATIVE

## 2013-10-25 MED ORDER — HYDROCODONE-ACETAMINOPHEN 5-325 MG PO TABS
1.0000 | ORAL_TABLET | Freq: Once | ORAL | Status: AC
  Administered 2013-10-25: 1 via ORAL
  Filled 2013-10-25: qty 1

## 2013-10-25 MED ORDER — HYDROCODONE-ACETAMINOPHEN 5-325 MG PO TABS: 1.0000 | ORAL_TABLET | Freq: Four times a day (QID) | ORAL | Status: DC | PRN

## 2013-10-25 NOTE — Discharge Instructions (Signed)

## 2013-10-25 NOTE — ED Provider Notes (Signed)
CSN: [id number]     Arrival date & time 10/24/13  2208 History   First MD Initiated Contact with Patient 10/25/13 0015     Chief Complaint  Patient presents with  . Back Pain     (Consider location/radiation/quality/duration/timing/severity/associated sxs/prior Treatment) HPI This is a 28 year old female who was reaching upwards yesterday afternoon when she felt the sudden onset of a burning, shooting pain in her left upper back. The pain radiates to the left side of the neck and left trapezius. There is moderate to severe at its worst. It is worse with movement of her left shoulder, leaning her neck to the left and with palpation. There is associated numbness of the left scapular region. There is no numbness or weakness of the left upper extremity.  Past Medical History  Diagnosis Date  . Migraines   . Raynaud disease   . Fibromyalgia   . Raynaud's disease   . Allergy   . Ureterolithiasis    Past Surgical History  Procedure Laterality Date  . Tubal ligation    . Laparoscopy    . Tubal ligation    . Breast biopsy    . Tonsillectomy    . Appendectomy    . Dilation and curettage of uterus    . Kidney stent     Family History  Problem Relation Age of Onset  . Hypertension Mother   . Arthritis Mother   . Breast cancer Mother 61  . Uterine cancer Mother 47  . Breast cancer Maternal Grandmother     dx in her 23s  . Throat cancer Maternal Grandfather     smoker; dx in 34s  . Melanoma Paternal Grandfather   . Melanoma Maternal Aunt     dx in her 64s   History  Substance Use Topics  . Smoking status: Never Smoker   . Smokeless tobacco: Not on file  . Alcohol Use: No   OB History   Grav Para Term Preterm Abortions TAB SAB Ect Mult Living   3 2 2  1  1   3      Review of Systems  All other systems reviewed and are negative.   Allergies  Terbutaline sulfate; Vitamin a; and Latex  Home Medications   Prior to Admission medications   Medication Sig Start Date End  Date Taking? Authorizing Provider  ciprofloxacin (CIPRO) 500 MG tablet Take 1 tablet (500 mg total) by mouth 2 (two) times daily. One po bid x 7 days 11/30/12   Julianne Rice, MD  HYDROcodone-acetaminophen (HYCET) 7.5-325 mg/15 ml solution Take 15 mLs by mouth every 6 (six) hours as needed for pain or cough. 12/10/12   Karen Chafe Roma Bierlein, MD  HYDROcodone-acetaminophen (NORCO) 5-325 MG per tablet Take 1 tablet by mouth every 6 (six) hours as needed for moderate pain. 06/19/13   Liam Graham, PA-C  HYDROcodone-acetaminophen (NORCO/VICODIN) 5-325 MG per tablet Take 2 tablets by mouth every 4 (four) hours as needed. 07/16/13   Fransico Meadow, PA-C  ibuprofen (ADVIL,MOTRIN) 600 MG tablet Take 1 tablet (600 mg total) by mouth every 6 (six) hours as needed for pain. 11/30/12   Julianne Rice, MD  levalbuterol [facility] HFA) 45 MCG/ACT inhaler Inhale 1-2 puffs into the lungs every 4 (four) hours as needed for wheezing or shortness of breath. 12/10/12   Owen Pagnotta L Jaydy Fitzhenry, MD  metroNIDAZOLE (FLAGYL) 500 MG tablet Take 1 tablet (500 mg total) by mouth 2 (two) times daily. One po bid x 7 days 11/30/12  Julianne Rice, MD  omeprazole (PRILOSEC) 40 MG capsule Take 40 mg by mouth daily.    Historical Provider, MD  ondansetron (ZOFRAN ODT) 4 MG disintegrating tablet 4mg  ODT q4 hours prn nausea/vomit 11/30/12   Julianne Rice, MD  ondansetron (ZOFRAN ODT) 4 MG disintegrating tablet Take 1 tablet (4 mg total) by mouth every 8 (eight) hours as needed for nausea. 12/11/12   Threasa Beards, MD   BP 119/81  Pulse 60  Temp(Src) 97.7 F (36.5 C) (Oral)  Resp 20  Ht 5\' 7"  (1.702 m)  Wt 137 lb (62.143 kg)  BMI 21.45 kg/m2  SpO2 100%  LMP 10/15/2013  Physical Exam General: Well-developed, well-nourished female in no acute distress; appearance consistent with age of record HENT: normocephalic; atraumatic Eyes: pupils equal, round and reactive to light; extraocular muscles intact Neck: supple; no pain on rotation of neck;  pain on left side of his neck on flexion of the neck to the left Heart: regular rate and rhythm Lungs: clear to auscultation bilaterally Abdomen: soft; nondistended; nontender; bowel sounds present Back: Tenderness of the left upper back Extremities: No deformity; full range of motion except left shoulder due to pain; pulses normal; pain on palpation of left trapezius and on passive range of motion of the left shoulder Neurologic: Awake, alert and oriented; motor function intact in all extremities and symmetric; no facial droop Skin: Warm and dry Psychiatric: Normal mood and affect   ED Course  Procedures (including critical care time)  MDM  Nursing notes and vitals signs, including pulse oximetry, reviewed.  Summary of this visit's results, reviewed by myself:  Labs:  Results for orders placed during the hospital encounter of 10/24/13 (from the past 24 hour(s))  URINALYSIS, ROUTINE W REFLEX MICROSCOPIC     Status: Abnormal   Collection Time    10/25/13 12:31 AM      Result Value Ref Range   Color, Urine YELLOW  YELLOW   APPearance CLOUDY (*) CLEAR   Specific Gravity, Urine 1.016  1.005 - 1.030   pH 7.0  5.0 - 8.0   Glucose, UA NEGATIVE  NEGATIVE mg/dL   Hgb urine dipstick NEGATIVE  NEGATIVE   Bilirubin Urine NEGATIVE  NEGATIVE   Ketones, ur NEGATIVE  NEGATIVE mg/dL   Protein, ur NEGATIVE  NEGATIVE mg/dL   Urobilinogen, UA 1.0  0.0 - 1.0 mg/dL   Nitrite NEGATIVE  NEGATIVE   Leukocytes, UA SMALL (*) NEGATIVE  PREGNANCY, URINE     Status: None   Collection Time    10/25/13 12:31 AM      Result Value Ref Range   Preg Test, Ur NEGATIVE  NEGATIVE  URINE MICROSCOPIC-ADD ON     Status: Abnormal   Collection Time    10/25/13 12:31 AM      Result Value Ref Range   Squamous Epithelial / LPF FEW (*) RARE   WBC, UA 3-6  <3 WBC/hpf   Bacteria, UA FEW (*) RARE   Urine-Other AMORPHOUS URATES/PHOSPHATES      Exam findings consistent with a cervical radiculopathy.     Wynetta Fines, MD 10/25/13 [phone]

## 2013-10-27 ENCOUNTER — Ambulatory Visit
Admission: RE | Admit: 2013-10-27 | Discharge: 2013-10-27 | Disposition: A | Discharge status: Home / Self Care | Payer: 59 | Source: Ambulatory Visit | Attending: Obstetrics & Gynecology | Admitting: Obstetrics & Gynecology

## 2013-10-27 DIAGNOSIS — R234 Changes in skin texture: Secondary | ICD-10-CM

## 2013-10-27 LAB — HM MAMMOGRAPHY

## 2013-10-31 ENCOUNTER — Telehealth: Payer: Self-pay | Admitting: *Deleted

## 2013-10-31 NOTE — Telephone Encounter (Signed)
Received a genetic referral from the [place] [facility].  Called and left a message for the pt to return my call so I can get her scheduled.

## 2013-11-02 ENCOUNTER — Telehealth: Payer: Self-pay | Admitting: *Deleted

## 2013-11-02 NOTE — Telephone Encounter (Signed)
Called and left a message for the pt to return my call so I can schedule a genetic appt w/ her.

## 2013-11-10 ENCOUNTER — Telehealth: Payer: Self-pay | Admitting: *Deleted

## 2013-11-10 NOTE — Telephone Encounter (Signed)
Called and left a message for the pt to return my call so I can schedule a genetic appt w/ her.

## 2013-11-15 ENCOUNTER — Telehealth: Payer: Self-pay | Admitting: *Deleted

## 2013-11-15 NOTE — Telephone Encounter (Signed)
Left a message for the pt to return my call so I can schedule a genetic appt.  Have called more than 5 times - mailed a letter for the pt to return my call.

## 2013-12-11 ENCOUNTER — Encounter (HOSPITAL_BASED_OUTPATIENT_CLINIC_OR_DEPARTMENT_OTHER): Payer: Self-pay | Admitting: Emergency Medicine

## 2014-03-21 ENCOUNTER — Emergency Department (HOSPITAL_BASED_OUTPATIENT_CLINIC_OR_DEPARTMENT_OTHER)
Admission: EM | Admit: 2014-03-21 | Discharge: 2014-03-22 | Disposition: A | Discharge status: Home / Self Care | Payer: 59 | Attending: Emergency Medicine | Admitting: Emergency Medicine

## 2014-03-21 ENCOUNTER — Emergency Department (HOSPITAL_BASED_OUTPATIENT_CLINIC_OR_DEPARTMENT_OTHER): Payer: 59

## 2014-03-21 ENCOUNTER — Encounter (HOSPITAL_BASED_OUTPATIENT_CLINIC_OR_DEPARTMENT_OTHER): Payer: Self-pay | Admitting: Emergency Medicine

## 2014-03-21 DIAGNOSIS — Z8679 Personal history of other diseases of the circulatory system: Secondary | ICD-10-CM | POA: Diagnosis not present

## 2014-03-21 DIAGNOSIS — Z8739 Personal history of other diseases of the musculoskeletal system and connective tissue: Secondary | ICD-10-CM | POA: Insufficient documentation

## 2014-03-21 DIAGNOSIS — Z87442 Personal history of urinary calculi: Secondary | ICD-10-CM | POA: Diagnosis not present

## 2014-03-21 DIAGNOSIS — Z91040 Latex allergy status: Secondary | ICD-10-CM | POA: Diagnosis not present

## 2014-03-21 DIAGNOSIS — N12 Tubulo-interstitial nephritis, not specified as acute or chronic: Secondary | ICD-10-CM | POA: Diagnosis not present

## 2014-03-21 DIAGNOSIS — Z9889 Other specified postprocedural states: Secondary | ICD-10-CM | POA: Insufficient documentation

## 2014-03-21 DIAGNOSIS — Z3202 Encounter for pregnancy test, result negative: Secondary | ICD-10-CM | POA: Diagnosis not present

## 2014-03-21 DIAGNOSIS — Z79899 Other long term (current) drug therapy: Secondary | ICD-10-CM | POA: Insufficient documentation

## 2014-03-21 DIAGNOSIS — R109 Unspecified abdominal pain: Secondary | ICD-10-CM | POA: Diagnosis present

## 2014-03-21 LAB — URINALYSIS, ROUTINE W REFLEX MICROSCOPIC
Bilirubin Urine: NEGATIVE
Glucose, UA: NEGATIVE mg/dL
Ketones, ur: NEGATIVE mg/dL
NITRITE: NEGATIVE
Protein, ur: NEGATIVE mg/dL
SPECIFIC GRAVITY, URINE: 1.012 (ref 1.005–1.030)
UROBILINOGEN UA: 1 mg/dL (ref 0.0–1.0)
pH: 7 (ref 5.0–8.0)

## 2014-03-21 LAB — CBC WITH DIFFERENTIAL/PLATELET
BASOS PCT: 1 % (ref 0–1)
Basophils Absolute: 0 10*3/uL (ref 0.0–0.1)
EOS ABS: 0.1 10*3/uL (ref 0.0–0.7)
EOS PCT: 2 % (ref 0–5)
HCT: 38.5 % (ref 36.0–46.0)
HEMOGLOBIN: 13 g/dL (ref 12.0–15.0)
LYMPHS ABS: 1.8 10*3/uL (ref 0.7–4.0)
Lymphocytes Relative: 31 % (ref 12–46)
MCH: 27.1 pg (ref 26.0–34.0)
MCHC: 33.8 g/dL (ref 30.0–36.0)
MCV: 80.2 fL (ref 78.0–100.0)
MONOS PCT: 10 % (ref 3–12)
Monocytes Absolute: 0.6 10*3/uL (ref 0.1–1.0)
Neutro Abs: 3.3 10*3/uL (ref 1.7–7.7)
Neutrophils Relative %: 56 % (ref 43–77)
PLATELETS: 220 10*3/uL (ref 150–400)
RBC: 4.8 MIL/uL (ref 3.87–5.11)
RDW: 13.1 % (ref 11.5–15.5)
WBC: 5.8 10*3/uL (ref 4.0–10.5)

## 2014-03-21 LAB — URINE MICROSCOPIC-ADD ON

## 2014-03-21 LAB — BASIC METABOLIC PANEL
ANION GAP: 4 — AB (ref 5–15)
BUN: 14 mg/dL (ref 6–23)
CALCIUM: 8.8 mg/dL (ref 8.4–10.5)
CO2: 26 mmol/L (ref 19–32)
CREATININE: 0.72 mg/dL (ref 0.50–1.10)
Chloride: 103 mmol/L (ref 96–112)
Glucose, Bld: 120 mg/dL — ABNORMAL HIGH (ref 70–99)
Potassium: 3.3 mmol/L — ABNORMAL LOW (ref 3.5–5.1)
Sodium: 133 mmol/L — ABNORMAL LOW (ref 135–145)

## 2014-03-21 LAB — PREGNANCY, URINE: PREG TEST UR: NEGATIVE

## 2014-03-21 MED ORDER — CEFTRIAXONE SODIUM 1 G IJ SOLR
INTRAMUSCULAR | Status: AC
  Filled 2014-03-21: qty 10

## 2014-03-21 MED ORDER — DEXTROSE 5 % IV SOLN
1.0000 g | Freq: Once | INTRAVENOUS | Status: AC
  Administered 2014-03-21: 1 g via INTRAVENOUS

## 2014-03-21 MED ORDER — FENTANYL CITRATE 0.05 MG/ML IJ SOLN
50.0000 ug | Freq: Once | INTRAMUSCULAR | Status: AC
  Administered 2014-03-21: 50 ug via INTRAVENOUS
  Filled 2014-03-21: qty 2

## 2014-03-21 NOTE — ED Notes (Signed)
Pt c/o left flank pain and bladder pain with painful urination.

## 2014-03-21 NOTE — ED Provider Notes (Addendum)
CSN: [id number]     Arrival date & time 03/21/14  2146 History  This chart was scribed for Ephraim Hamburger, MD by Tula Nakayama, ED Scribe. This patient was seen in room MH02/MH02 and the patient's care was started at 10:07 PM.    Chief Complaint  Patient presents with  . Flank Pain   The history is provided by the patient. No language interpreter was used.   HPI Comments: Cindy Turner is a 29 y.o. female who presents to the Emergency Department complaining of constant, moderate, aching left flank pain and sharp, intermittent suprapublic pain that started 3 days ago. The suprapubic pain started 1 week ago and then the flank started hurting. Pt states swelling of her abdomen, urinary frequency and intermittent bladder spasms as associated symptoms. Her pain becomes worse with urination and movement. She has tried Ibuprofen for her symptoms with no relief. Pt has history of kidney stones and bladder infections. She notes symptoms started consistent with prior UTIs, but are worse and higher in her abdomen today. Pt denies fever, nausea and vomiting as associated symptoms.   Past Medical History  Diagnosis Date  . Migraines   . Raynaud disease   . Fibromyalgia   . Raynaud's disease   . Allergy   . Ureterolithiasis    Past Surgical History  Procedure Laterality Date  . Tubal ligation    . Laparoscopy    . Tubal ligation    . Breast biopsy    . Tonsillectomy    . Appendectomy    . Dilation and curettage of uterus    . Kidney stent     Family History  Problem Relation Age of Onset  . Hypertension Mother   . Arthritis Mother   . Breast cancer Mother 47  . Uterine cancer Mother 36  . Breast cancer Maternal Grandmother     dx in her 75s  . Throat cancer Maternal Grandfather     smoker; dx in 28s  . Melanoma Paternal Grandfather   . Melanoma Maternal Aunt     dx in her 21s   History  Substance Use Topics  . Smoking status: Never Smoker   . Smokeless tobacco: Not on  file  . Alcohol Use: No   OB History    Gravida Para Term Preterm AB TAB SAB Ectopic Multiple Living   3 2 2  1  1   3      Review of Systems  Constitutional: Negative for fever.  Gastrointestinal: Positive for abdominal pain. Negative for nausea and vomiting.  Genitourinary: Positive for dysuria, urgency, frequency and flank pain.  All other systems reviewed and are negative.   Allergies  Terbutaline sulfate; Vitamin a; and Latex  Home Medications   Prior to Admission medications   Medication Sig Start Date End Date Taking? Authorizing Provider  HYDROcodone-acetaminophen (NORCO/VICODIN) 5-325 MG per tablet Take 1-2 tablets by mouth every 6 (six) hours as needed (for pain). 10/25/13  Yes John L Molpus, MD  ibuprofen (ADVIL,MOTRIN) 600 MG tablet Take 1 tablet (600 mg total) by mouth every 6 (six) hours as needed for pain. 11/30/12  Yes Julianne Rice, MD  ciprofloxacin (CIPRO) 500 MG tablet Take 1 tablet (500 mg total) by mouth 2 (two) times daily. One po bid x 7 days 11/30/12   Julianne Rice, MD  HYDROcodone-acetaminophen (HYCET) 7.5-325 mg/15 ml solution Take 15 mLs by mouth every 6 (six) hours as needed for pain or cough. 12/10/12   Wynetta Fines, MD  HYDROcodone-acetaminophen (NORCO) 5-325 MG per tablet Take 1 tablet by mouth every 6 (six) hours as needed for moderate pain. 06/19/13   Liam Graham, PA-C  HYDROcodone-acetaminophen (NORCO/VICODIN) 5-325 MG per tablet Take 2 tablets by mouth every 4 (four) hours as needed. 07/16/13   Fransico Meadow, PA-C  levalbuterol [facility] HFA) 45 MCG/ACT inhaler Inhale 1-2 puffs into the lungs every 4 (four) hours as needed for wheezing or shortness of breath. 12/10/12   John L Molpus, MD  metroNIDAZOLE (FLAGYL) 500 MG tablet Take 1 tablet (500 mg total) by mouth 2 (two) times daily. One po bid x 7 days 11/30/12   Julianne Rice, MD  omeprazole (PRILOSEC) 40 MG capsule Take 40 mg by mouth daily.    Historical Provider, MD  ondansetron (ZOFRAN ODT)  4 MG disintegrating tablet 4mg  ODT q4 hours prn nausea/vomit 11/30/12   Julianne Rice, MD  ondansetron (ZOFRAN ODT) 4 MG disintegrating tablet Take 1 tablet (4 mg total) by mouth every 8 (eight) hours as needed for nausea. 12/11/12   Threasa Beards, MD   BP 112/69 mmHg  Pulse 72  Temp(Src) 98.6 F (37 C) (Oral)  Resp 18  Ht 5\' 7"  (1.702 m)  Wt 128 lb (58.06 kg)  BMI 20.04 kg/m2  SpO2 100%  LMP 03/19/2014 Physical Exam  Constitutional: She appears well-developed and well-nourished. No distress.  HENT:  Head: Normocephalic and atraumatic.  Eyes: Right eye exhibits no discharge. Left eye exhibits no discharge.  Neck: No tracheal deviation present.  Cardiovascular: Normal rate, regular rhythm and normal heart sounds.   Pulmonary/Chest: Effort normal and breath sounds normal. No respiratory distress.  Abdominal: Soft. Bowel sounds are normal. There is tenderness. There is no rebound and no guarding.  Suprapubic tenderness; LLQ tenderness and left CVA tenderness  Skin: Skin is warm and dry.  Psychiatric: She has a normal mood and affect. Her behavior is normal.  Nursing note and vitals reviewed.   ED Course  Procedures (including critical care time) DIAGNOSTIC STUDIES: Oxygen Saturation is 100% on RA, normal by my interpretation.    COORDINATION OF CARE: 10:14 PM Discussed treatment plan with pt which includes UA and lab work. She agreed to plan.   Labs Review Labs Reviewed  URINALYSIS, ROUTINE W REFLEX MICROSCOPIC - Abnormal; Notable for the following:    APPearance CLOUDY (*)    Hgb urine dipstick MODERATE (*)    Leukocytes, UA LARGE (*)    All other components within normal limits  BASIC METABOLIC PANEL - Abnormal; Notable for the following:    Sodium 133 (*)    Potassium 3.3 (*)    Glucose, Bld 120 (*)    Anion gap 4 (*)    All other components within normal limits  URINE MICROSCOPIC-ADD ON - Abnormal; Notable for the following:    Bacteria, UA MANY (*)    All  other components within normal limits  URINE CULTURE  PREGNANCY, URINE  CBC WITH DIFFERENTIAL/PLATELET    Imaging Review No results found.   EKG Interpretation None      MDM   Final diagnoses:  Pyelonephritis    Patient's symptoms are most c/w pyelonephritis. Given her unilateral symptoms and severe flank pain, will get renal ultrasound to r/o obvious hydro or stone. If no significant hydro, will treat as if pyelo and recommend close f/u with PCP. Prior urine cultures reviewed, 2 prior cultures sensitive to all but amoxicillin. Treat with cipro. Care transferred to Dr. Randal Buba with ultrasound pending.  I personally performed the services described in this documentation, which was scribed in my presence. The recorded information has been reviewed and is accurate.    Ephraim Hamburger, MD 03/22/14 Shelah Lewandowsky  Ephraim Hamburger, MD 03/22/14 0030

## 2014-03-22 MED ORDER — OXYCODONE-ACETAMINOPHEN 5-325 MG PO TABS: 1.0000 | ORAL_TABLET | Freq: Three times a day (TID) | ORAL | Status: DC | PRN

## 2014-03-22 MED ORDER — FENTANYL CITRATE 0.05 MG/ML IJ SOLN
50.0000 ug | Freq: Once | INTRAMUSCULAR | Status: AC
  Administered 2014-03-22: 50 ug via INTRAVENOUS
  Filled 2014-03-22: qty 2

## 2014-03-22 MED ORDER — CIPROFLOXACIN HCL 500 MG PO TABS: 500.0000 mg | ORAL_TABLET | Freq: Two times a day (BID) | ORAL | Status: DC

## 2014-03-22 MED ORDER — KETOROLAC TROMETHAMINE 30 MG/ML IJ SOLN
30.0000 mg | Freq: Once | INTRAMUSCULAR | Status: AC
  Administered 2014-03-22: 30 mg via INTRAVENOUS
  Filled 2014-03-22: qty 1

## 2014-03-22 NOTE — Discharge Instructions (Signed)
Pyelonephritis, Adult °Pyelonephritis is a kidney infection. In general, there are 2 main types of pyelonephritis: °· Infections that come on quickly without any warning (acute pyelonephritis). °· Infections that persist for a long period of time (chronic pyelonephritis). °CAUSES  °Two main causes of pyelonephritis are: °· Bacteria traveling from the bladder to the kidney. This is a problem especially in pregnant women. The urine in the bladder can become filled with bacteria from multiple causes, including: °¨ Inflammation of the prostate gland (prostatitis). °¨ Sexual intercourse in females. °¨ Bladder infection (cystitis). °· Bacteria traveling from the bloodstream to the tissue part of the kidney. °Problems that may increase your risk of getting a kidney infection include: °· Diabetes. °· Kidney stones or bladder stones. °· Cancer. °· Catheters placed in the bladder. °· Other abnormalities of the kidney or ureter. °SYMPTOMS  °· Abdominal pain. °· Pain in the side or flank area. °· Fever. °· Chills. °· Upset stomach. °· Blood in the urine (dark urine). °· Frequent urination. °· Strong or persistent urge to urinate. °· Burning or stinging when urinating. °DIAGNOSIS  °Your caregiver may diagnose your kidney infection based on your symptoms. A urine sample may also be taken. °TREATMENT  °In general, treatment depends on how severe the infection is.  °· If the infection is mild and caught early, your caregiver may treat you with oral antibiotics and send you home. °· If the infection is more severe, the bacteria may have gotten into the bloodstream. This will require intravenous (IV) antibiotics and a hospital stay. Symptoms may include: °¨ High fever. °¨ Severe flank pain. °¨ Shaking chills. °· Even after a hospital stay, your caregiver may require you to be on oral antibiotics for a period of time. °· Other treatments may be required depending upon the cause of the infection. °HOME CARE INSTRUCTIONS  °· Take your  antibiotics as directed. Finish them even if you start to feel better. °· Make an appointment to have your urine checked to make sure the infection is gone. °· Drink enough fluids to keep your urine clear or pale yellow. °· Take medicines for the bladder if you have urgency and frequency of urination as directed by your caregiver. °SEEK IMMEDIATE MEDICAL CARE IF:  °· You have a fever or persistent symptoms for more than 2-3 days. °· You have a fever and your symptoms suddenly get worse. °· You are unable to take your antibiotics or fluids. °· You develop shaking chills. °· You experience extreme weakness or fainting. °· There is no improvement after 2 days of treatment. °MAKE SURE YOU: °· Understand these instructions. °· Will watch your condition. °· Will get help right away if you are not doing well or get worse. °Document Released: 01/26/2005 Document Revised: 07/28/2011 Document Reviewed: 07/02/2010 °Exit[employer]® Patient Information ©2015 [employer]. This information is not intended to replace advice given to you by your health care provider. Make sure you discuss any questions you have with your health care provider. ° °

## 2014-03-25 LAB — URINE CULTURE: Colony Count: >=100000

## 2014-03-26 ENCOUNTER — Telehealth (HOSPITAL_COMMUNITY): Payer: Self-pay

## 2014-03-26 NOTE — ED Notes (Signed)
Post ED Visit - Positive Culture Follow-up: Successful Patient Follow-Up  Culture assessed and recommendations reviewed by: []  Wes Carlos, Pharm.D., BCPS []  Heide Guile, Pharm.D., BCPS [x]  Alycia Rossetti, Pharm.D., BCPS []  Mondamin, Pharm.D., BCPS, AAHIVP []  Legrand Como, Pharm.D., BCPS, AAHIVP []  Hassie Bruce, Pharm.D. []  Cassie Nicole Kindred, Pharm.D.  Positive urine culture  []  Patient discharged without antimicrobial prescription and treatment is now indicated [x]  Organism is resistant to prescribed ED discharge antimicrobial []  Patient with positive blood cultures  Changes discussed with ED provider:[place] New antibiotic prescription DC cipro and change to bactrim DS 1 tab bid x 10 days Called to ?  Contacted patient, date 03/26/14, time 1253   Ileene Musa 03/26/2014, 12:52 PM

## 2014-03-26 NOTE — Progress Notes (Signed)
ED Antimicrobial Stewardship Positive Culture Follow Up   Cindy Turner is an 29 y.o. female who presented to [facility] on 03/21/2014 with a chief complaint of flank pain, dysuria  Chief Complaint  Patient presents with  . Flank Pain    Recent Results (from the past 720 hour(s))  Urine culture     Status: None   Collection Time: 03/21/14 10:21 PM  Result Value Ref Range Status   Specimen Description URINE, CLEAN CATCH  Final   Special Requests NONE  Final   Colony Count   Final    >=100,000 COLONIES/ML Performed at [facility]    Culture   Final    STAPHYLOCOCCUS SPECIES (COAGULASE NEGATIVE) Note: RIFAMPIN AND GENTAMICIN SHOULD NOT BE USED AS SINGLE DRUGS FOR TREATMENT OF STAPH INFECTIONS. Performed at [facility]    Report Status 03/25/2014 FINAL  Final   Organism ID, Bacteria STAPHYLOCOCCUS SPECIES (COAGULASE NEGATIVE)  Final      Susceptibility   Staphylococcus species (coagulase negative) - MIC*    GENTAMICIN <=0.5 SENSITIVE Sensitive     LEVOFLOXACIN 0.5 SENSITIVE Sensitive     NITROFURANTOIN <=16 SENSITIVE Sensitive     OXACILLIN 2 RESISTANT Resistant     PENICILLIN 0.25 RESISTANT Resistant     RIFAMPIN <=0.5 SENSITIVE Sensitive     TRIMETH/SULFA <=10 SENSITIVE Sensitive     VANCOMYCIN <=0.5 SENSITIVE Sensitive     TETRACYCLINE <=1 SENSITIVE Sensitive     * STAPHYLOCOCCUS SPECIES (COAGULASE NEGATIVE)    [x]  Treated with Cipro, organism may or may not be covered by the prescribed antimicrobial >> needs additional follow-up  28 YOF who presented with flank pain, pain with urination. Imaging showed no hydronephrosis. UA was positive for UTI and had minimal squamous cells. Urine cultures grew out Coag negative staph and should be treated as a true pathogen. The patient was prescribed Cipro which likely covers however it was not tested for specifically (LVQ was sensitive however is also known to have better gram positive coverage)  Needs  additional follow-up: Call patient for a symptom check - * If symptoms improving/resolving >> continue Cipro as prescribed * If still symptomatic >> d/c Cipro and change to Bactrim DS 1 tab po bid x 10 days  ED Provider: Clayton Bibles, PA-C  Lawson Radar 03/26/2014, 11:07 AM Infectious Diseases Pharmacist Phone# [phone]

## 2014-03-27 ENCOUNTER — Telehealth (HOSPITAL_BASED_OUTPATIENT_CLINIC_OR_DEPARTMENT_OTHER): Payer: Self-pay | Admitting: Emergency Medicine

## 2014-03-28 ENCOUNTER — Telehealth: Payer: Self-pay | Admitting: Emergency Medicine

## 2014-03-28 NOTE — Telephone Encounter (Signed)
+   urine culture. Unable to reach patient by phone. Letter sent

## 2014-03-28 NOTE — Telephone Encounter (Signed)
Post ED Visit - Positive Culture Follow-up: Successful Patient Follow-Up  Culture assessed and recommendations reviewed by: []  Wes [place], Pharm.D., BCPS []  Heide Guile, Pharm.D., BCPS [x]  Alycia Rossetti, Pharm.D., BCPS []  [place], Pharm.D., BCPS, AAHIVP []  Legrand Como, Pharm.D., BCPS, AAHIVP []  Hassie Bruce, Pharm.D. []  Milus Glazier, Pharm.D.  Positive Urine culture  []  Patient discharged without antimicrobial prescription and treatment is now indicated [x]  Organism is resistant to prescribed ED discharge antimicrobial []  Patient with positive blood cultures  Changes discussed with ED provider: Clayton Bibles PA New antibiotic prescription: Bactrim DS one tab PO BID x 10 days Called to [facility] [phone]  Contacted patient, date 03/28/14, time 1745 pt returned call. ID verified, notified of positive urine culture. Patient reports she is still having symptoms. RX Bactrim DS called to [facility] [phone]  Ernesta Amble 03/28/2014, 5:47 PM

## 2014-04-25 ENCOUNTER — Emergency Department (HOSPITAL_BASED_OUTPATIENT_CLINIC_OR_DEPARTMENT_OTHER)
Admission: EM | Admit: 2014-04-25 | Discharge: 2014-04-26 | Disposition: A | Discharge status: Home / Self Care | Payer: 59 | Attending: Emergency Medicine | Admitting: Emergency Medicine

## 2014-04-25 ENCOUNTER — Encounter (HOSPITAL_BASED_OUTPATIENT_CLINIC_OR_DEPARTMENT_OTHER): Payer: Self-pay

## 2014-04-25 DIAGNOSIS — Z91040 Latex allergy status: Secondary | ICD-10-CM | POA: Insufficient documentation

## 2014-04-25 DIAGNOSIS — Z792 Long term (current) use of antibiotics: Secondary | ICD-10-CM | POA: Insufficient documentation

## 2014-04-25 DIAGNOSIS — Z8679 Personal history of other diseases of the circulatory system: Secondary | ICD-10-CM | POA: Diagnosis not present

## 2014-04-25 DIAGNOSIS — R51 Headache: Secondary | ICD-10-CM | POA: Insufficient documentation

## 2014-04-25 DIAGNOSIS — Z87442 Personal history of urinary calculi: Secondary | ICD-10-CM | POA: Insufficient documentation

## 2014-04-25 DIAGNOSIS — Z79899 Other long term (current) drug therapy: Secondary | ICD-10-CM | POA: Diagnosis not present

## 2014-04-25 DIAGNOSIS — Z8739 Personal history of other diseases of the musculoskeletal system and connective tissue: Secondary | ICD-10-CM | POA: Diagnosis not present

## 2014-04-25 DIAGNOSIS — R519 Headache, unspecified: Secondary | ICD-10-CM

## 2014-04-25 MED ORDER — METOCLOPRAMIDE HCL 5 MG/ML IJ SOLN
10.0000 mg | Freq: Once | INTRAMUSCULAR | Status: AC
  Administered 2014-04-25: 10 mg via INTRAVENOUS
  Filled 2014-04-25: qty 2

## 2014-04-25 MED ORDER — SODIUM CHLORIDE 0.9 % IV SOLN
Freq: Once | INTRAVENOUS | Status: AC
  Administered 2014-04-25: 1000 mL via INTRAVENOUS

## 2014-04-25 MED ORDER — SODIUM CHLORIDE 0.9 % IV BOLUS (SEPSIS): 1000.0000 mL | Freq: Once | INTRAVENOUS | Status: AC

## 2014-04-25 MED ORDER — DIPHENHYDRAMINE HCL 50 MG/ML IJ SOLN
25.0000 mg | Freq: Once | INTRAMUSCULAR | Status: AC
Start: 2014-04-25 — End: 2014-04-25
  Administered 2014-04-25: 25 mg via INTRAVENOUS
  Filled 2014-04-25: qty 1

## 2014-04-25 NOTE — ED Notes (Signed)
Per MD orders pt placed on 100% NRB.

## 2014-04-25 NOTE — ED Notes (Signed)
C/o ha onset Friday w pressure behind rt eye,  Was seen by her md this pm, was given toradol and phenergan im  No relief

## 2014-04-25 NOTE — ED Provider Notes (Signed)
CSN: [id number]     Arrival date & time 04/25/14  2019 History  This chart was scribed for Shanon Rosser, MD by Steva Colder, ED Scribe. The patient was seen in room MH02/MH02 at 10:55 PM.     Chief Complaint  Patient presents with  . Migraine     The history is provided by the patient. No language interpreter was used.    HPI Comments: Cindy Turner is a 29 y.o. female with a medical hx of Migraines who presents to the Emergency Department complaining of Migraine onset 5 days. Pt began to have constant pressure behind her right eye yesterday. Pt Migraine is right sided and radiates down her neck. Pt reports that she was seen by her PCP tonight and was given a toradol injection and phenergan with no relief for her symptoms. Pt notes that the toradol didn't work for her, but the phenergan did. Pt has never had a HA for this long before. She states that she is having associated symptoms of nausea, light sensitivity, blurred vision. Pt has had blurred vision of her right eye before and was seen by an eye doctor. Pt has been taking Ibuprofen everyday with no relief for her symptoms. She denies vomiting, numbness, weakness, and any other symptoms.   Past Medical History  Diagnosis Date  . Migraines   . Fibromyalgia   . Raynaud's disease   . Allergy   . Ureterolithiasis    Past Surgical History  Procedure Laterality Date  . Tubal ligation    . Laparoscopy    . Tubal ligation    . Breast biopsy    . Tonsillectomy    . Appendectomy    . Dilation and curettage of uterus    . Kidney stent     Family History  Problem Relation Age of Onset  . Hypertension Mother   . Arthritis Mother   . Breast cancer Mother 44  . Uterine cancer Mother 59  . Breast cancer Maternal Grandmother     dx in her 52s  . Throat cancer Maternal Grandfather     smoker; dx in 54s  . Melanoma Paternal Grandfather   . Melanoma Maternal Aunt     dx in her 79s   History  Substance Use Topics  . Smoking  status: Never Smoker   . Smokeless tobacco: Not on file  . Alcohol Use: No   OB History    Gravida Para Term Preterm AB TAB SAB Ectopic Multiple Living   3 2 2  1  1   3      Review of Systems    A complete 10 system review of systems was obtained and all systems are negative except as noted in the HPI and PMH.    Allergies  Terbutaline sulfate; Vitamin a; and Latex  Home Medications   Prior to Admission medications   Medication Sig Start Date End Date Taking? Authorizing Provider  ibuprofen (ADVIL,MOTRIN) 600 MG tablet Take 1 tablet (600 mg total) by mouth every 6 (six) hours as needed for pain. 11/30/12  Yes Julianne Rice, MD  omeprazole (PRILOSEC) 40 MG capsule Take 40 mg by mouth daily.   Yes Historical Provider, MD  ciprofloxacin (CIPRO) 500 MG tablet Take 1 tablet (500 mg total) by mouth every 12 (twelve) hours. For 10 days 03/22/14   Sherwood Gambler, MD  HYDROcodone-acetaminophen (HYCET) 7.5-325 mg/15 ml solution Take 15 mLs by mouth every 6 (six) hours as needed for pain or cough. 12/10/12  Shanon Rosser, MD  HYDROcodone-acetaminophen (NORCO) 5-325 MG per tablet Take 1 tablet by mouth every 6 (six) hours as needed for moderate pain. 06/19/13   Liam Graham, PA-C  HYDROcodone-acetaminophen (NORCO/VICODIN) 5-325 MG per tablet Take 2 tablets by mouth every 4 (four) hours as needed. 07/16/13   Fransico Meadow, PA-C  HYDROcodone-acetaminophen (NORCO/VICODIN) 5-325 MG per tablet Take 1-2 tablets by mouth every 6 (six) hours as needed (for pain). 10/25/13   [place], MD  levalbuterol [facility] HFA) 45 MCG/ACT inhaler Inhale 1-2 puffs into the lungs every 4 (four) hours as needed for wheezing or shortness of breath. 12/10/12   Lurlie Wigen, MD  metroNIDAZOLE (FLAGYL) 500 MG tablet Take 1 tablet (500 mg total) by mouth 2 (two) times daily. One po bid x 7 days 11/30/12   Julianne Rice, MD  ondansetron (ZOFRAN ODT) 4 MG disintegrating tablet 4mg  ODT q4 hours prn nausea/vomit 11/30/12   Julianne Rice, MD  ondansetron (ZOFRAN ODT) 4 MG disintegrating tablet Take 1 tablet (4 mg total) by mouth every 8 (eight) hours as needed for nausea. 12/11/12   Alfonzo Beers, MD  oxyCODONE-acetaminophen (PERCOCET) 5-325 MG per tablet Take 1 tablet by mouth every 8 (eight) hours as needed for severe pain. 03/22/14   Sherwood Gambler, MD   BP 122/76 mmHg  Pulse 84  Temp(Src) 98 F (36.7 C) (Oral)  Resp 18  Ht 5\' 7"  (1.702 m)  Wt 132 lb (59.875 kg)  BMI 20.67 kg/m2  SpO2 100%  LMP 04/18/2014  Physical Exam General: Well-developed, well-nourished female in no acute distress; appearance consistent with age of record HENT: normocephalic; atraumatic Eyes: pupils equal, round and reactive to light; extraocular muscles intact; photophobia  Neck: supple Heart: regular rate and rhythm Lungs: clear to auscultation bilaterally Abdomen: soft; nondistended; nontender; no masses or hepatosplenomegaly; bowel sounds present Extremities: No deformity; full range of motion; pulses normal Neurologic: Awake, alert and oriented; motor function intact in all extremities and symmetric; no facial droop Skin: Warm and dry Psychiatric:  Flat affect  ED Course  Procedures (including critical care time) DIAGNOSTIC STUDIES: Oxygen Saturation is 100% on RA, normal by my interpretation.    COORDINATION OF CARE: 11:00 PM-Discussed treatment plan which includes IV fluids, reglan, and benadryl with pt at bedside and pt agreed to plan.    MDM  12:13 AM Patient significantly improved after treatment with Benadryl and Reglan IV and oxygen 100% by nonrebreather. Some of patient's symptoms are suggestive of cluster headache although the pain has been more persistent rather than recurrent.    Shanon Rosser, MD 04/26/14 [phone]

## 2014-04-25 NOTE — ED Notes (Signed)
Pt reports migraine x1 week - reports pain has progressively gotten worse - reports pain behind right eye, nausea, light sensitivity - states seen at PCP at 1600 and given IM Toradol and Promethazine with no effect.

## 2014-08-17 ENCOUNTER — Encounter (HOSPITAL_BASED_OUTPATIENT_CLINIC_OR_DEPARTMENT_OTHER): Payer: Self-pay | Admitting: *Deleted

## 2014-08-17 ENCOUNTER — Emergency Department (HOSPITAL_BASED_OUTPATIENT_CLINIC_OR_DEPARTMENT_OTHER): Payer: Commercial Managed Care - HMO

## 2014-08-17 ENCOUNTER — Emergency Department (HOSPITAL_BASED_OUTPATIENT_CLINIC_OR_DEPARTMENT_OTHER)
Admission: EM | Admit: 2014-08-17 | Discharge: 2014-08-17 | Disposition: A | Discharge status: Home / Self Care | Payer: Commercial Managed Care - HMO | Attending: Emergency Medicine | Admitting: Emergency Medicine

## 2014-08-17 DIAGNOSIS — R112 Nausea with vomiting, unspecified: Secondary | ICD-10-CM | POA: Insufficient documentation

## 2014-08-17 DIAGNOSIS — N390 Urinary tract infection, site not specified: Secondary | ICD-10-CM | POA: Diagnosis not present

## 2014-08-17 DIAGNOSIS — Z87442 Personal history of urinary calculi: Secondary | ICD-10-CM | POA: Insufficient documentation

## 2014-08-17 DIAGNOSIS — Z8679 Personal history of other diseases of the circulatory system: Secondary | ICD-10-CM | POA: Insufficient documentation

## 2014-08-17 DIAGNOSIS — Z79899 Other long term (current) drug therapy: Secondary | ICD-10-CM | POA: Insufficient documentation

## 2014-08-17 DIAGNOSIS — Z91040 Latex allergy status: Secondary | ICD-10-CM | POA: Diagnosis not present

## 2014-08-17 DIAGNOSIS — Z3202 Encounter for pregnancy test, result negative: Secondary | ICD-10-CM | POA: Insufficient documentation

## 2014-08-17 DIAGNOSIS — R109 Unspecified abdominal pain: Secondary | ICD-10-CM

## 2014-08-17 DIAGNOSIS — R1031 Right lower quadrant pain: Secondary | ICD-10-CM | POA: Insufficient documentation

## 2014-08-17 DIAGNOSIS — Z8739 Personal history of other diseases of the musculoskeletal system and connective tissue: Secondary | ICD-10-CM | POA: Diagnosis not present

## 2014-08-17 LAB — URINALYSIS, ROUTINE W REFLEX MICROSCOPIC
BILIRUBIN URINE: NEGATIVE
Glucose, UA: NEGATIVE mg/dL
Ketones, ur: NEGATIVE mg/dL
Nitrite: NEGATIVE
PH: 5.5 (ref 5.0–8.0)
Protein, ur: NEGATIVE mg/dL
Specific Gravity, Urine: 1.017 (ref 1.005–1.030)
Urobilinogen, UA: 0.2 mg/dL (ref 0.0–1.0)

## 2014-08-17 LAB — CBC WITH DIFFERENTIAL/PLATELET
Basophils Absolute: 0 10*3/uL (ref 0.0–0.1)
Basophils Relative: 1 % (ref 0–1)
EOS ABS: 0.1 10*3/uL (ref 0.0–0.7)
Eosinophils Relative: 3 % (ref 0–5)
HEMATOCRIT: 37.4 % (ref 36.0–46.0)
HEMOGLOBIN: 12.4 g/dL (ref 12.0–15.0)
LYMPHS ABS: 1.3 10*3/uL (ref 0.7–4.0)
Lymphocytes Relative: 36 % (ref 12–46)
MCH: 26.7 pg (ref 26.0–34.0)
MCHC: 33.2 g/dL (ref 30.0–36.0)
MCV: 80.4 fL (ref 78.0–100.0)
MONO ABS: 0.4 10*3/uL (ref 0.1–1.0)
MONOS PCT: 11 % (ref 3–12)
NEUTROS PCT: 49 % (ref 43–77)
Neutro Abs: 1.8 10*3/uL (ref 1.7–7.7)
Platelets: 160 10*3/uL (ref 150–400)
RBC: 4.65 MIL/uL (ref 3.87–5.11)
RDW: 13.6 % (ref 11.5–15.5)
WBC: 3.5 10*3/uL — ABNORMAL LOW (ref 4.0–10.5)

## 2014-08-17 LAB — PREGNANCY, URINE: PREG TEST UR: NEGATIVE

## 2014-08-17 LAB — BASIC METABOLIC PANEL
Anion gap: 9 (ref 5–15)
BUN: 10 mg/dL (ref 6–20)
CO2: 26 mmol/L (ref 22–32)
CREATININE: 0.8 mg/dL (ref 0.44–1.00)
Calcium: 9 mg/dL (ref 8.9–10.3)
Chloride: 101 mmol/L (ref 101–111)
Glucose, Bld: 90 mg/dL (ref 65–99)
Potassium: 3.6 mmol/L (ref 3.5–5.1)
SODIUM: 136 mmol/L (ref 135–145)

## 2014-08-17 LAB — URINE MICROSCOPIC-ADD ON

## 2014-08-17 MED ORDER — ONDANSETRON 4 MG PO TBDP: 4.0000 mg | ORAL_TABLET | Freq: Three times a day (TID) | ORAL | Status: DC | PRN

## 2014-08-17 MED ORDER — KETOROLAC TROMETHAMINE 30 MG/ML IJ SOLN
30.0000 mg | Freq: Once | INTRAMUSCULAR | Status: AC
  Administered 2014-08-17: 30 mg via INTRAVENOUS
  Filled 2014-08-17: qty 1

## 2014-08-17 MED ORDER — HYDROCODONE-ACETAMINOPHEN 5-325 MG PO TABS: 1.0000 | ORAL_TABLET | ORAL | Status: DC | PRN

## 2014-08-17 MED ORDER — ONDANSETRON HCL 4 MG/2ML IJ SOLN
4.0000 mg | Freq: Once | INTRAMUSCULAR | Status: AC
  Administered 2014-08-17: 4 mg via INTRAVENOUS
  Filled 2014-08-17: qty 2

## 2014-08-17 MED ORDER — HYDROMORPHONE HCL 1 MG/ML IJ SOLN
1.0000 mg | Freq: Once | INTRAMUSCULAR | Status: AC
  Administered 2014-08-17: 1 mg via INTRAVENOUS
  Filled 2014-08-17: qty 1

## 2014-08-17 NOTE — Discharge Instructions (Signed)
Take the prescribed medication as directed. Continue the ciprofloxacin pending urine culture. If antibiotics need to be changed, he will be notified. Follow-up with urology-- call to schedule appt. Return to the ED for new or worsening symptoms.

## 2014-08-17 NOTE — ED Notes (Addendum)
Pt c/o right flank pain x 3 days HX stones seen by PMD for same on wed Pt reports unable to void

## 2014-08-17 NOTE — ED Provider Notes (Signed)
CSN: [id number]     Arrival date & time 08/17/14  1507 History   First MD Initiated Contact with Patient 08/17/14 1610     Chief Complaint  Patient presents with  . Flank Pain     (Consider location/radiation/quality/duration/timing/severity/associated sxs/prior Treatment) Patient is a 29 y.o. female presenting with flank pain. The history is provided by the patient and medical records.  Flank Pain  This is a 29 y.o. F with hx of migraine headaches, fibromyalgia, raynaud's disease, seasonal allergies, kidney stones, presenting to the ED for right flank pain.  Patient states pain was of sudden onset 3 days ago with associated hematuria.  Was seen by PCP for suspected kidney stone and was startedo on tramadol, ciprofloxacin, and flomax.  She has been taking medications as prescribed without relief. Pain currently described as sharp and stabbing with radiation to her right groin. She reports difficulty urinating. She has continued hematuria. Some nausea/vomiting as well, was able to eat a small breakfast earlier today.  No fever or chills. Patient has required stenting in the past due to large stones. She is not currently followed by urology.  Last dose of meds at 1000 today.  Past Medical History  Diagnosis Date  . Migraines   . Fibromyalgia   . Raynaud's disease   . Allergy   . Ureterolithiasis    Past Surgical History  Procedure Laterality Date  . Tubal ligation    . Laparoscopy    . Tubal ligation    . Breast biopsy    . Tonsillectomy    . Appendectomy    . Dilation and curettage of uterus    . Kidney stent     Family History  Problem Relation Age of Onset  . Hypertension Mother   . Arthritis Mother   . Breast cancer Mother 67  . Uterine cancer Mother 45  . Breast cancer Maternal Grandmother     dx in her 21s  . Throat cancer Maternal Grandfather     smoker; dx in 18s  . Melanoma Paternal Grandfather   . Melanoma Maternal Aunt     dx in her 38s   History  Substance  Use Topics  . Smoking status: Never Smoker   . Smokeless tobacco: Not on file  . Alcohol Use: No   OB History    Gravida Para Term Preterm AB TAB SAB Ectopic Multiple Living   3 2 2  1  1   3      Review of Systems  Genitourinary: Positive for hematuria, flank pain and difficulty urinating.  All other systems reviewed and are negative.     Allergies  Terbutaline sulfate; Vitamin a; and Latex  Home Medications   Prior to Admission medications   Medication Sig Start Date End Date Taking? Authorizing Provider  tamsulosin (FLOMAX) 0.4 MG CAPS capsule Take 0.4 mg by mouth.   Yes Historical Provider, MD  traMADol (ULTRAM) 50 MG tablet Take by mouth every 6 (six) hours as needed.   Yes Historical Provider, MD  ciprofloxacin (CIPRO) 500 MG tablet Take 1 tablet (500 mg total) by mouth every 12 (twelve) hours. For 10 days 03/22/14   Sherwood Gambler, MD  HYDROcodone-acetaminophen (HYCET) 7.5-325 mg/15 ml solution Take 15 mLs by mouth every 6 (six) hours as needed for pain or cough. 12/10/12   John Molpus, MD  HYDROcodone-acetaminophen (NORCO) 5-325 MG per tablet Take 1 tablet by mouth every 6 (six) hours as needed for moderate pain. 06/19/13   Liam Graham,  PA-C  HYDROcodone-acetaminophen (NORCO/VICODIN) 5-325 MG per tablet Take 2 tablets by mouth every 4 (four) hours as needed. 07/16/13   Fransico Meadow, PA-C  HYDROcodone-acetaminophen (NORCO/VICODIN) 5-325 MG per tablet Take 1-2 tablets by mouth every 6 (six) hours as needed (for pain). 10/25/13   John Molpus, MD  ibuprofen (ADVIL,MOTRIN) 600 MG tablet Take 1 tablet (600 mg total) by mouth every 6 (six) hours as needed for pain. 11/30/12   Julianne Rice, MD  levalbuterol [facility] HFA) 45 MCG/ACT inhaler Inhale 1-2 puffs into the lungs every 4 (four) hours as needed for wheezing or shortness of breath. 12/10/12   John Molpus, MD  metroNIDAZOLE (FLAGYL) 500 MG tablet Take 1 tablet (500 mg total) by mouth 2 (two) times daily. One po bid x 7 days  11/30/12   Julianne Rice, MD  omeprazole (PRILOSEC) 40 MG capsule Take 40 mg by mouth daily.    Historical Provider, MD  ondansetron (ZOFRAN ODT) 4 MG disintegrating tablet 4mg  ODT q4 hours prn nausea/vomit 11/30/12   Julianne Rice, MD  ondansetron (ZOFRAN ODT) 4 MG disintegrating tablet Take 1 tablet (4 mg total) by mouth every 8 (eight) hours as needed for nausea. 12/11/12   Alfonzo Beers, MD  oxyCODONE-acetaminophen (PERCOCET) 5-325 MG per tablet Take 1 tablet by mouth every 8 (eight) hours as needed for severe pain. 03/22/14   Sherwood Gambler, MD   BP 110/67 mmHg  Pulse 69  Temp(Src) 98.3 F (36.8 C) (Oral)  Resp 16  Ht 5\' 7"  (1.702 m)  Wt 130 lb (58.968 kg)  BMI 20.36 kg/m2  SpO2 100%  LMP 08/03/2014   Physical Exam  Constitutional: She is oriented to person, place, and time. She appears well-developed and well-nourished. No distress.  HENT:  Head: Normocephalic and atraumatic.  Mouth/Throat: Oropharynx is clear and moist.  Eyes: Conjunctivae and EOM are normal. Pupils are equal, round, and reactive to light.  Neck: Normal range of motion. Neck supple.  Cardiovascular: Normal rate, regular rhythm and normal heart sounds.   Pulmonary/Chest: Effort normal and breath sounds normal. No respiratory distress. She has no wheezes.  Abdominal: Soft. Bowel sounds are normal. There is tenderness in the right lower quadrant. There is CVA tenderness (right). There is no guarding.    Musculoskeletal: Normal range of motion. She exhibits no edema.  Neurological: She is alert and oriented to person, place, and time.  Skin: Skin is warm and dry. She is not diaphoretic.  Psychiatric: She has a normal mood and affect.  Nursing note and vitals reviewed.   ED Course  Procedures (including critical care time) Labs Review Labs Reviewed  URINALYSIS, ROUTINE W REFLEX MICROSCOPIC (NOT AT [facility]) - Abnormal; Notable for the following:    APPearance CLOUDY (*)    Hgb urine dipstick LARGE (*)     Leukocytes, UA SMALL (*)    All other components within normal limits  CBC WITH DIFFERENTIAL/PLATELET - Abnormal; Notable for the following:    WBC 3.5 (*)    All other components within normal limits  URINE MICROSCOPIC-ADD ON - Abnormal; Notable for the following:    Squamous Epithelial / LPF FEW (*)    Bacteria, UA FEW (*)    All other components within normal limits  URINE CULTURE  PREGNANCY, URINE  BASIC METABOLIC PANEL    Imaging Review Ct Renal Stone Study  08/17/2014   CLINICAL DATA:  29 year old female with right flank pain. History of kidney stones.  EXAM: CT ABDOMEN AND PELVIS WITHOUT CONTRAST  TECHNIQUE: Multidetector CT imaging of the abdomen and pelvis was performed following the standard protocol without IV contrast.  COMPARISON:  Renal ultrasound dated 03/22/2014 and abdominal CT dated 02/03/2013.  FINDINGS: Evaluation of this exam is limited in the absence of intravenous contrast.  The visualized lung bases are clear. No intra-abdominal free air. Small free fluid within the pelvis.  The liver, gallbladder, pancreas, spleen, adrenal glands, kidneys, visualized ureters appear unremarkable. There is diffuse apparent wall thickening of the urinary bladder and perivesical stranding concerning for cystitis. Correlation with urinalysis recommended. The uterus is anteverted and appears grossly unremarkable. The ovaries appear unremarkable.  Moderate stool throughout the colon. No evidence of bowel obstruction or inflammation. Appendectomy. The visualized abdominal aorta and IVC appear grossly unremarkable. There is no lymphadenopathy.  Small fat containing umbilical hernia. The osseous structures are grossly unremarkable. No acute fracture.  IMPRESSION: No hydronephrosis or nephrolithiasis. Diffuse bladder wall thickening with paired vertical dizziness concerning for cystitis. Correlation with urinalysis recommended.  Constipation.  No bowel obstruction or inflammation.   Electronically Signed    By: Anner Crete M.D.   On: 08/17/2014 17:00     EKG Interpretation None      MDM   Final diagnoses:  Right flank pain  UTI (lower urinary tract infection)  Nausea and vomiting, vomiting of unspecified type   29 year old female here with right flank pain. Saw PCP for same, suspected kidney stone. Pain continues, now with nausea and vomiting. Patient is afebrile, nontoxic. She does have right CVA tenderness with some radiation around to right groin.  Labwork as above, renal function preserved. UA with some bacteria noted, large blood.  CT renal study obtained without evidence of kidney stones, findings concerning for cystitis. Patient was treated in the ED with Toradol, Dilaudid, and Zofran with resolution of symptoms. She is feeling much better, tolerating  PO without difficulty.  Patient is already taking ciprofloxacin, will continue this pending urine culture.  Will change pain medication to Vicodin, Add Zofran PRN. Patient is to follow-up with [facility].  Discussed plan with patient, he/she acknowledged understanding and agreed with plan of care.  Return precautions given for new or worsening symptoms.  Larene Pickett, PA-C 08/17/14 [place], MD 08/17/14 [phone]

## 2014-08-19 LAB — URINE CULTURE: Culture: 3000

## 2014-10-11 LAB — MUMPS ANTIBODY, IGG: Mumps IgG: 91.7

## 2014-10-23 ENCOUNTER — Emergency Department (HOSPITAL_COMMUNITY)
Admission: EM | Admit: 2014-10-23 | Discharge: 2014-10-23 | Disposition: A | Discharge status: Nursing Home (Includes ICF) | Payer: Commercial Managed Care - HMO | Source: Home / Self Care | Attending: Family Medicine | Admitting: Family Medicine

## 2014-10-23 ENCOUNTER — Emergency Department (HOSPITAL_COMMUNITY)
Admission: EM | Admit: 2014-10-23 | Discharge: 2014-10-24 | Disposition: A | Discharge status: Home / Self Care | Payer: Commercial Managed Care - HMO | Attending: Emergency Medicine | Admitting: Emergency Medicine

## 2014-10-23 ENCOUNTER — Emergency Department (HOSPITAL_COMMUNITY): Payer: Commercial Managed Care - HMO

## 2014-10-23 ENCOUNTER — Encounter (HOSPITAL_COMMUNITY): Payer: Self-pay

## 2014-10-23 ENCOUNTER — Encounter (HOSPITAL_COMMUNITY): Payer: Self-pay | Admitting: Emergency Medicine

## 2014-10-23 DIAGNOSIS — Z91040 Latex allergy status: Secondary | ICD-10-CM | POA: Insufficient documentation

## 2014-10-23 DIAGNOSIS — R112 Nausea with vomiting, unspecified: Secondary | ICD-10-CM | POA: Diagnosis not present

## 2014-10-23 DIAGNOSIS — R51 Headache: Secondary | ICD-10-CM | POA: Insufficient documentation

## 2014-10-23 DIAGNOSIS — R2681 Unsteadiness on feet: Secondary | ICD-10-CM | POA: Diagnosis not present

## 2014-10-23 DIAGNOSIS — Z3202 Encounter for pregnancy test, result negative: Secondary | ICD-10-CM | POA: Insufficient documentation

## 2014-10-23 DIAGNOSIS — Z8739 Personal history of other diseases of the musculoskeletal system and connective tissue: Secondary | ICD-10-CM | POA: Insufficient documentation

## 2014-10-23 DIAGNOSIS — R42 Dizziness and giddiness: Secondary | ICD-10-CM | POA: Diagnosis not present

## 2014-10-23 DIAGNOSIS — R002 Palpitations: Secondary | ICD-10-CM | POA: Diagnosis not present

## 2014-10-23 DIAGNOSIS — R079 Chest pain, unspecified: Secondary | ICD-10-CM | POA: Diagnosis not present

## 2014-10-23 DIAGNOSIS — Z8679 Personal history of other diseases of the circulatory system: Secondary | ICD-10-CM | POA: Insufficient documentation

## 2014-10-23 DIAGNOSIS — R197 Diarrhea, unspecified: Secondary | ICD-10-CM | POA: Insufficient documentation

## 2014-10-23 DIAGNOSIS — K529 Noninfective gastroenteritis and colitis, unspecified: Secondary | ICD-10-CM | POA: Diagnosis not present

## 2014-10-23 DIAGNOSIS — R55 Syncope and collapse: Secondary | ICD-10-CM | POA: Diagnosis not present

## 2014-10-23 DIAGNOSIS — R519 Headache, unspecified: Secondary | ICD-10-CM

## 2014-10-23 DIAGNOSIS — R202 Paresthesia of skin: Secondary | ICD-10-CM | POA: Insufficient documentation

## 2014-10-23 DIAGNOSIS — Z87442 Personal history of urinary calculi: Secondary | ICD-10-CM | POA: Diagnosis not present

## 2014-10-23 LAB — COMPREHENSIVE METABOLIC PANEL
ALBUMIN: 4 g/dL (ref 3.5–5.0)
ALK PHOS: 44 U/L (ref 38–126)
ALT: 17 U/L (ref 14–54)
AST: 17 U/L (ref 15–41)
Anion gap: 7 (ref 5–15)
BILIRUBIN TOTAL: 0.8 mg/dL (ref 0.3–1.2)
BUN: 8 mg/dL (ref 6–20)
CO2: 23 mmol/L (ref 22–32)
Calcium: 9 mg/dL (ref 8.9–10.3)
Chloride: 107 mmol/L (ref 101–111)
Creatinine, Ser: 0.73 mg/dL (ref 0.44–1.00)
GFR calc Af Amer: >60 mL/min (ref >60)
GFR calc non Af Amer: >60 mL/min (ref >60)
GLUCOSE: 88 mg/dL (ref 65–99)
POTASSIUM: 3.4 mmol/L — AB (ref 3.5–5.1)
Sodium: 137 mmol/L (ref 135–145)
TOTAL PROTEIN: 7.1 g/dL (ref 6.5–8.1)

## 2014-10-23 LAB — URINALYSIS, ROUTINE W REFLEX MICROSCOPIC
Bilirubin Urine: NEGATIVE
Glucose, UA: NEGATIVE mg/dL
Hgb urine dipstick: NEGATIVE
KETONES UR: 15 mg/dL — AB
NITRITE: NEGATIVE
PH: 8 (ref 5.0–8.0)
PROTEIN: NEGATIVE mg/dL
Specific Gravity, Urine: 1.011 (ref 1.005–1.030)
UROBILINOGEN UA: 1 mg/dL (ref 0.0–1.0)

## 2014-10-23 LAB — CBC WITH DIFFERENTIAL/PLATELET
BASOS PCT: 1 % (ref 0–1)
Basophils Absolute: 0 10*3/uL (ref 0.0–0.1)
EOS ABS: 0.1 10*3/uL (ref 0.0–0.7)
EOS PCT: 2 % (ref 0–5)
HCT: 33.5 % — ABNORMAL LOW (ref 36.0–46.0)
Hemoglobin: 10.9 g/dL — ABNORMAL LOW (ref 12.0–15.0)
Lymphocytes Relative: 41 % (ref 12–46)
Lymphs Abs: 1.7 10*3/uL (ref 0.7–4.0)
MCH: 25.4 pg — ABNORMAL LOW (ref 26.0–34.0)
MCHC: 32.5 g/dL (ref 30.0–36.0)
MCV: 78.1 fL (ref 78.0–100.0)
MONO ABS: 0.5 10*3/uL (ref 0.1–1.0)
MONOS PCT: 13 % — AB (ref 3–12)
Neutro Abs: 1.8 10*3/uL (ref 1.7–7.7)
Neutrophils Relative %: 43 % (ref 43–77)
Platelets: 182 10*3/uL (ref 150–400)
RBC: 4.29 MIL/uL (ref 3.87–5.11)
RDW: 12.6 % (ref 11.5–15.5)
WBC: 4.2 10*3/uL (ref 4.0–10.5)

## 2014-10-23 LAB — URINE MICROSCOPIC-ADD ON

## 2014-10-23 LAB — GLUCOSE, CAPILLARY: Glucose-Capillary: 75 mg/dL (ref 65–99)

## 2014-10-23 LAB — PHOSPHORUS: Phosphorus: 2.9 mg/dL (ref 2.5–4.6)

## 2014-10-23 LAB — MONONUCLEOSIS SCREEN: MONO SCREEN: NEGATIVE

## 2014-10-23 LAB — PREGNANCY, URINE: PREG TEST UR: NEGATIVE

## 2014-10-23 LAB — MAGNESIUM: Magnesium: 2 mg/dL (ref 1.7–2.4)

## 2014-10-23 LAB — TSH: TSH: 1.091 u[IU]/mL (ref 0.350–4.500)

## 2014-10-23 MED ORDER — ACETAMINOPHEN 500 MG PO TABS: 1000.0000 mg | ORAL_TABLET | Freq: Once | ORAL | Status: DC

## 2014-10-23 MED ORDER — SODIUM CHLORIDE 0.9 % IV SOLN
Freq: Once | INTRAVENOUS | Status: AC
  Administered 2014-10-23: 17:00:00 via INTRAVENOUS

## 2014-10-23 MED ORDER — BUTALBITAL-APAP-CAFFEINE 50-325-40 MG PO TABS
1.0000 | ORAL_TABLET | Freq: Once | ORAL | Status: AC
  Administered 2014-10-23: 1 via ORAL
  Filled 2014-10-23: qty 1

## 2014-10-23 NOTE — ED Provider Notes (Addendum)
CSN: [id number]     Arrival date & time 10/23/14  1536 History   First MD Initiated Contact with Patient 10/23/14 1623     Chief Complaint  Patient presents with  . Dizziness   (Consider location/radiation/quality/duration/timing/severity/associated sxs/prior Treatment) Patient is a 29 y.o. female presenting with dizziness. The history is provided by the patient.  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 She has a complicated history.  She arrives today with abrupt onset of dizziness and palpitations beginning at 10 AM. She was in nursing class at the time. She was brought by her husband to the urgent care center. She is in the process of having an evaluation for episodes of dizziness and erratic heart rate over a long period of time. These episodes been more frequent in the last 3 weeks, this being the third. She's had syncope on the other 2 occasions, one being at church. She says that these are associated with sometimes rapid and sometimes slow heart rates. They're associated with nausea and she has vomited at times.  Patient also has chronic abdominal pain and diarrhea which has been thought to be related to Crohn's disease. This began in childhood.  Patient has been referred recently by her primary care doctor to a cardiologist and a neurologist.  Past Medical History  Diagnosis Date  . Migraines   . Fibromyalgia   . Raynaud's disease   . Allergy   . Ureterolithiasis    Past Surgical History  Procedure Laterality Date  . Tubal ligation    . Laparoscopy    . Tubal ligation    . Breast biopsy    . Tonsillectomy    . Appendectomy    . Dilation and curettage of uterus    . Kidney stent     Family History  Problem Relation Age of Onset  . Hypertension  Mother   . Arthritis Mother   . Breast cancer Mother 93  . Uterine cancer Mother 27  . Breast cancer Maternal Grandmother     dx in her 50s  . Throat cancer Maternal Grandfather     smoker; dx in 72s  . Melanoma Paternal Grandfather   . Melanoma Maternal Aunt     dx in her 27s   Social History  Substance Use Topics  . Smoking status: Never Smoker   . Smokeless tobacco: None  . Alcohol Use: No   OB History    Gravida Para Term Preterm AB TAB SAB Ectopic Multiple Living   3 2 2  1  1   3      Review of Systems  Constitutional: Positive for diaphoresis, activity change, appetite change and fatigue.  Eyes: Negative.   Respiratory: Negative.   Cardiovascular: Positive for chest pain and palpitations. Negative for leg swelling.       Patient describes intermittent chest pains, usually in the left lateral chest. Sometimes there are sharp and sometimes dull.  Gastrointestinal: Positive for nausea, vomiting, abdominal pain and diarrhea.  Endocrine: Negative.   Genitourinary: Negative.   Musculoskeletal: Positive for arthralgias.  Neurological: Positive for dizziness, syncope, light-headedness and headaches.  Psychiatric/Behavioral: Positive for agitation.    Allergies  Terbutaline sulfate; Vitamin a; and Latex  Home Medications   Prior to Admission medications   Medication Sig Start Date End  Date Taking? Authorizing Provider  ibuprofen (ADVIL,MOTRIN) 600 MG tablet Take 1 tablet (600 mg total) by mouth every 6 (six) hours as needed for pain. 11/30/12  Yes Julianne Rice, MD  ciprofloxacin (CIPRO) 500 MG tablet Take 1 tablet (500 mg total) by mouth every 12 (twelve) hours. For 10 days 03/22/14   Sherwood Gambler, MD  HYDROcodone-acetaminophen (NORCO/VICODIN) 5-325 MG per tablet Take 1 tablet by mouth every 4 (four) hours as needed. 08/17/14   Larene Pickett, PA-C  levalbuterol [facility] HFA) 45 MCG/ACT inhaler Inhale 1-2 puffs into the lungs every 4 (four) hours as needed for wheezing  or shortness of breath. 12/10/12   John Molpus, MD  metroNIDAZOLE (FLAGYL) 500 MG tablet Take 1 tablet (500 mg total) by mouth 2 (two) times daily. One po bid x 7 days 11/30/12   Julianne Rice, MD  omeprazole (PRILOSEC) 40 MG capsule Take 40 mg by mouth daily.    Historical Provider, MD  ondansetron (ZOFRAN ODT) 4 MG disintegrating tablet Take 1 tablet (4 mg total) by mouth every 8 (eight) hours as needed for nausea. 08/17/14   Larene Pickett, PA-C  oxyCODONE-acetaminophen (PERCOCET) 5-325 MG per tablet Take 1 tablet by mouth every 8 (eight) hours as needed for severe pain. 03/22/14   Sherwood Gambler, MD  tamsulosin (FLOMAX) 0.4 MG CAPS capsule Take 0.4 mg by mouth.    Historical Provider, MD  traMADol (ULTRAM) 50 MG tablet Take by mouth every 6 (six) hours as needed.    Historical Provider, MD   Meds Ordered and Administered this Visit  Medications - No data to display  BP 119/68 mmHg  Pulse 67  Temp(Src) 99.1 F (37.3 C) (Oral)  Resp 18  SpO2 100%  LMP 10/12/2014 No data found.   Physical Exam  Constitutional: She is oriented to person, place, and time. She appears well-developed and well-nourished.  HENT:  Head: Normocephalic and atraumatic.  Right Ear: External ear normal.  Left Ear: External ear normal.  Mouth/Throat: Oropharynx is clear and moist.  Eyes: Conjunctivae and EOM are normal. Pupils are equal, round, and reactive to light.  Neck: Normal range of motion. Neck supple. No thyromegaly present.  Cardiovascular: Normal rate, regular rhythm and normal heart sounds.   Pulmonary/Chest: Effort normal and breath sounds normal.  Abdominal: Soft. Bowel sounds are normal. She exhibits no distension. There is tenderness.  Right lower quadrant tenderness  Musculoskeletal: Normal range of motion.  Lymphadenopathy:    She has no cervical adenopathy.  Neurological: She is alert and oriented to person, place, and time.  Patient very weak and needs assistance going from chair to exam  table  Skin: Skin is warm and dry.  Psychiatric: She has a normal mood and affect.  Patient has normal speech pattern but she does appear frightened.  Nursing note and vitals reviewed.   ED Course  Procedures (including critical care time) ED ECG REPORT   Date: 10/23/2014  Rate: 70  Rhythm: normal sinus rhythm  QRS Axis: normal  Intervals: normal  ST/T Wave abnormalities: normal  Conduction Disutrbances:right bundle branch block  Narrative Interpretation:   Old EKG Reviewed: none available  I have personally reviewed the EKG tracing and agree with the computerized printout as noted. When being transferred to the emergency room, patient had runs of V. tach.  MDM  This is a 29 year old woman with ongoing problems related to lightheadedness, palpitations, syncope, diarrhea. These began with her first pregnancy and have become more frequent in the last several  weeks. Further evaluation is needed, and because patient is unstable, we will transfer her to the emergency department for further evaluation  Signed, Robyn Haber M.D.    Robyn Haber, MD 10/23/14 1644  Robyn Haber, MD 10/23/14 2010

## 2014-10-23 NOTE — ED Notes (Signed)
[facility] has been called. 

## 2014-10-23 NOTE — ED Provider Notes (Signed)
CSN: [id number]     Arrival date & time 10/23/14  1732 History   First MD Initiated Contact with Patient 10/23/14 1740     Chief Complaint  Patient presents with  . Near Syncope  . Dizziness   Patient is a 29 y.o. female presenting with general illness. The history is provided by the patient and medical records. No language interpreter was used.  Illness Location:  Generalized Quality:  HA, dizziness, palpitations Severity:  Moderate Onset quality:  Sudden Duration: Minutes. Timing:  Intermittent Progression:  Waxing and waning Chronicity:  Recurrent Context:  Palpitations, lightheadedness, N/V, and HA. Ongoing for past several months. Intermittent in nature with a total of 4 episodes. Tingling of extremity during episode. Denies fever, chills, cough, SOB, vision loss, slurred speech, facial droop, or focal numbness or weakness. Seen at urgent care and referred here for further evaluation. Associated symptoms: diarrhea, headaches, nausea and vomiting   Associated symptoms: no abdominal pain, no cough, no fever and no shortness of breath     Past Medical History  Diagnosis Date  . Migraines   . Fibromyalgia   . Raynaud's disease   . Allergy   . Ureterolithiasis    Past Surgical History  Procedure Laterality Date  . Tubal ligation    . Laparoscopy    . Tubal ligation    . Breast biopsy    . Tonsillectomy    . Appendectomy    . Dilation and curettage of uterus    . Kidney stent     Family History  Problem Relation Age of Onset  . Hypertension Mother   . Arthritis Mother   . Breast cancer Mother 74  . Uterine cancer Mother 36  . Breast cancer Maternal Grandmother     dx in her 28s  . Throat cancer Maternal Grandfather     smoker; dx in 23s  . Melanoma Paternal Grandfather   . Melanoma Maternal Aunt     dx in her 80s   Social History  Substance Use Topics  . Smoking status: Never Smoker   . Smokeless tobacco: None  . Alcohol Use: No   OB History    Gravida  Para Term Preterm AB TAB SAB Ectopic Multiple Living   3 2 2  1  1   3       Review of Systems  Constitutional: Negative for fever and chills.  Respiratory: Negative for cough and shortness of breath.   Cardiovascular: Positive for palpitations.  Gastrointestinal: Positive for nausea, vomiting and diarrhea. Negative for abdominal pain.  Neurological: Positive for dizziness, light-headedness and headaches. Negative for tremors, seizures, facial asymmetry, speech difficulty, weakness and numbness.  All other systems reviewed and are negative.   Allergies  Terbutaline sulfate; Vitamin a; and Latex  Home Medications   Prior to Admission medications   Medication Sig Start Date End Date Taking? Authorizing Provider  ibuprofen (ADVIL,MOTRIN) 200 MG tablet Take 200 mg by mouth every 6 (six) hours as needed for headache, mild pain or moderate pain.   Yes Historical Provider, MD  ciprofloxacin (CIPRO) 500 MG tablet Take 1 tablet (500 mg total) by mouth every 12 (twelve) hours. For 10 days Patient not taking: Reported on 10/23/2014 03/22/14   Sherwood Gambler, MD  HYDROcodone-acetaminophen (NORCO/VICODIN) 5-325 MG per tablet Take 1 tablet by mouth every 4 (four) hours as needed. Patient not taking: Reported on 10/23/2014 08/17/14   Larene Pickett, PA-C  ibuprofen (ADVIL,MOTRIN) 600 MG tablet Take 1 tablet (600 mg total)  by mouth every 6 (six) hours as needed for pain. Patient not taking: Reported on 10/23/2014 11/30/12   Julianne Rice, MD  levalbuterol [facility] HFA) 45 MCG/ACT inhaler Inhale 1-2 puffs into the lungs every 4 (four) hours as needed for wheezing or shortness of breath. Patient not taking: Reported on 10/23/2014 12/10/12   Shanon Rosser, MD  metroNIDAZOLE (FLAGYL) 500 MG tablet Take 1 tablet (500 mg total) by mouth 2 (two) times daily. One po bid x 7 days Patient not taking: Reported on 10/23/2014 11/30/12   Julianne Rice, MD  ondansetron (ZOFRAN ODT) 4 MG disintegrating tablet Take 1 tablet  (4 mg total) by mouth every 8 (eight) hours as needed for nausea. Patient not taking: Reported on 10/23/2014 08/17/14   Larene Pickett, PA-C  oxyCODONE-acetaminophen (PERCOCET) 5-325 MG per tablet Take 1 tablet by mouth every 8 (eight) hours as needed for severe pain. Patient not taking: Reported on 10/23/2014 03/22/14   Sherwood Gambler, MD   BP 123/71 mmHg  Pulse 58  Temp(Src) 98.2 F (36.8 C) (Oral)  Resp 11  SpO2 100%  LMP 10/12/2014   Physical Exam  Constitutional: She is oriented to person, place, and time. She appears well-developed and well-nourished. No distress.  HENT:  Head: Normocephalic and atraumatic.  Eyes: Conjunctivae are normal. Pupils are equal, round, and reactive to light.  Neck: Normal range of motion. Neck supple.  Cardiovascular: Normal rate, regular rhythm and intact distal pulses.   Pulmonary/Chest: Effort normal and breath sounds normal.  Abdominal: Soft. Bowel sounds are normal.  Musculoskeletal: Normal range of motion.  Neurological: She is alert and oriented to person, place, and time. She has normal reflexes. She displays normal reflexes. No cranial nerve deficit. She exhibits normal muscle tone. Coordination normal.  Skin: Skin is warm and dry. She is not diaphoretic.    ED Course  Procedures   Labs Review  Labs Reviewed  COMPREHENSIVE METABOLIC PANEL - Abnormal; Notable for the following:    Potassium 3.4 (*)    All other components within normal limits  URINALYSIS, ROUTINE W REFLEX MICROSCOPIC (NOT AT [facility]) - Abnormal; Notable for the following:    Ketones, ur 15 (*)    Leukocytes, UA TRACE (*)    All other components within normal limits  URINE MICROSCOPIC-ADD ON - Abnormal; Notable for the following:    Squamous Epithelial / LPF FEW (*)    All other components within normal limits  CBC WITH DIFFERENTIAL/PLATELET - Abnormal; Notable for the following:    Hemoglobin 10.9 (*)    HCT 33.5 (*)    MCH 25.4 (*)    Monocytes Relative 13 (*)    All  other components within normal limits  MAGNESIUM  PHOSPHORUS  TSH  MONONUCLEOSIS SCREEN  PREGNANCY, URINE  CBC WITH DIFFERENTIAL/PLATELET   Imaging Review Dg Chest 2 View  10/23/2014   CLINICAL DATA:  29 year old female with lightheadedness.  EXAM: CHEST  2 VIEW  COMPARISON:  Radiograph dated 12/10/2012  FINDINGS: The heart size and mediastinal contours are within normal limits. Both lungs are clear. The visualized skeletal structures are unremarkable.  IMPRESSION: No active cardiopulmonary disease.   Electronically Signed   By: Anner Crete M.D.   On: 10/23/2014 19:27   I have personally reviewed and evaluated these images and lab results as part of my medical decision-making.   EKG Interpretation   Date/Time:  Tuesday October 23 2014 17:42:11 EDT Ventricular Rate:  74 PR Interval:  116 QRS Duration: 83 QT Interval:  398 QTC Calculation: 442 R Axis:   90 Text Interpretation:  Sinus rhythm Borderline short PR interval Borderline  right axis deviation RSR' in V1 or V2, probably normal variant Nonspecific  T abnrm, anterolateral leads No significant change since last tracing  Confirmed by FLOYD MD, Quillian Quince [phone]) on 10/23/2014 6:25:43 PM      MDM  Ms. Mcroy is a 29 yo female with above-stated past medical history presenting with palpitations, lightheadedness, N/V, and HA. Ongoing for past several months. Intermittent in nature with a total of 4 episodes. Tingling of extremity during episode. Denies fever, chills, cough, SOB, vision loss, slurred speech, facial droop, or focal numbness or weakness. Seen at urgent care and referred here for further evaluation.  Exam above notable for young female lying in stretcher in no acute distress. Afebrile. Heart rate 50 to 60s. Normotensive. Not tachypneic. Breathing well on room air and maintaining saturations without supplemental oxygen. Neuro exam nonfocal. CV with regular rhythm.  EKG showing normal sinus rhythm without evidence of  ST elevation or depression and no changes from previous EKG. No evidence of leukocytosis. Hemoglobin 10.9 which appears to be near patient's baseline (history of metromenorrhagia). UA negative for infection or blood or severe dehydration. CMP as well as magnesium and phosphorus unremarkable. TSH wnl and mono screen negative. Chest x-ray showing no acute cardiopulmonary process.  Unclear etiology of patient's symptoms at this time. Initial screening showing no emergent processes. Patient already has follow-up with cardiology and neurology. Believe the patient would benefit from event monitor - discussed with this patient at length. Patient discharged home in stable condition. Strict ED return precautions discussed. Patient understands and agrees with plan has no further questions or concerns at this time.  Patient care discussed with followed by my attending, Dr. Deno Etienne.  Final diagnoses:  Vertigo  Lightheadedness  Acute nonintractable headache, unspecified headache type    Mayer Camel, MD 10/24/14 [place], DO 10/24/14 0938

## 2014-10-23 NOTE — ED Notes (Signed)
Pt back from x-ray.

## 2014-10-23 NOTE — ED Notes (Signed)
Pt. Presents from [facility] with complaint of dizziness/near syncope x 3 weeks. Pt. States dizziness is intermittent and associated with nausea, diaphoresis. Pt. Witness by [facility] to have near syncopal episode. CBG 75. Pt. AxO x4. Pt. Received 500 ml bolus.

## 2014-10-23 NOTE — ED Notes (Signed)
C/o couple of weeks duration of episodic right sided HA, nausea, shaking, weakness, passed out last week in church, feels as if she cannot get her breath. States today's episode started ~10 AM in school,  hands sweating, drove herself home from school

## 2015-01-01 ENCOUNTER — Emergency Department (HOSPITAL_COMMUNITY)
Admission: EM | Admit: 2015-01-01 | Discharge: 2015-01-01 | Disposition: A | Discharge status: Home / Self Care | Payer: Commercial Managed Care - HMO | Source: Home / Self Care | Attending: Emergency Medicine | Admitting: Emergency Medicine

## 2015-01-01 ENCOUNTER — Encounter (HOSPITAL_COMMUNITY): Payer: Self-pay | Admitting: *Deleted

## 2015-01-01 DIAGNOSIS — B349 Viral infection, unspecified: Secondary | ICD-10-CM | POA: Diagnosis not present

## 2015-01-01 DIAGNOSIS — G43009 Migraine without aura, not intractable, without status migrainosus: Secondary | ICD-10-CM

## 2015-01-01 DIAGNOSIS — R0789 Other chest pain: Secondary | ICD-10-CM

## 2015-01-01 MED ORDER — DEXAMETHASONE SODIUM PHOSPHATE 10 MG/ML IJ SOLN
INTRAMUSCULAR | Status: AC
  Filled 2015-01-01: qty 1

## 2015-01-01 MED ORDER — HYDROCODONE-ACETAMINOPHEN 5-325 MG PO TABS
2.0000 | ORAL_TABLET | Freq: Once | ORAL | Status: AC
Start: 2015-01-01 — End: 2015-01-01
  Administered 2015-01-01: 2 via ORAL

## 2015-01-01 MED ORDER — METOCLOPRAMIDE HCL 5 MG/ML IJ SOLN
10.0000 mg | Freq: Once | INTRAMUSCULAR | Status: AC
  Administered 2015-01-01: 10 mg via INTRAMUSCULAR

## 2015-01-01 MED ORDER — KETOROLAC TROMETHAMINE 60 MG/2ML IM SOLN
60.0000 mg | Freq: Once | INTRAMUSCULAR | Status: AC
  Administered 2015-01-01: 60 mg via INTRAMUSCULAR

## 2015-01-01 MED ORDER — GI COCKTAIL ~~LOC~~
ORAL | Status: AC
Start: 2015-01-01 — End: 2015-01-01
  Filled 2015-01-01: qty 30

## 2015-01-01 MED ORDER — METOCLOPRAMIDE HCL 5 MG/ML IJ SOLN
INTRAMUSCULAR | Status: AC
  Filled 2015-01-01: qty 2

## 2015-01-01 MED ORDER — GI COCKTAIL ~~LOC~~
30.0000 mL | Freq: Once | ORAL | Status: AC
  Administered 2015-01-01: 30 mL via ORAL

## 2015-01-01 MED ORDER — KETOROLAC TROMETHAMINE 60 MG/2ML IM SOLN
INTRAMUSCULAR | Status: AC
Start: 2015-01-01 — End: 2015-01-01
  Filled 2015-01-01: qty 2

## 2015-01-01 MED ORDER — HYDROCODONE-ACETAMINOPHEN 5-325 MG PO TABS
ORAL_TABLET | ORAL | Status: AC
Start: 2015-01-01 — End: 2015-01-01
  Filled 2015-01-01: qty 2

## 2015-01-01 MED ORDER — DEXAMETHASONE SODIUM PHOSPHATE 10 MG/ML IJ SOLN
10.0000 mg | Freq: Once | INTRAMUSCULAR | Status: AC
  Administered 2015-01-01: 10 mg via INTRAMUSCULAR

## 2015-01-01 NOTE — ED Notes (Signed)
Pt       Reports    Symptoms  Of        Chest  Pain  Today      With  Weakness  X  Several  Days       Pt  Reports  History   Of     irreg  Heartbeats       In  The  Past

## 2015-01-01 NOTE — Discharge Instructions (Signed)
You likely have a virus. Make sure you're drinking plenty of fluids. Use your Zofran as needed for nausea. The diarrhea will need to run its course. This typically last 7-10 days. Follow-up as needed.

## 2015-01-01 NOTE — ED Provider Notes (Signed)
CSN: [id number]     Arrival date & time 01/01/15  1534 History   First MD Initiated Contact with Patient 01/01/15 1619     Chief Complaint  Patient presents with  . Chest Pain   (Consider location/radiation/quality/duration/timing/severity/associated sxs/prior Treatment) HPI  She is a 29 year old woman here for evaluation of chest pain. She states for the last 2-3 days she has not felt well. She reports decreased energy levels and feeling "spacey."  She describes nonbloody diarrhea as well as some nausea. No vomiting. Today, she developed a pressure sensation in the sternal area. She states at work her blood pressure was 130/77 and pulse rate was 115.  She denies any fevers or chills. No nasal congestion or rhinorrhea. No sore throat. No new restaurants or questionable food sources. She does describe intermittent brief episodes of dizziness. She states this is very typical for her when she gets sick.   She also reports a migraine headache for the last 2 days. She states it is her typical migraine. She's been taking ibuprofen, Tylenol, and Benadryl at home with temporary improvement.  Past Medical History  Diagnosis Date  . Migraines   . Fibromyalgia   . Raynaud's disease   . Allergy   . Ureterolithiasis    Past Surgical History  Procedure Laterality Date  . Tubal ligation    . Laparoscopy    . Tubal ligation    . Breast biopsy    . Tonsillectomy    . Appendectomy    . Dilation and curettage of uterus    . Kidney stent     Family History  Problem Relation Age of Onset  . Hypertension Mother   . Arthritis Mother   . Breast cancer Mother 79  . Uterine cancer Mother 79  . Breast cancer Maternal Grandmother     dx in her 68s  . Throat cancer Maternal Grandfather     smoker; dx in 74s  . Melanoma Paternal Grandfather   . Melanoma Maternal Aunt     dx in her 38s   Social History  Substance Use Topics  . Smoking status: Never Smoker   . Smokeless tobacco: None  . Alcohol Use:  No   OB History    Gravida Para Term Preterm AB TAB SAB Ectopic Multiple Living   3 2 2  1  1   3      Review of Systems As in history of present illness Allergies  Terbutaline sulfate; Vitamin a; and Latex  Home Medications   Prior to Admission medications   Medication Sig Start Date End Date Taking? Authorizing Provider  ibuprofen (ADVIL,MOTRIN) 200 MG tablet Take 200 mg by mouth every 6 (six) hours as needed for headache, mild pain or moderate pain.    Historical Provider, MD   Meds Ordered and Administered this Visit   Medications  HYDROcodone-acetaminophen (NORCO/VICODIN) 5-325 MG per tablet 2 tablet (not administered)  ketorolac (TORADOL) injection 60 mg (60 mg Intramuscular Given 01/01/15 1720)  gi cocktail (Maalox,Lidocaine,Donnatal) (30 mLs Oral Given 01/01/15 1721)  dexamethasone (DECADRON) injection 10 mg (10 mg Intramuscular Given 01/01/15 1721)  metoCLOPramide (REGLAN) injection 10 mg (10 mg Intramuscular Given 01/01/15 1721)    BP 114/73 mmHg  Pulse 79  Temp(Src) 97.9 F (36.6 C) (Oral)  Resp 16  SpO2 100% Orthostatic VS for the past 24 hrs:  BP- Lying Pulse- Lying BP- Sitting Pulse- Sitting BP- Standing at 0 minutes Pulse- Standing at 0 minutes  01/01/15 1620 108/63 mmHg 74 117/70  mmHg 92 121/73 mmHg 82    Physical Exam  Constitutional: She is oriented to person, place, and time. She appears well-developed and well-nourished. No distress.  HENT:  Mouth/Throat: Oropharynx is clear and moist. No oropharyngeal exudate.  Neck: Neck supple. No thyromegaly present.  Cardiovascular: Normal rate, regular rhythm and normal heart sounds.   No murmur heard. Pulmonary/Chest: Effort normal and breath sounds normal. No respiratory distress. She has no wheezes. She has no rales. She exhibits no tenderness.  Abdominal: Soft. Bowel sounds are normal. She exhibits no distension. There is no tenderness. There is no rebound and no guarding.  Lymphadenopathy:    She has no  cervical adenopathy.  Neurological: She is alert and oriented to person, place, and time.    ED Course  Procedures (including critical care time) ED ECG REPORT   Date: 01/01/2015  Rate: 67  Rhythm: normal sinus rhythm  QRS Axis: normal  Intervals: normal  ST/T Wave abnormalities: normal  Conduction Disutrbances:incomplete RBBB  Narrative Interpretation: NSR with incomplete RBBB, no change from prior EKGs  Old EKG Reviewed: unchanged  I have personally reviewed the EKG tracing and agree with the computerized printout as noted.   Labs Review Labs Reviewed - No data to display  Imaging Review No results found.    MDM   1. Viral illness   2. Migraine without aura and without status migrainosus, not intractable   3. Atypical chest pain    EKG normal. Migraine cocktail and GI cocktail were minimally effective. We'll give 2 Norco as her husband is coming to pick her up. Discussed symptomatic treatment. Follow-up as needed.    Melony Overly, MD 01/01/15 1739

## 2015-02-15 ENCOUNTER — Encounter (HOSPITAL_COMMUNITY): Payer: Self-pay | Admitting: Emergency Medicine

## 2015-02-15 ENCOUNTER — Emergency Department (HOSPITAL_COMMUNITY)
Admission: EM | Admit: 2015-02-15 | Discharge: 2015-02-15 | Disposition: A | Discharge status: Home / Self Care | Payer: Commercial Managed Care - HMO | Source: Home / Self Care | Attending: Family Medicine | Admitting: Family Medicine

## 2015-02-15 DIAGNOSIS — B029 Zoster without complications: Secondary | ICD-10-CM | POA: Diagnosis not present

## 2015-02-15 MED ORDER — OXYCODONE-ACETAMINOPHEN 5-325 MG PO TABS: 1.0000 | ORAL_TABLET | ORAL | Status: DC | PRN

## 2015-02-15 MED ORDER — VALACYCLOVIR HCL 1 G PO TABS: 1000.0000 mg | ORAL_TABLET | Freq: Three times a day (TID) | ORAL | Status: DC

## 2015-02-15 NOTE — ED Notes (Signed)
C/o back pain onset 2 days... She denies inj/trauma Pain increases w/activity and pressure... Reports bra bothers her and describes pain as "flamming" Took 7 of the 200 mg of ibu this am w/no relief Slow gait... A&O x4... No acute distress.

## 2015-02-15 NOTE — Discharge Instructions (Signed)
Shingles  Try the OTC 4% lidocaine patches applied to the most tender areas  Shingles Shingles, which is also known as herpes zoster, is an infection that causes a painful skin rash and fluid-filled blisters. Shingles is not related to genital herpes, which is a sexually transmitted infection.   Shingles only develops in people who:  Have had chickenpox.  Have received the chickenpox vaccine. (This is rare.) CAUSES Shingles is caused by varicella-zoster virus (VZV). This is the same virus that causes chickenpox. After exposure to VZV, the virus stays in the body in an inactive (dormant) state. Shingles develops if the virus reactivates. This can happen many years after the initial exposure to VZV. It is not known what causes this virus to reactivate. RISK FACTORS People who have had chickenpox or received the chickenpox vaccine are at risk for shingles. Infection is more common in people who:  Are older than age 10.  Have a weakened defense (immune) system, such as those with HIV, AIDS, or cancer.  Are taking medicines that weaken the immune system, such as transplant medicines.  Are under great stress. SYMPTOMS Early symptoms of this condition include itching, tingling, and pain in an area on your skin. Pain may be described as burning, stabbing, or throbbing. A few days or weeks after symptoms start, a painful red rash appears, usually on one side of the body in a bandlike or beltlike pattern. The rash eventually turns into fluid-filled blisters that break open, scab over, and dry up in about 2-3 weeks. At any time during the infection, you may also develop:  A fever.  Chills.  A headache.  An upset stomach. DIAGNOSIS This condition is diagnosed with a skin exam. Sometimes, skin or fluid samples are taken from the blisters before a diagnosis is made. These samples are examined under a microscope or sent to a lab for testing. TREATMENT There is no specific cure for this  condition. Your health care provider will probably prescribe medicines to help you manage pain, recover more quickly, and avoid long-term problems. Medicines may include:  Antiviral drugs.  Anti-inflammatory drugs.  Pain medicines. If the area involved is on your face, you may be referred to a specialist, such as an eye doctor (ophthalmologist) or an ear, nose, and throat (ENT) doctor to help you avoid eye problems, chronic pain, or disability. HOME CARE INSTRUCTIONS Medicines  Take medicines only as directed by your health care provider.  Apply an anti-itch or numbing cream to the affected area as directed by your health care provider. Blister and Rash Care  Take a cool bath or apply cool compresses to the area of the rash or blisters as directed by your health care provider. This may help with pain and itching.  Keep your rash covered with a loose bandage (dressing). Wear loose-fitting clothing to help ease the pain of material rubbing against the rash.  Keep your rash and blisters clean with mild soap and cool water or as directed by your health care provider.  Check your rash every day for signs of infection. These include redness, swelling, and pain that lasts or increases.  Do not pick your blisters.  Do not scratch your rash. General Instructions  Rest as directed by your health care provider.  Keep all follow-up visits as directed by your health care provider. This is important.  Until your blisters scab over, your infection can cause chickenpox in people who have never had it or been vaccinated against it. To prevent this  from happening, avoid contact with other people, especially:  Babies.  Pregnant women.  Children who have eczema.  Elderly people who have transplants.  People who have chronic illnesses, such as leukemia or AIDS. SEEK MEDICAL CARE IF:  Your pain is not relieved with prescribed medicines.  Your pain does not get better after the rash  heals.  Your rash looks infected. Signs of infection include redness, swelling, and pain that lasts or increases. SEEK IMMEDIATE MEDICAL CARE IF:  The rash is on your face or nose.  You have facial pain, pain around your eye area, or loss of feeling on one side of your face.  You have ear pain or you have ringing in your ear.  You have loss of taste.  Your condition gets worse.   This information is not intended to replace advice given to you by your health care provider. Make sure you discuss any questions you have with your health care provider.   Document Released: 01/26/2005 Document Revised: 02/16/2014 Document Reviewed: 12/07/2013 Elsevier Interactive Patient Education 2016 [employer]  Neuropathic Pain Neuropathic pain is pain caused by damage to the nerves that are responsible for certain sensations in your body (sensory nerves). The pain can be caused by damage to:   The sensory nerves that send signals to your spinal cord and brain (peripheral nervous system).  The sensory nerves in your brain or spinal cord (central nervous system). Neuropathic pain can make you more sensitive to pain. What would be a minor sensation for most people may feel very painful if you have neuropathic pain. This is usually a long-term condition that can be difficult to treat. The type of pain can differ from person to person. It may start suddenly (acute), or it may develop slowly and last for a long time (chronic). Neuropathic pain may come and go as damaged nerves heal or may stay at the same level for years. It often causes emotional distress, loss of sleep, and a lower quality of life. CAUSES  The most common cause of damage to a sensory nerve is diabetes. Many other diseases and conditions can also cause neuropathic pain. Causes of neuropathic pain can be classified as:  Toxic. Many drugs and chemicals can cause toxic damage. The most common cause of toxic neuropathic pain is damage from  drug treatment for cancer (chemotherapy).  Metabolic. This type of pain can happen when a disease causes imbalances that damage nerves. Diabetes is the most common of these diseases. Vitamin B deficiency caused by long-term alcohol abuse is another common cause.  Traumatic. Any injury that cuts, crushes, or stretches a nerve can cause damage and pain. A common example is feeling pain after losing an arm or leg (phantom limb pain).  Compression-related. If a sensory nerve gets trapped or compressed for a long period of time, the blood supply to the nerve can be cut off.  Vascular. Many blood vessel diseases can cause neuropathic pain by decreasing blood supply and oxygen to nerves.  Autoimmune. This type of pain results from diseases in which the body's defense system mistakenly attacks sensory nerves. Examples of autoimmune diseases that can cause neuropathic pain include lupus and multiple sclerosis.  Infectious. Many types of viral infections can damage sensory nerves and cause pain. Shingles infection is a common cause of this type of pain.  Inherited. Neuropathic pain can be a symptom of many diseases that are passed down through families (genetic). SIGNS AND SYMPTOMS  The main symptom is pain. Neuropathic  pain is often described as:  Burning.  Shock-like.  Stinging.  Hot or cold.  Itching. DIAGNOSIS  No single test can diagnose neuropathic pain. Your health care provider will do a physical exam and ask you about your pain. You may use a pain scale to describe how bad your pain is. You may also have tests to see if you have a high sensitivity to pain and to help find the cause and location of any sensory nerve damage. These tests may include:  Imaging studies, such as:  X-rays.  CT scan.  MRI.  Nerve conduction studies to test how well nerve signals travel through your sensory nerves (electrodiagnostic testing).  Stimulating your sensory nerves through electrodes on your  skin and measuring the response in your spinal cord and brain (somatosensory evoked potentials). TREATMENT  Treatment for neuropathic pain may change over time. You may need to try different treatment options or a combination of treatments. Some options include:  Over-the-counter pain relievers.  Prescription medicines. Some medicines used to treat other conditions may also help neuropathic pain. These include medicines to:  Control seizures (anticonvulsants).  Relieve depression (antidepressants).  Prescription-strength pain relievers (narcotics). These are usually used when other pain relievers do not help.  Transcutaneous nerve stimulation (TENS). This uses electrical currents to block painful nerve signals. The treatment is painless.  Topical and local anesthetics. These are medicines that numb the nerves. They can be injected as a nerve block or applied to the skin.  Alternative treatments, such as:  Acupuncture.  Meditation.  Massage.  Physical therapy.  Pain management programs.  Counseling. HOME CARE INSTRUCTIONS  Learn as much as you can about your condition.  Take medicines only as directed by your health care provider.  Work closely with all your health care providers to find what works best for you.  Have a good support system at home.  Consider joining a chronic pain support group. SEEK MEDICAL CARE IF:  Your pain treatments are not helping.  You are having side effects from your medicines.  You are struggling with fatigue, mood changes, depression, or anxiety.   This information is not intended to replace advice given to you by your health care provider. Make sure you discuss any questions you have with your health care provider.   Document Released: 10/24/2003 Document Revised: 02/16/2014 Document Reviewed: 07/06/2013 Elsevier Interactive Patient Education [employer].

## 2015-02-15 NOTE — ED Provider Notes (Signed)
CSN: [id number]     Arrival date & time 02/15/15  1306 History   First MD Initiated Contact with Patient 02/15/15 1338     No chief complaint on file.  (Consider location/radiation/quality/duration/timing/severity/associated sxs/prior Treatment) HPI Comments: 30 year old female complaining of mid back pain for 2 days. She states is been gradually getting worse. It is located over the center line of the mid thoracic back and radiates strictly to the left. She has no pain on the right side. It is much worse with touch, lying on her back and even the bra makes the pain worse. She describes the pain is fiery, burning and stinging. Occasionally she will have numbness surrounding the area of pain.  The patient has a history of shingles that have occurred 2 or more times to the right side of the face and frontoparietal scalp. History of chickenpox.   Past Medical History  Diagnosis Date  . Migraines   . Fibromyalgia   . Raynaud's disease   . Allergy   . Ureterolithiasis    Past Surgical History  Procedure Laterality Date  . Tubal ligation    . Laparoscopy    . Tubal ligation    . Breast biopsy    . Tonsillectomy    . Appendectomy    . Dilation and curettage of uterus    . Kidney stent     Family History  Problem Relation Age of Onset  . Hypertension Mother   . Arthritis Mother   . Breast cancer Mother 71  . Uterine cancer Mother 8  . Breast cancer Maternal Grandmother     dx in her 16s  . Throat cancer Maternal Grandfather     smoker; dx in 23s  . Melanoma Paternal Grandfather   . Melanoma Maternal Aunt     dx in her 66s   Social History  Substance Use Topics  . Smoking status: Never Smoker   . Smokeless tobacco: None  . Alcohol Use: No   OB History    Gravida Para Term Preterm AB TAB SAB Ectopic Multiple Living   3 2 2  1  1   3      Review of Systems  Constitutional: Negative for fever, activity change and fatigue.  HENT: Negative.   Respiratory: Negative.   Negative for cough and shortness of breath.   Cardiovascular: Negative for chest pain, palpitations and leg swelling.  Gastrointestinal: Negative.   Genitourinary: Negative.   Musculoskeletal: Negative.   Skin: Negative for color change and rash.  Neurological: Negative for dizziness, facial asymmetry and headaches.  Psychiatric/Behavioral: Negative.     Allergies  Terbutaline sulfate; Vitamin a; and Latex  Home Medications   Prior to Admission medications   Medication Sig Start Date End Date Taking? Authorizing Provider  ibuprofen (ADVIL,MOTRIN) 200 MG tablet Take 200 mg by mouth every 6 (six) hours as needed for headache, mild pain or moderate pain.    Historical Provider, MD  oxyCODONE-acetaminophen (PERCOCET/ROXICET) 5-325 MG tablet Take 1 tablet by mouth every 4 (four) hours as needed for severe pain. 02/15/15   Janne Napoleon, NP  valACYclovir (VALTREX) 1000 MG tablet Take 1 tablet (1,000 mg total) by mouth 3 (three) times daily. 02/15/15   Janne Napoleon, NP   Meds Ordered and Administered this Visit  Medications - No data to display  BP 114/68 mmHg  Pulse 71  Temp(Src) 97 F (36.1 C) (Oral)  Resp 18  SpO2 100%  LMP 02/12/2015 No data found.   Physical Exam  Constitutional: She is oriented to person, place, and time. She appears well-developed and well-nourished. No distress.  Eyes: EOM are normal.  Neck: Normal range of motion. Neck supple.  Cardiovascular: Normal rate.   Pulmonary/Chest: Effort normal. No respiratory distress.  Abdominal: Soft. There is no tenderness.  Musculoskeletal: Normal range of motion. She exhibits no edema.  Neurological: She is oriented to person, place, and time. No cranial nerve deficit. She exhibits normal muscle tone.  Skin: Skin is warm and dry. No rash noted.  To the mid back and a narrow band running laterally left and anteriorly to approximately the anterior axillary line along the T5, T6 and T7 dermatomes. There are a few small red papules  which are tender but no other evidence of a typical rash for zoster. These dermatomes are tender to light touch. And movements of the torso.  Psychiatric: She has a normal mood and affect.  Nursing note and vitals reviewed.   ED Course  Procedures (including critical care time)  Labs Review Labs Reviewed - No data to display  Imaging Review No results found.   Visual Acuity Review  Right Eye Distance:   Left Eye Distance:   Bilateral Distance:    Right Eye Near:   Left Eye Near:    Bilateral Near:         MDM   1. Shingles    Percocet 5 mg #15 Recommend trying the fourth percent Xylocaine patches obtained OTC and applies directed over the most sensitive areas Valtrex 1 g 3 times a day for 7 days Follow with primary care doctor as needed.    Janne Napoleon, NP 02/15/15 1434

## 2015-04-12 LAB — CBC AND DIFFERENTIAL
HCT: 38 % (ref 36–46)
Hemoglobin: 12.5 g/dL (ref 12.0–16.0)
PLATELETS: 235 10*3/uL (ref 150–399)
WBC: 4.3 10^3/mL

## 2015-07-01 ENCOUNTER — Emergency Department (HOSPITAL_BASED_OUTPATIENT_CLINIC_OR_DEPARTMENT_OTHER): Payer: Commercial Managed Care - HMO

## 2015-07-01 ENCOUNTER — Emergency Department (HOSPITAL_BASED_OUTPATIENT_CLINIC_OR_DEPARTMENT_OTHER)
Admission: EM | Admit: 2015-07-01 | Discharge: 2015-07-01 | Disposition: A | Discharge status: Home / Self Care | Payer: Commercial Managed Care - HMO | Attending: Emergency Medicine | Admitting: Emergency Medicine

## 2015-07-01 ENCOUNTER — Encounter (HOSPITAL_BASED_OUTPATIENT_CLINIC_OR_DEPARTMENT_OTHER): Payer: Self-pay | Admitting: *Deleted

## 2015-07-01 DIAGNOSIS — R05 Cough: Secondary | ICD-10-CM | POA: Diagnosis present

## 2015-07-01 DIAGNOSIS — J029 Acute pharyngitis, unspecified: Secondary | ICD-10-CM | POA: Insufficient documentation

## 2015-07-01 DIAGNOSIS — R059 Cough, unspecified: Secondary | ICD-10-CM

## 2015-07-01 LAB — RAPID STREP SCREEN (MED CTR MEBANE ONLY): STREPTOCOCCUS, GROUP A SCREEN (DIRECT): NEGATIVE

## 2015-07-01 MED ORDER — HYDROCOD POLST-CPM POLST ER 10-8 MG/5ML PO SUER: 5.0000 mL | Freq: Two times a day (BID) | ORAL | Status: DC | PRN

## 2015-07-01 MED ORDER — HYDROCOD POLST-CPM POLST ER 10-8 MG/5ML PO SUER
5.0000 mL | Freq: Once | ORAL | Status: AC
Start: 2015-07-01 — End: 2015-07-01
  Administered 2015-07-01: 5 mL via ORAL
  Filled 2015-07-01: qty 5

## 2015-07-01 MED FILL — HYDROCODONE-CHLORPHENIRAM S: 10-8 | 12 days supply | Qty: 120 | Fill #0

## 2015-07-01 NOTE — ED Provider Notes (Signed)
CSN: [id number]     Arrival date & time 07/01/15  1028 History   First MD Initiated Contact with Patient 07/01/15 1038     Chief Complaint  Patient presents with  . Cough     (Consider location/radiation/quality/duration/timing/severity/associated sxs/prior Treatment) Patient is a 30 y.o. female presenting with cough. The history is provided by the patient and medical records.  Cough Associated symptoms: sore throat     30 year old female with history of migraine headaches, fibromyalgia, Raynaud syndrome, seasonal allergies, presenting to the ED for cough. Patient reports she has had a persistent, productive cough since May 5. She states she has seen her primary care physician for this multiple times and has been diagnosed with sinus infection, URI, flu, and strep throat. She states she was also started on medication for seasonal allergies. She states she went to the beach over the weekend and was seen in the ED there. She had a chest x-ray performed which was negative. She states this morning upon waking she had increased sinus pressure, sore throat, and has been constantly coughing. She denies any chest pain or shortness of breath.  No fever or chills this morning, states she has had a low-grade fever over the past few days.  States she has completed 2 courses of antibiotics, course of prednisone, and has been prescribed an albuterol inhaler but none have provided her any significant relief.  Past Medical History  Diagnosis Date  . Migraines   . Fibromyalgia   . Raynaud's disease   . Allergy   . Ureterolithiasis    Past Surgical History  Procedure Laterality Date  . Tubal ligation    . Laparoscopy    . Tubal ligation    . Breast biopsy    . Tonsillectomy    . Appendectomy    . Dilation and curettage of uterus    . Kidney stent     Family History  Problem Relation Age of Onset  . Hypertension Mother   . Arthritis Mother   . Breast cancer Mother 38  . Uterine cancer Mother 70   . Breast cancer Maternal Grandmother     dx in her 55s  . Throat cancer Maternal Grandfather     smoker; dx in 44s  . Melanoma Paternal Grandfather   . Melanoma Maternal Aunt     dx in her 22s   Social History  Substance Use Topics  . Smoking status: Never Smoker   . Smokeless tobacco: None  . Alcohol Use: No   OB History    Gravida Para Term Preterm AB TAB SAB Ectopic Multiple Living   3 2 2  1  1   3      Review of Systems  HENT: Positive for sinus pressure and sore throat.   Respiratory: Positive for cough.   All other systems reviewed and are negative.     Allergies  Terbutaline sulfate; Vitamin a; and Latex  Home Medications   Prior to Admission medications   Not on File   BP 121/87 mmHg  Pulse 75  Temp(Src) 98.4 F (36.9 C) (Oral)  Resp 18  Ht 5\' 7"  (1.702 m)  Wt 59.875 kg  BMI 20.67 kg/m2  SpO2 100%  LMP 06/17/2015   Physical Exam  Constitutional: She is oriented to person, place, and time. She appears well-developed and well-nourished.  HENT:  Head: Normocephalic and atraumatic.  Right Ear: Tympanic membrane and ear canal normal.  Left Ear: Tympanic membrane and ear canal normal.  Nose: Nose  normal.  Mouth/Throat: Mucous membranes are normal. Posterior oropharyngeal erythema present.  Posterior oropharynx erythematous with PND noted, no tonsillar edema or exudates, handling secretions well, normal phonation without stridor  Eyes: Conjunctivae and EOM are normal. Pupils are equal, round, and reactive to light.  Neck: Normal range of motion.  Cardiovascular: Normal rate, regular rhythm and normal heart sounds.   Pulmonary/Chest: Effort normal and breath sounds normal. She has no wheezes. She has no rhonchi.  Dry cough noted throughout exam, lungs overall clear  Abdominal: Soft. Bowel sounds are normal.  Musculoskeletal: Normal range of motion.  Neurological: She is alert and oriented to person, place, and time.  Skin: Skin is warm and dry.   Psychiatric: She has a normal mood and affect.  Nursing note and vitals reviewed.   ED Course  Procedures (including critical care time) Labs Review Labs Reviewed  RAPID STREP SCREEN (NOT AT [facility])  CULTURE, GROUP A STREP [facility])    Imaging Review Dg Chest 2 View  07/01/2015  CLINICAL DATA:  Cough 3 weeks EXAM: CHEST  2 VIEW COMPARISON:  10/23/2014 FINDINGS: The heart size and mediastinal contours are within normal limits. Both lungs are clear. The visualized skeletal structures are unremarkable. IMPRESSION: No active cardiopulmonary disease. Electronically Signed   By: Franchot Gallo M.D.   On: 07/01/2015 11:40   I have personally reviewed and evaluated these images and lab results as part of my medical decision-making.   EKG Interpretation None      MDM   Final diagnoses:  Cough  Sore throat   30 year old female here with persistent cough for the past 3 weeks. She is afebrile and nontoxic in appearance. Throughout exam she has a continuous dry cough but is in no acute respiratory distress. She does have some posterior oropharyngeal erythema with postnasal drip, no tonsillar edema or exudates. Rapid strep was sent and is negative, culture pending. Chest x-ray is clear. Patient was treated with Tussionex here in emergency department, her cough is now controlled and she is able to speak in fluid sentences without coughing.  Suspect this is a prolonged viral process.  Will d/c home with tussionex.  Follow-up with PCP.  Discussed plan with patient, he/she acknowledged understanding and agreed with plan of care.  Return precautions given for new or worsening symptoms.  Larene Pickett, PA-C 07/01/15 [place], MD 07/01/15 [phone]

## 2015-07-01 NOTE — ED Notes (Signed)
Patient transported to X-ray via wheelchair per tech. 

## 2015-07-01 NOTE — Discharge Instructions (Signed)
Take the prescribed medication as directed.  Use caution, it can make you sleepy/drowsy. Follow-up with your primary care doctor. Return to the ED for new or worsening symptoms.

## 2015-07-01 NOTE — ED Notes (Signed)
Pt ambulatory to restroom no difficulty.  

## 2015-07-01 NOTE — ED Notes (Signed)
Pt reports cough x may 5th. Pt reports she has seen her pcp mulitple times and has also been to another er with dx of sinus infection, uri, and allergies. Pt states "My doctor told me to find someone else to see me about this, that's why I'm here."

## 2015-07-01 NOTE — ED Notes (Signed)
PA at bedside to discuss results of testing.

## 2015-07-01 NOTE — ED Notes (Signed)
PA at bedside.

## 2015-07-03 LAB — CULTURE, GROUP A STREP (THRC)

## 2015-09-11 DIAGNOSIS — S6991XA Unspecified injury of right wrist, hand and finger(s), initial encounter: Secondary | ICD-10-CM | POA: Insufficient documentation

## 2015-09-13 ENCOUNTER — Encounter (HOSPITAL_BASED_OUTPATIENT_CLINIC_OR_DEPARTMENT_OTHER): Payer: Self-pay

## 2015-09-13 ENCOUNTER — Emergency Department (HOSPITAL_BASED_OUTPATIENT_CLINIC_OR_DEPARTMENT_OTHER): Payer: Commercial Managed Care - HMO

## 2015-09-13 ENCOUNTER — Emergency Department (HOSPITAL_BASED_OUTPATIENT_CLINIC_OR_DEPARTMENT_OTHER)
Admission: EM | Admit: 2015-09-13 | Discharge: 2015-09-13 | Disposition: A | Discharge status: Home / Self Care | Payer: Commercial Managed Care - HMO | Attending: Emergency Medicine | Admitting: Emergency Medicine

## 2015-09-13 DIAGNOSIS — Y939 Activity, unspecified: Secondary | ICD-10-CM | POA: Insufficient documentation

## 2015-09-13 DIAGNOSIS — S43401A Unspecified sprain of right shoulder joint, initial encounter: Secondary | ICD-10-CM | POA: Insufficient documentation

## 2015-09-13 DIAGNOSIS — Y999 Unspecified external cause status: Secondary | ICD-10-CM | POA: Insufficient documentation

## 2015-09-13 DIAGNOSIS — Y92129 Unspecified place in nursing home as the place of occurrence of the external cause: Secondary | ICD-10-CM | POA: Insufficient documentation

## 2015-09-13 DIAGNOSIS — S66911A Strain of unspecified muscle, fascia and tendon at wrist and hand level, right hand, initial encounter: Secondary | ICD-10-CM

## 2015-09-13 DIAGNOSIS — R079 Chest pain, unspecified: Secondary | ICD-10-CM | POA: Insufficient documentation

## 2015-09-13 DIAGNOSIS — X501XXA Overexertion from prolonged static or awkward postures, initial encounter: Secondary | ICD-10-CM | POA: Insufficient documentation

## 2015-09-13 MED ORDER — KETOROLAC TROMETHAMINE 60 MG/2ML IM SOLN
60.0000 mg | Freq: Once | INTRAMUSCULAR | Status: AC
  Administered 2015-09-13: 60 mg via INTRAMUSCULAR
  Filled 2015-09-13: qty 2

## 2015-09-13 NOTE — Discharge Instructions (Signed)
Continue ice/heat, ibuprofen/tylenol, and bracing as needed. Continue range of motion exercises.   Return for worsening symptoms.
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
## 2015-09-13 NOTE — ED Notes (Signed)
Patient states that her work sent her to [place] yesterday and she had xrays that were negative. Patient states that she is uncomfortable with trusting these results after the staff seemed incompetent with equipment and reading results. Patient states she would like to have imaging repeated.
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
## 2015-09-18 IMAGING — CR DG SHOULDER 2+V*R*
3 series · 3 of 3 positions shown · non-contrast
Comparison: Two-view chest 12/10/2012.

CLINICAL DATA: Fall.  Decreased range of motion.

EXAM:
RIGHT SHOULDER - 2+ VIEW

[w shoulder ap internal righ]
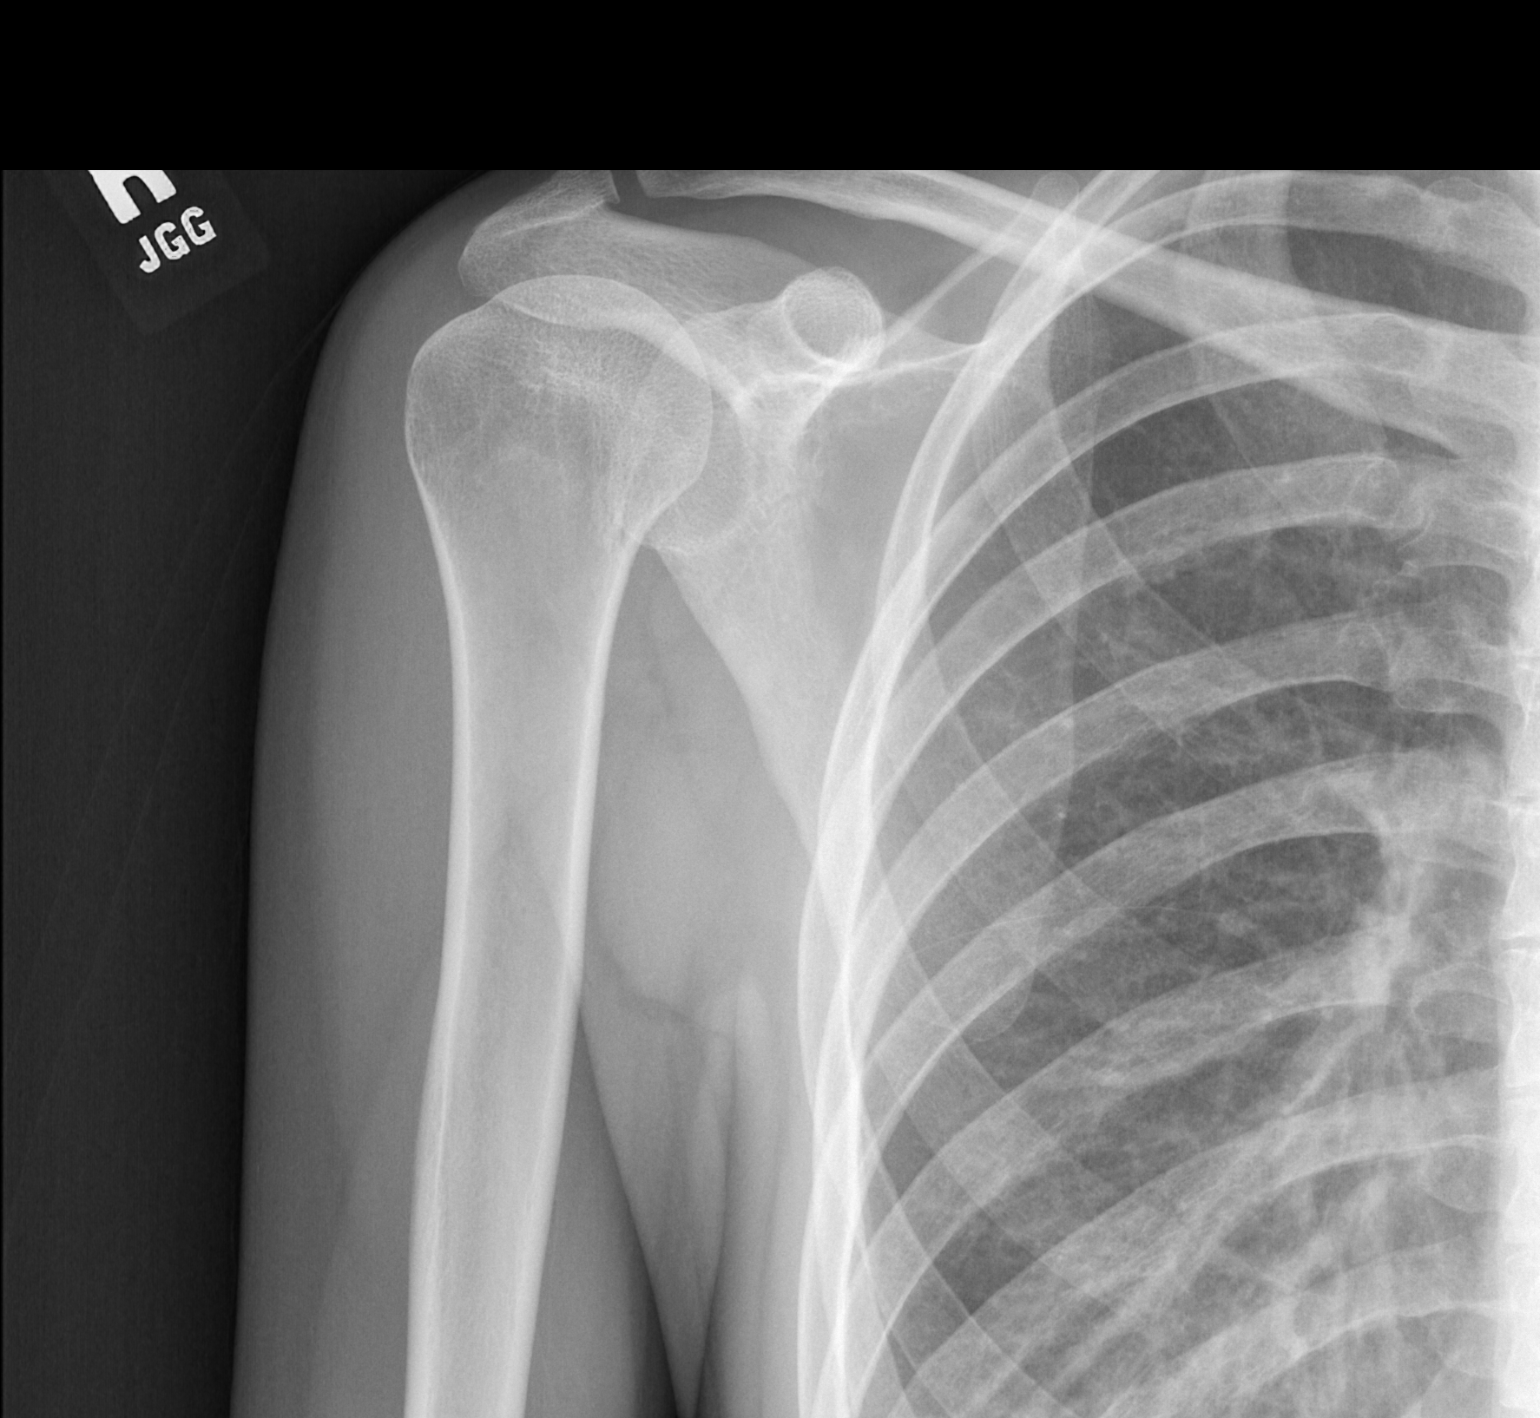

[w shoulder y view right]
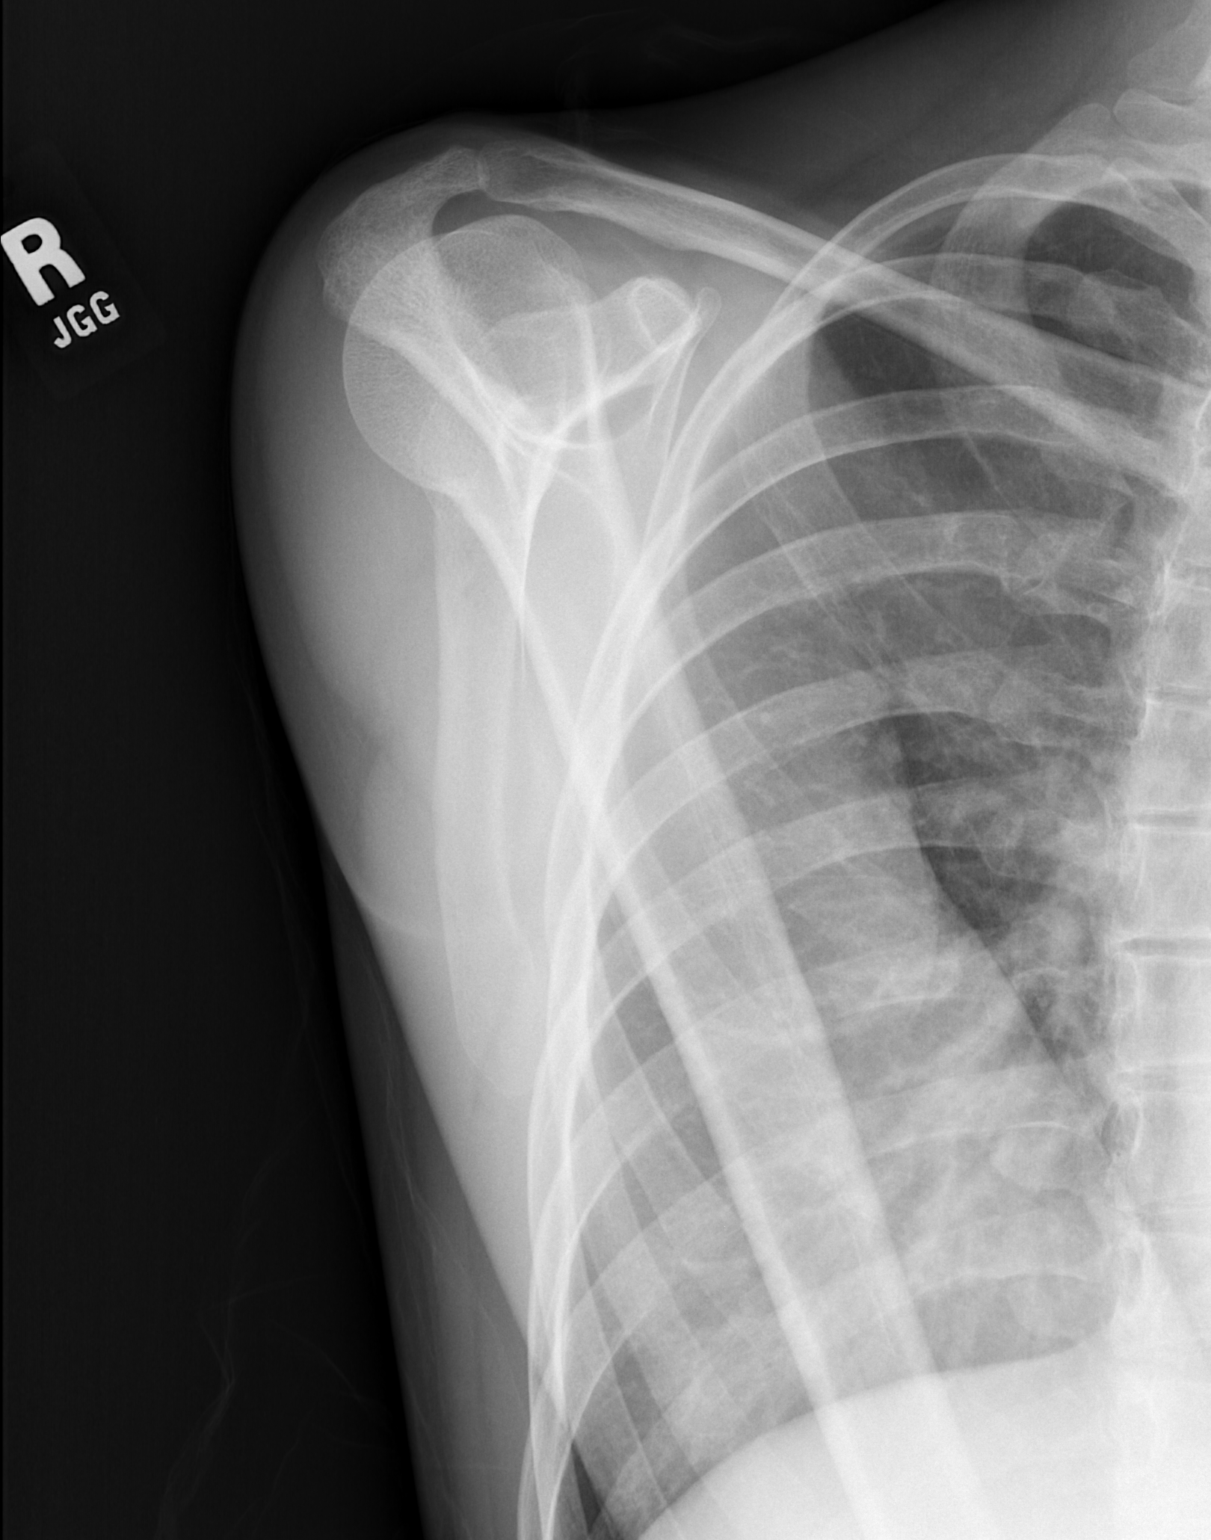

[x shoulder axillary right *]
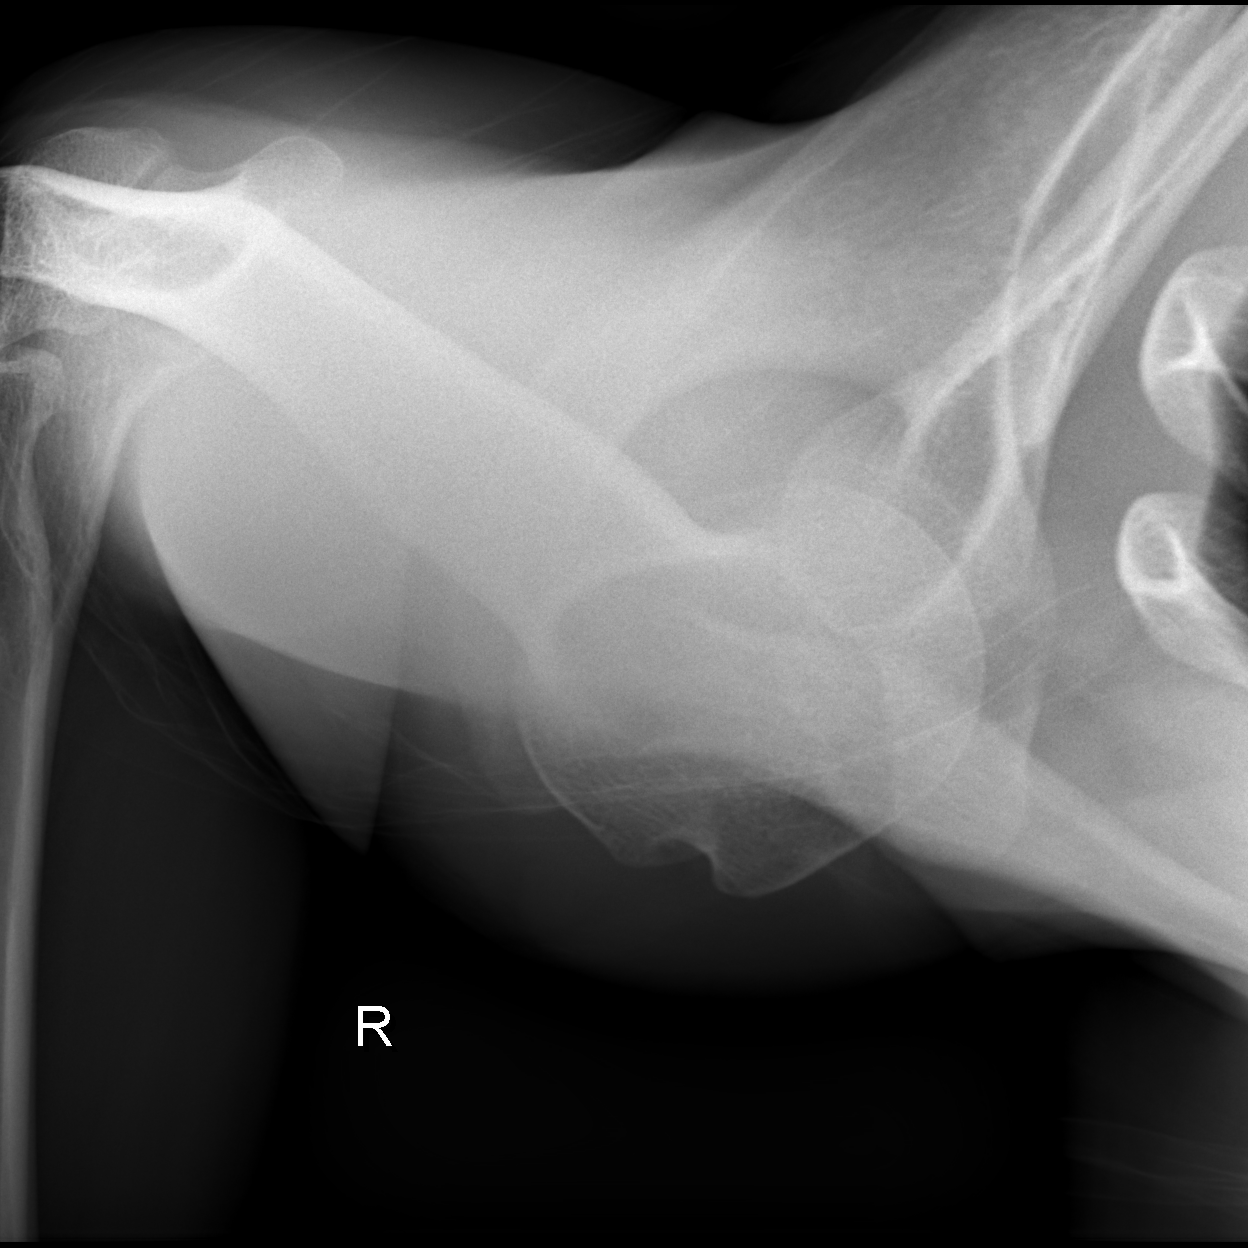

[3 of 3 positions shown; findings below may reference images not displayed]

FINDINGS: The right shoulder is located. The clavicle is intact. No acute bone
or soft tissue abnormality is evident.
IMPRESSION: Negative right shoulder radiographs.

## 2015-10-08 ENCOUNTER — Ambulatory Visit (HOSPITAL_COMMUNITY)
Admission: EM | Admit: 2015-10-08 | Discharge: 2015-10-08 | Disposition: A | Discharge status: Home / Self Care | Payer: Commercial Managed Care - HMO

## 2015-10-08 ENCOUNTER — Encounter (HOSPITAL_COMMUNITY): Payer: Self-pay | Admitting: *Deleted

## 2015-10-08 DIAGNOSIS — B009 Herpesviral infection, unspecified: Secondary | ICD-10-CM

## 2015-10-08 MED ORDER — VALACYCLOVIR HCL 1 G PO TABS: 1000.0000 mg | ORAL_TABLET | Freq: Three times a day (TID) | ORAL | 0 refills | Status: DC

## 2015-10-08 NOTE — ED Provider Notes (Signed)
[place] [facility]    CSN: [id number] Arrival date & time: 10/08/15  1524  First Provider Contact:  First MD Initiated Contact with Patient 10/08/15 1549        History   Chief Complaint Chief Complaint  Patient presents with  . Mouth Lesions    HPI NAGMA DICUS is a 30 y.o. female.    Mouth Lesions  Location:  Lower lip Lower lip location:  R inner Quality:  Blistered Onset quality:  Gradual Severity:  Moderate Duration:  2 days Progression:  Worsening Chronicity:  New Relieved by:  None tried Worsened by:  Nothing Ineffective treatments:  None tried Associated symptoms: no fever   Associated symptoms comment:  Assoc facial pain    Past Medical History:  Diagnosis Date  . Allergy   . Fibromyalgia   . Migraines   . Raynaud's disease   . Ureterolithiasis     Patient Active Problem List   Diagnosis Date Noted  . 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 Fibromyalgia 05/16/2011  . Abdominal pain, acute, right upper quadrant 09/01/2010    Past Surgical History:  Procedure Laterality Date  . APPENDECTOMY    . BREAST BIOPSY    . DILATION AND CURETTAGE OF UTERUS    . kidney stent    . LAPAROSCOPY    . TONSILLECTOMY    . TUBAL LIGATION    . TUBAL LIGATION      OB History    Gravida Para Term Preterm AB Living   3 2 2   1 3    SAB TAB Ectopic Multiple Live Births   1               Home Medications    Prior to Admission medications   Medication Sig Start Date End Date Taking? Authorizing Provider  pregabalin (LYRICA) 25 MG capsule Take 25 mg by mouth 3 (three) times daily.    Historical Provider, MD    Family History Family History  Problem Relation Age of Onset  . Hypertension Mother   . Arthritis Mother   . Breast cancer Mother 72  . Uterine cancer Mother 27  . Breast cancer Maternal Grandmother     dx in her 60s  . Throat cancer Maternal Grandfather     smoker; dx in 12s  . Melanoma Paternal Grandfather   . Melanoma  Maternal Aunt     dx in her 38s    Social History Social History  Substance Use Topics  . Smoking status: Never Smoker  . Smokeless tobacco: Never Used  . Alcohol use No     Allergies   Terbutaline sulfate; Vitamin a; and Latex   Review of Systems Review of Systems  Constitutional: Negative for fever.  HENT: Positive for mouth sores.   Hematological: Negative for adenopathy.  All other systems reviewed and are negative.    Physical Exam Triage Vital Signs ED Triage Vitals [10/08/15 1552]  Enc Vitals Group     BP 117/80     Pulse Rate 82     Resp 18     Temp 97.8 F (36.6 C)     Temp Source Oral     SpO2 100 %     Weight      Height      Head Circumference      Peak Flow      Pain Score      Pain Loc      Pain Edu?  Excl. in [place]?    No data found.   Updated Vital Signs BP 117/80 (BP Location: Right Arm)   Pulse 82   Temp 97.8 F (36.6 C) (Oral)   Resp 18   LMP 08/29/2015   SpO2 100%   Visual Acuity Right Eye Distance:   Left Eye Distance:   Bilateral Distance:    Right Eye Near:   Left Eye Near:    Bilateral Near:     Physical Exam  Constitutional: She appears well-developed and well-nourished. No distress.  HENT:  Right Ear: External ear normal.  Left Ear: External ear normal.  Lower lip ulcerative lesion, right  Facial sensitivity.no Photophobia.  Eyes: Conjunctivae are normal. Pupils are equal, round, and reactive to light.  Neck: Normal range of motion.  Lymphadenopathy:    She has no cervical adenopathy.  Nursing note and vitals reviewed.    UC Treatments / Results  Labs (all labs ordered are listed, but only abnormal results are displayed) Labs Reviewed - No data to display  EKG  EKG Interpretation None       Radiology No results found.  Procedures Procedures (including critical care time)  Medications Ordered in UC Medications - No data to display   Initial Impression / Assessment and Plan / UC Course  I  have reviewed the triage vital signs and the nursing notes.  Pertinent labs & imaging results that were available during my care of the patient were reviewed by me and considered in my medical decision making (see chart for details).  Clinical Course      Final Clinical Impressions(s) / UC Diagnoses   Final diagnoses:  None    New Prescriptions New Prescriptions   No medications on file     Billy Fischer, MD 10/08/15 [phone]
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
## 2015-10-09 ENCOUNTER — Ambulatory Visit: Payer: Commercial Managed Care - HMO | Attending: Orthopedic Surgery | Admitting: Physical Therapy

## 2015-10-09 DIAGNOSIS — M25621 Stiffness of right elbow, not elsewhere classified: Secondary | ICD-10-CM | POA: Diagnosis present

## 2015-10-09 DIAGNOSIS — M25611 Stiffness of right shoulder, not elsewhere classified: Secondary | ICD-10-CM | POA: Diagnosis present

## 2015-10-09 DIAGNOSIS — M6281 Muscle weakness (generalized): Secondary | ICD-10-CM

## 2015-10-09 DIAGNOSIS — M25512 Pain in left shoulder: Secondary | ICD-10-CM
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
## 2015-10-10 LAB — MEASLES/MUMPS/RUBELLA IMMUNITY: RUBELLA: 2.02

## 2015-10-16 ENCOUNTER — Ambulatory Visit: Payer: Commercial Managed Care - HMO | Attending: Orthopedic Surgery | Admitting: Physical Therapy

## 2015-10-16 DIAGNOSIS — M25512 Pain in left shoulder: Secondary | ICD-10-CM | POA: Diagnosis not present

## 2015-10-16 DIAGNOSIS — M25621 Stiffness of right elbow, not elsewhere classified: Secondary | ICD-10-CM | POA: Diagnosis present

## 2015-10-16 DIAGNOSIS — M6281 Muscle weakness (generalized): Secondary | ICD-10-CM | POA: Insufficient documentation

## 2015-10-16 DIAGNOSIS — M25611 Stiffness of right shoulder, not elsewhere classified: Secondary | ICD-10-CM | POA: Diagnosis present

## 2015-10-16 NOTE — Therapy (Signed)
[facility] [address] [place], [place], [address] Phone: [phone]   Fax:  [phone]  Physical Therapy Treatment  Patient Details  Name: Cindy Turner MRN: [id number] Date of Birth: 04/19/1985 Referring Provider: Jonathon Mundy, PA  Encounter Date: 10/16/2015      PT End of Session - 10/16/15 1049    Visit Number 2   Number of Visits 16   Date for PT Re-Evaluation 12/04/15   PT Start Time 0932   PT Stop Time 1030   PT Time Calculation (min) 58 min   Activity Tolerance Patient tolerated treatment well;Patient limited by pain   Behavior During Therapy WFL for tasks assessed/performed      Past Medical History:  Diagnosis Date  . Allergy   . Fibromyalgia   . Migraines   . Raynaud's disease   . Ureterolithiasis     Past Surgical History:  Procedure Laterality Date  . APPENDECTOMY    . BREAST BIOPSY    . DILATION AND CURETTAGE OF UTERUS    . kidney stent    . LAPAROSCOPY    . TONSILLECTOMY    . TUBAL LIGATION    . TUBAL LIGATION      There were no vitals filed for this visit.      Subjective Assessment - 10/16/15 0935    Subjective I havent been sleeping.  It aches really bad. I am done with sling.     Currently in Pain? Yes   Pain Score 6    Pain Location Shoulder   Pain Orientation Right;Proximal   Pain Descriptors / Indicators Aching   Pain Type Acute pain;Chronic pain   Pain Onset More than a month ago   Pain Frequency Constant            [facility] PT Assessment - 10/16/15 0941      Strength   Overall Strength Other (comment)  Lt  16, 14, 16 kg  Rt. 12, 10 and 14 kg                      [facility] Adult PT Treatment/Exercise - 10/16/15 0938      Shoulder Exercises: Seated   Flexion AAROM;Left;10 reps   Flexion Weight (lbs) UE Ranger   Abduction AAROM;Right;10 reps   ABduction Weight (lbs) UE ranger scaption    Other Seated Exercises semi circles and adduction,  abduction (horiz)      Shoulder Exercises: Pulleys   Flexion 3 minutes   ABduction 1 minute     Cryotherapy   Number Minutes Cryotherapy 15 Minutes   Cryotherapy Location Shoulder   Type of Cryotherapy Ice pack     Electrical Stimulation   Electrical Stimulation Location Rt. UE    Electrical Stimulation Action IFC   Electrical Stimulation Parameters 14   Electrical Stimulation Goals Pain     Manual Therapy   Manual Therapy Soft tissue mobilization;Myofascial release;Passive ROM   Soft tissue mobilization biceps, deltoid and post cuff to tolerance , min work on forearm    Myofascial Release Rt. arm    Passive ROM all planes to tolerance                 PT Education - 10/16/15 1048    Education provided Yes   Education Details improvements, AAROM    Person(s) Educated Patient   Methods Explanation   Comprehension Verbalized understanding;Returned demonstration          PT Short Term Goals -   10/16/15 1247      PT SHORT TERM GOAL #1   Title Pt will be I with initial ROM and pendulum ex    Status Partially Met     PT SHORT TERM GOAL #2   Title Pt will be able to drive and wean out of sling 90% of the time.    Status Partially Met     PT SHORT TERM GOAL #3   Title Pt will be able to perform grooming, ADLs with no more than mod pain (5/10)    Status On-going     PT SHORT TERM GOAL #4   Title Pt will use TENS and self care strategies /Ice for pain relief.    Status On-going           PT Long Term Goals - 10/16/15 1052      PT LONG TERM GOAL #1   Title Pt will be able to lift arm to 100 deg with min pain, difficulty to improve function in her home.    Status On-going     PT LONG TERM GOAL #2   Title Pt will be able to perform ADLs, groom with min difficulty.    Status On-going     PT LONG TERM GOAL #3   Title Pt will demo strength in Rt. to 4/5 fore return to work, lifting and care for children.    Status On-going     PT LONG TERM GOAL #4    Title Grip strength will improve on Rt to that of L UE (15 kg)    Time 8   Period Weeks   Status New     PT LONG TERM GOAL #5   Title FOTO score will improve to less than 35% limited.    Status On-going               Plan - 10/16/15 1049    Clinical Impression Statement Pt able to tolerate AROM and PROM better today (approx. 115 deg in supine).  No goals met as it is visit 2.     PT Next Visit Plan progress ROM, isometric, modalities and soft tissue    PT Home Exercise Plan table slides and pendulums    Consulted and Agree with Plan of Care Patient      Patient will benefit from skilled therapeutic intervention in order to improve the following deficits and impairments:     Visit Diagnosis: Pain in left shoulder  Muscle weakness (generalized)  Stiffness of right shoulder, not elsewhere classified  Stiffness of right elbow, not elsewhere classified     Problem List Patient Active Problem List   Diagnosis Date Noted  . 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 Fibromyalgia 05/16/2011  . Abdominal pain, acute, right upper quadrant 09/01/2010    PAA,JENNIFER 10/16/2015, 12:47 PM  [facility] [address] [place], [place], [address] Phone: [phone]   Fax:  [phone]  Name: Cindy Turner MRN: [id number] Date of Birth: 06/02/1985   Jennifer Paa, PT 10/16/15 12:47 PM Phone: [phone] Fax: [phone]  

## 2015-10-18 ENCOUNTER — Other Ambulatory Visit: Payer: Self-pay | Admitting: Orthopedic Surgery

## 2015-10-18 DIAGNOSIS — S43001D Unspecified subluxation of right shoulder joint, subsequent encounter: Secondary | ICD-10-CM

## 2015-10-18 DIAGNOSIS — S4991XD Unspecified injury of right shoulder and upper arm, subsequent encounter: Secondary | ICD-10-CM

## 2015-10-21 ENCOUNTER — Ambulatory Visit: Payer: Commercial Managed Care - HMO | Admitting: Physical Therapy

## 2015-10-23 ENCOUNTER — Ambulatory Visit: Payer: Commercial Managed Care - HMO | Admitting: Physical Therapy

## 2015-10-24 ENCOUNTER — Ambulatory Visit (HOSPITAL_COMMUNITY)
Admission: RE | Admit: 2015-10-24 | Discharge: 2015-10-24 | Disposition: A | Discharge status: Home / Self Care | Payer: Commercial Managed Care - HMO | Source: Ambulatory Visit | Attending: Orthopedic Surgery | Admitting: Orthopedic Surgery

## 2015-10-24 DIAGNOSIS — S4991XD Unspecified injury of right shoulder and upper arm, subsequent encounter: Secondary | ICD-10-CM | POA: Diagnosis not present

## 2015-10-24 DIAGNOSIS — S43001D Unspecified subluxation of right shoulder joint, subsequent encounter: Secondary | ICD-10-CM | POA: Insufficient documentation

## 2015-10-24 DIAGNOSIS — X58XXXD Exposure to other specified factors, subsequent encounter: Secondary | ICD-10-CM | POA: Diagnosis not present

## 2015-10-24 DIAGNOSIS — M7591 Shoulder lesion, unspecified, right shoulder: Secondary | ICD-10-CM | POA: Insufficient documentation

## 2015-10-28 ENCOUNTER — Ambulatory Visit: Payer: Commercial Managed Care - HMO | Admitting: Physical Therapy

## 2015-10-30 ENCOUNTER — Ambulatory Visit: Payer: Commercial Managed Care - HMO | Admitting: Physical Therapy

## 2015-10-30 DIAGNOSIS — M25621 Stiffness of right elbow, not elsewhere classified: Secondary | ICD-10-CM

## 2015-10-30 DIAGNOSIS — M25512 Pain in left shoulder: Secondary | ICD-10-CM | POA: Diagnosis not present

## 2015-10-30 DIAGNOSIS — M6281 Muscle weakness (generalized): Secondary | ICD-10-CM

## 2015-10-30 DIAGNOSIS — M25611 Stiffness of right shoulder, not elsewhere classified: Secondary | ICD-10-CM

## 2015-10-30 NOTE — Therapy (Signed)
[place], [place], [address] Phone: [phone]   Fax:  [phone]  Physical Therapy Treatment  Patient Details  Name: Cindy Turner MRN: [id number] Date of Birth: Mar 02, 1985 Referring Provider: Linus Salmons, PA  Encounter Date: 10/30/2015      PT End of Session - 10/30/15 0948    Visit Number 3   Number of Visits 16   Date for PT Re-Evaluation 12/04/15   PT Start Time 0932   PT Stop Time 1028   PT Time Calculation (min) 56 min   Activity Tolerance Patient tolerated treatment well   Behavior During Therapy [facility] for tasks assessed/performed      Past Medical History:  Diagnosis Date  . Allergy   . Fibromyalgia   . Migraines   . Raynaud's disease   . Ureterolithiasis     Past Surgical History:  Procedure Laterality Date  . APPENDECTOMY    . BREAST BIOPSY    . DILATION AND CURETTAGE OF UTERUS    . kidney stent    . LAPAROSCOPY    . TONSILLECTOMY    . TUBAL LIGATION    . TUBAL LIGATION      There were no vitals filed for this visit.      Subjective Assessment - 10/30/15 0936    Subjective Saw MD for MRI (cancelled appt here).  She has not had much pain since then.  None at rest.    Currently in Pain? Yes   Pain Score 3    Pain Location Shoulder   Pain Orientation Right;Proximal   Pain Descriptors / Indicators Aching   Pain Type Chronic pain   Pain Onset More than a month ago   Pain Frequency Intermittent   Aggravating Factors  pushing, pulling    Pain Relieving Factors rest, ice            [facility] PT Assessment - 10/30/15 1000      AROM   Right Shoulder Extension 39 Degrees   Right Shoulder Flexion 142 Degrees   Right Shoulder ABduction 128 Degrees   Right Shoulder Internal Rotation 75 Degrees  T4-T5   Right Shoulder External Rotation 65 Degrees  T2     Strength   Right Shoulder Flexion 4/5   Right Shoulder ABduction 4+/5   Right Shoulder Internal Rotation  4+/5   Right Shoulder External Rotation 4/5   Right Elbow Flexion 4/5   Right Elbow Extension 4/5                     [facility] Adult PT Treatment/Exercise - 10/30/15 0942      Shoulder Exercises: Supine   Horizontal ABduction Strengthening;Both;20 reps;Theraband   Theraband Level (Shoulder Horizontal ABduction) Level 2 (Red)   External Rotation Strengthening;Both;15 reps   Theraband Level (Shoulder External Rotation) Level 2 (Red)   Flexion Right;10 reps   Theraband Level (Shoulder Flexion) Level 1 (Yellow)     Shoulder Exercises: Seated   Extension Strengthening;Both;20 reps;Theraband   Theraband Level (Shoulder Extension) Level 1 (Yellow)   Extension Weight (lbs) standing    Row Strengthening;Both;20 reps   Theraband Level (Shoulder Row) Level 1 (Yellow)   Row Weight (lbs) standing      Shoulder Exercises: Standing   External Rotation Strengthening;Right;20 reps;Theraband   Theraband Level (Shoulder External Rotation) Level 1 (Yellow)   Internal Rotation Strengthening;Right;20 reps;Theraband   Theraband Level (Shoulder Internal Rotation) Level 1 (Yellow)     Shoulder Exercises: ROM/Strengthening  Other ROM/Strengthening Exercises UE Ranger flexion and scaption with weightshift      Cryotherapy   Number Minutes Cryotherapy 8 Minutes   Cryotherapy Location Shoulder   Type of Cryotherapy Ice pack                  PT Short Term Goals - 10/30/15 0948      PT SHORT TERM GOAL #1   Title Pt will be I with initial ROM and pendulum ex    Status Achieved     PT SHORT TERM GOAL #2   Title Pt will be able to drive and wean out of sling 90% of the time.    Status Achieved     PT SHORT TERM GOAL #3   Title Pt will be able to perform grooming, ADLs with no more than mod pain (5/10)    Status Achieved     PT SHORT TERM GOAL #4   Title Pt will use TENS and self care strategies /Ice for pain relief.    Status Achieved           PT Long Term Goals -  10/30/15 0949      PT LONG TERM GOAL #1   Title Pt will be able to lift arm to 100 deg with min pain, difficulty to improve function in her home.    Status On-going     PT LONG TERM GOAL #2   Title Pt will be able to perform ADLs, groom with min difficulty.    Baseline can't do her hair AND her children's hair.    Status Partially Met     PT LONG TERM GOAL #3   Title Pt will demo strength in Rt. to 4/5 fore return to work, lifting and care for children.    Status On-going     PT LONG TERM GOAL #4   Title Grip strength will improve on Rt to that of L UE (15 kg)    Status Unable to assess     PT LONG TERM GOAL #5   Title FOTO score will improve to less than 35% limited.    Status On-going               Plan - 10/30/15 0955    Clinical Impression Statement Pt has had a significant improvement in pain, function and mobility.  She has met all STGs.  Focusing now on strengthening.  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  Min pain increase with exercises.    PT Next Visit Plan progress ROM,, strength as tolerate    PT Home Exercise Plan wall slides and red/yellow band: row, ext, ER/IR      Patient will benefit from skilled therapeutic intervention in order to improve the following deficits and impairments:  Decreased range of motion, Increased fascial restricitons, Increased muscle spasms, Impaired UE functional use, Pain, Decreased activity tolerance, Impaired flexibility, Postural dysfunction, Decreased strength, Decreased mobility  Visit Diagnosis: Pain in left shoulder  Muscle weakness (generalized)  Stiffness of right shoulder, not elsewhere classified  Stiffness of right elbow, not elsewhere classified     Problem List Patient Active Problem List   Diagnosis Date Noted  . 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 Fibromyalgia 05/16/2011  . Abdominal pain, acute, right upper quadrant 09/01/2010     Jermario Kalmar 10/30/2015, 12:49 PM  [place], [place], [address] Phone: [phone]  Fax:  [phone]  Name: Cindy Turner MRN: [id number] Date of Birth: May 21, 1985  Raeford Razor, PT 10/30/15 12:49 PM Phone: [phone] Fax: [phone]

## 2015-11-04 ENCOUNTER — Ambulatory Visit: Payer: Commercial Managed Care - HMO | Admitting: Physical Therapy

## 2015-11-06 ENCOUNTER — Ambulatory Visit: Payer: Commercial Managed Care - HMO | Admitting: Physical Therapy

## 2015-11-06 DIAGNOSIS — M25621 Stiffness of right elbow, not elsewhere classified: Secondary | ICD-10-CM

## 2015-11-06 DIAGNOSIS — M25512 Pain in left shoulder: Secondary | ICD-10-CM | POA: Diagnosis not present

## 2015-11-06 DIAGNOSIS — M25611 Stiffness of right shoulder, not elsewhere classified: Secondary | ICD-10-CM

## 2015-11-06 DIAGNOSIS — M6281 Muscle weakness (generalized): Secondary | ICD-10-CM

## 2015-11-06 NOTE — Therapy (Addendum)
[place], [place], [address] Phone: [phone]   Fax:  [phone]  Physical Therapy Treatment/Discharge  Patient Details  Name: Cindy Turner MRN: [id number] Date of Birth: [birth year] Referring Provider: Linus Salmons, PA  Encounter Date: 11/06/2015      PT End of Session - 11/06/15 1012    Visit Number 4   Number of Visits 16   Date for PT Re-Evaluation 12/04/15   PT Start Time 0930   PT Stop Time 1010   PT Time Calculation (min) 40 min   Activity Tolerance Patient tolerated treatment well   Behavior During Therapy [facility] for tasks assessed/performed      Past Medical History:  Diagnosis Date  . Allergy   . Fibromyalgia   . Migraines   . Raynaud's disease   . Ureterolithiasis     Past Surgical History:  Procedure Laterality Date  . APPENDECTOMY    . BREAST BIOPSY    . DILATION AND CURETTAGE OF UTERUS    . kidney stent    . LAPAROSCOPY    . TONSILLECTOMY    . TUBAL LIGATION    . TUBAL LIGATION      There were no vitals filed for this visit.      Subjective Assessment - 11/06/15 0931    Subjective No pain at rest, did not take any meds today.  Had some soreness, achiness the other day but lifted to much groceries.     Currently in Pain? No/denies            [facility] PT Assessment - 11/06/15 1007      AROM   Right Shoulder Flexion 128 Degrees   Right Shoulder ABduction 130 Degrees                     [facility] Adult PT Treatment/Exercise - 11/06/15 0933      Shoulder Exercises: Supine   Protraction Strengthening;Both;15 reps;Weights   Protraction Weight (lbs) 2   Horizontal ABduction Strengthening;Both;20 reps;Theraband   Theraband Level (Shoulder Horizontal ABduction) Level 2 (Red)   External Rotation Strengthening;Both;15 reps   Theraband Level (Shoulder External Rotation) Level 2 (Red)   Internal Rotation Strengthening;Both;15 reps   Theraband Level  (Shoulder Internal Rotation) Level 2 (Red)   Flexion Right;15 reps;Weights;Other (comment)   Theraband Level (Shoulder Flexion) --  2 sets    Shoulder Flexion Weight (lbs) 2      Shoulder Exercises: Standing   Retraction Strengthening;Both;20 reps   Shoulder Elevation AAROM;Right     Shoulder Exercises: ROM/Strengthening   UBE (Upper Arm Bike) 6 min level 1 , 3 min forward, 3 min back      Ultrasound   Ultrasound Location L ant shoulder   Ultrasound Parameters 50%, 1.0 W/cm2  8 min    Ultrasound Goals Pain                  PT Short Term Goals - 11/06/15 1010      PT SHORT TERM GOAL #1   Title Pt will be I with initial ROM and pendulum ex    Status Achieved     PT SHORT TERM GOAL #2   Title Pt will be able to drive and wean out of sling 90% of the time.    Status Achieved     PT SHORT TERM GOAL #3   Title Pt will be able to perform grooming, ADLs with no more than mod pain (5/10)  Status Achieved     PT SHORT TERM GOAL #4   Title Pt will use TENS and self care strategies /Ice for pain relief.    Status Achieved           PT Long Term Goals - 11/06/15 1009      PT LONG TERM GOAL #1   Title Pt will be able to lift arm to 100 deg with min pain, difficulty to improve function in her home.    Status Partially Met     PT LONG TERM GOAL #2   Title Pt will be able to perform ADLs, groom with min difficulty.    Status Partially Met     PT LONG TERM GOAL #3   Title Pt will demo strength in Rt. to 4/5 fore return to work, lifting and care for children.    Status Partially Met     PT LONG TERM GOAL #4   Title Grip strength will improve on Rt to that of L UE (15 kg)    Status Unable to assess     PT LONG TERM GOAL #5   Title FOTO score will improve to less than 35% limited.    Status Unable to assess               Plan - 11/06/15 1008    Clinical Impression Statement Pain increased today to moderate during and after exercises.  Used ultrasound  to reduce inflammation.  Cont to focus on strength as tolerated.    PT Next Visit Plan progress ROM,, strength as tolerate , repeat US?   PT Home Exercise Plan wall slides and red/yellow band: row, ext, ER/IR   Consulted and Agree with Plan of Care Patient      Patient will benefit from skilled therapeutic intervention in order to improve the following deficits and impairments:  Decreased range of motion, Increased fascial restricitons, Increased muscle spasms, Impaired UE functional use, Pain, Decreased activity tolerance, Impaired flexibility, Postural dysfunction, Decreased strength, Decreased mobility  Visit Diagnosis: Pain in left shoulder  Muscle weakness (generalized)  Stiffness of right shoulder, not elsewhere classified  Stiffness of right elbow, not elsewhere classified     Problem List Patient Active Problem List   Diagnosis Date Noted  . 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 Fibromyalgia 05/16/2011  . Abdominal pain, acute, right upper quadrant 09/01/2010    Liliana Dang 11/06/2015, 10:48 AM  [place], [place], [address] Phone: [phone]   Fax:  [phone]  Name: Cindy Turner MRN: [id number] Date of Birth: [birth year]   Raeford Razor, PT 11/06/15 10:48 AM Phone: [phone] Fax: [phone]   PHYSICAL THERAPY DISCHARGE SUMMARY  Visits from Start of Care: 4  Current functional level related to goals / functional outcomes: See above for goals met   Remaining deficits: Unknown has not returned   Education / Equipment: HEP, posture and body mechanics, RICE Plan: Patient agrees to discharge.  Patient goals were not met. Patient is being discharged due to not returning since the last visit.  ?????    Raeford Razor, PT 01/06/16 10:31 AM Phone: [phone] Fax: [phone]

## 2015-11-11 ENCOUNTER — Ambulatory Visit: Payer: Commercial Managed Care - HMO | Admitting: Physical Therapy

## 2015-11-13 ENCOUNTER — Ambulatory Visit: Payer: Commercial Managed Care - HMO | Attending: Orthopedic Surgery | Admitting: Physical Therapy

## 2015-11-14 ENCOUNTER — Encounter (HOSPITAL_BASED_OUTPATIENT_CLINIC_OR_DEPARTMENT_OTHER): Payer: Self-pay | Admitting: *Deleted

## 2015-11-14 ENCOUNTER — Emergency Department (HOSPITAL_BASED_OUTPATIENT_CLINIC_OR_DEPARTMENT_OTHER): Payer: Commercial Managed Care - HMO

## 2015-11-14 ENCOUNTER — Emergency Department (HOSPITAL_BASED_OUTPATIENT_CLINIC_OR_DEPARTMENT_OTHER)
Admission: EM | Admit: 2015-11-14 | Discharge: 2015-11-15 | Disposition: A | Discharge status: Home / Self Care | Payer: Commercial Managed Care - HMO | Attending: Emergency Medicine | Admitting: Emergency Medicine

## 2015-11-14 DIAGNOSIS — R51 Headache: Secondary | ICD-10-CM | POA: Insufficient documentation

## 2015-11-14 DIAGNOSIS — Z79899 Other long term (current) drug therapy: Secondary | ICD-10-CM | POA: Insufficient documentation

## 2015-11-14 DIAGNOSIS — M792 Neuralgia and neuritis, unspecified: Secondary | ICD-10-CM | POA: Diagnosis not present

## 2015-11-14 DIAGNOSIS — M62830 Muscle spasm of back: Secondary | ICD-10-CM | POA: Insufficient documentation

## 2015-11-14 DIAGNOSIS — M545 Low back pain: Secondary | ICD-10-CM | POA: Diagnosis present

## 2015-11-14 LAB — CBC WITH DIFFERENTIAL/PLATELET
BASOS PCT: 1 %
Basophils Absolute: 0 10*3/uL (ref 0.0–0.1)
EOS ABS: 0.1 10*3/uL (ref 0.0–0.7)
Eosinophils Relative: 3 %
HCT: 38.4 % (ref 36.0–46.0)
Hemoglobin: 12.9 g/dL (ref 12.0–15.0)
Lymphocytes Relative: 37 %
Lymphs Abs: 1.8 10*3/uL (ref 0.7–4.0)
MCH: 25.7 pg — ABNORMAL LOW (ref 26.0–34.0)
MCHC: 33.6 g/dL (ref 30.0–36.0)
MCV: 76.5 fL — ABNORMAL LOW (ref 78.0–100.0)
MONO ABS: 0.4 10*3/uL (ref 0.1–1.0)
MONOS PCT: 9 %
Neutro Abs: 2.4 10*3/uL (ref 1.7–7.7)
Neutrophils Relative %: 50 %
Platelets: 204 10*3/uL (ref 150–400)
RBC: 5.02 MIL/uL (ref 3.87–5.11)
RDW: 15.1 % (ref 11.5–15.5)
WBC: 4.8 10*3/uL (ref 4.0–10.5)

## 2015-11-14 MED ORDER — GABAPENTIN 100 MG PO CAPS
100.0000 mg | ORAL_CAPSULE | Freq: Once | ORAL | Status: AC
  Administered 2015-11-14: 100 mg via ORAL
  Filled 2015-11-14: qty 1

## 2015-11-14 NOTE — ED Provider Notes (Addendum)
[place] DEPT [facility] Provider Note: Georgena Spurling, MD, FACEP  CSN: [id number] MRN: [id number] ARRIVAL: 11/14/15 at 2231  By signing my name below, I, Emmanuella Mensah, attest that this documentation has been prepared under the direction and in the presence of Shanon Rosser, MD. Electronically Signed: Judithann Sauger, ED Scribe. 11/14/15. 11:02 PM.   CHIEF COMPLAINT  Back Pain   HISTORY OF PRESENT ILLNESS   HPI Comments: Cindy Turner is a 30 y.o. female with a hx of fibromyalgia who presents to the Emergency Department complaining of gradually worsening bilateral lower back pain that radiates up her back towards her neck onset yesterday evening. She reports associated generalized headache. She notes that the pain is worse with ROM of her neck and there is pain with pressure in her lower back when she sits. She adds that when she touches her head to her chest, she experiences a sensation of electricity down her back. She explains that she had sudden onset of a sharp, severe lower back pain when she bent over to pick up something at the store today at 5 pm and had difficulty getting out of her car. She has had URI symptoms for several days. No alleviating factors noted. Pt has not tried any medications PTA. She denies any fever, chills, nausea, or vomiting.   Past Medical History:  Diagnosis Date  . Allergy   . Fibromyalgia   . Migraines   . Raynaud's disease   . Ureterolithiasis     Past Surgical History:  Procedure Laterality Date  . APPENDECTOMY    . BREAST BIOPSY    . DILATION AND CURETTAGE OF UTERUS    . kidney stent    . LAPAROSCOPY    . TONSILLECTOMY    . TUBAL LIGATION    . TUBAL LIGATION      Family History  Problem Relation Age of Onset  . Hypertension Mother   . Arthritis Mother   . Breast cancer Mother 24  . Uterine cancer Mother 44  . Breast cancer Maternal Grandmother     dx in her 45s  . Throat cancer Maternal Grandfather     smoker; dx in 7s   . Melanoma Paternal Grandfather   . Melanoma Maternal Aunt     dx in her 67s    Social History  Substance Use Topics  . Smoking status: Never Smoker  . Smokeless tobacco: Never Used  . Alcohol use No    Prior to Admission medications   Medication Sig Start Date End Date Taking? Authorizing Provider  MELOXICAM PO Take by mouth.    Historical Provider, MD  pregabalin (LYRICA) 25 MG capsule Take 25 mg by mouth 3 (three) times daily.    Historical Provider, MD  TRAMADOL HCL ER PO Take by mouth.    Historical Provider, MD  valACYclovir (VALTREX) 1000 MG tablet Take 1 tablet (1,000 mg total) by mouth 3 (three) times daily. 10/08/15   Billy Fischer, MD    Allergies Terbutaline sulfate; Vitamin a; and Latex   REVIEW OF SYSTEMS  Negative except as noted here or in the History of Present Illness.   PHYSICAL EXAMINATION  Initial Vital Signs Blood pressure 123/87, pulse 66, temperature 97.9 F (36.6 C), temperature source Oral, resp. rate 20, height 5\' 7"  (1.702 m), weight 133 lb (60.3 kg), last menstrual period 11/07/2015, SpO2 100 %.  Examination General: Well-developed, well-nourished female in no acute distress; appearance consistent with age of record HENT: normocephalic; atraumatic Eyes: pupils equal,  round and reactive to light; extraocular muscles intact Neck: supple; positive Lhermitte's sign Heart: regular rate and rhythm Lungs: clear to auscultation bilaterally Abdomen: soft; nondistended; nontender; no masses or hepatosplenomegaly; bowel sounds present Back: pain on flexion of T spine or L spine.  Extremities: No deformity; full range of motion; pulses normal; pain on movement of the hips Neurologic: Awake, alert and oriented; motor function intact in all extremities and symmetric; sensation intact and symmetric  Skin: Warm and dry Psychiatric: Normal mood and affect   RESULTS  Summary of this visit's results, reviewed by myself:   EKG  Interpretation  Date/Time:    Ventricular Rate:    PR Interval:    QRS Duration:   QT Interval:    QTC Calculation:   R Axis:     Text Interpretation:        Laboratory Studies: Results for orders placed or performed during the hospital encounter of 11/14/15 (from the past 24 hour(s))  CBC with Differential/Platelet     Status: Abnormal   Collection Time: 11/14/15 11:40 PM  Result Value Ref Range   WBC 4.8 4.0 - 10.5 K/uL   RBC 5.02 3.87 - 5.11 MIL/uL   Hemoglobin 12.9 12.0 - 15.0 g/dL   HCT 38.4 36.0 - 46.0 %   MCV 76.5 (L) 78.0 - 100.0 fL   MCH 25.7 (L) 26.0 - 34.0 pg   MCHC 33.6 30.0 - 36.0 g/dL   RDW 15.1 11.5 - 15.5 %   Platelets 204 150 - 400 K/uL   Neutrophils Relative % 50 %   Neutro Abs 2.4 1.7 - 7.7 K/uL   Lymphocytes Relative 37 %   Lymphs Abs 1.8 0.7 - 4.0 K/uL   Monocytes Relative 9 %   Monocytes Absolute 0.4 0.1 - 1.0 K/uL   Eosinophils Relative 3 %   Eosinophils Absolute 0.1 0.0 - 0.7 K/uL   Basophils Relative 1 %   Basophils Absolute 0.0 0.0 - 0.1 K/uL  Basic metabolic panel     Status: None   Collection Time: 11/14/15 11:40 PM  Result Value Ref Range   Sodium 136 135 - 145 mmol/L   Potassium 3.5 3.5 - 5.1 mmol/L   Chloride 104 101 - 111 mmol/L   CO2 24 22 - 32 mmol/L   Glucose, Bld 81 65 - 99 mg/dL   BUN 12 6 - 20 mg/dL   Creatinine, Ser 0.66 0.44 - 1.00 mg/dL   Calcium 9.4 8.9 - 10.3 mg/dL   GFR calc non Af Amer >60 >60 mL/min   GFR calc Af Amer >60 >60 mL/min   Anion gap 8 5 - 15   Imaging Studies: No results found.  Discussed with Dr. Nicole Kindred of neurology. Will obtain head CT here and then transfer to [facility] for MRI of the C-spine and T-spine.  ED COURSE  Nursing notes and initial vitals signs, including pulse oximetry, reviewed.  Vitals:   11/14/15 2234 11/14/15 2235  BP:  123/87  Pulse:  66  Resp:  20  Temp:  97.9 F (36.6 C)  TempSrc:  Oral  SpO2:  100%  Weight: 133 lb (60.3 kg)   Height: 5\' 7"  (1.702 m)      PROCEDURES    ED DIAGNOSES     ICD-9-CM ICD-10-CM   1. Neurogenic pain 729.2 M79.2     I personally performed the services described in this documentation, which was scribed in my presence. The recorded information has been reviewed and is accurate.  Shanon Rosser, MD 11/15/15 [id number]    Shanon Rosser, MD 11/15/15 [phone]

## 2015-11-14 NOTE — ED Triage Notes (Signed)
Lower back pain into her legs.

## 2015-11-15 ENCOUNTER — Emergency Department (HOSPITAL_COMMUNITY): Payer: Commercial Managed Care - HMO

## 2015-11-15 LAB — BASIC METABOLIC PANEL
Anion gap: 8 (ref 5–15)
BUN: 12 mg/dL (ref 6–20)
CALCIUM: 9.4 mg/dL (ref 8.9–10.3)
CO2: 24 mmol/L (ref 22–32)
CREATININE: 0.66 mg/dL (ref 0.44–1.00)
Chloride: 104 mmol/L (ref 101–111)
GFR calc non Af Amer: >60 mL/min (ref >60)
Glucose, Bld: 81 mg/dL (ref 65–99)
Potassium: 3.5 mmol/L (ref 3.5–5.1)
SODIUM: 136 mmol/L (ref 135–145)

## 2015-11-15 LAB — I-STAT BETA HCG BLOOD, ED (MC, WL, AP ONLY): I-stat hCG, quantitative: <5 m[IU]/mL (ref <5)

## 2015-11-15 MED ORDER — ONDANSETRON HCL 4 MG/2ML IJ SOLN
4.0000 mg | Freq: Once | INTRAMUSCULAR | Status: AC
  Administered 2015-11-15: 4 mg via INTRAVENOUS
  Filled 2015-11-15: qty 2

## 2015-11-15 MED ORDER — DIAZEPAM 5 MG/ML IJ SOLN
5.0000 mg | Freq: Once | INTRAMUSCULAR | Status: AC
  Administered 2015-11-15: 5 mg via INTRAVENOUS
  Filled 2015-11-15: qty 2

## 2015-11-15 MED ORDER — IBUPROFEN 600 MG PO TABS: 600.0000 mg | ORAL_TABLET | Freq: Four times a day (QID) | ORAL | 0 refills | Status: DC | PRN

## 2015-11-15 MED ORDER — MORPHINE SULFATE (PF) 4 MG/ML IV SOLN
4.0000 mg | Freq: Once | INTRAVENOUS | Status: AC
Start: 2015-11-15 — End: 2015-11-15
  Administered 2015-11-15: 4 mg via INTRAVENOUS
  Filled 2015-11-15: qty 1

## 2015-11-15 MED ORDER — HYDROMORPHONE HCL 1 MG/ML IJ SOLN
1.0000 mg | Freq: Once | INTRAMUSCULAR | Status: AC
  Administered 2015-11-15: 1 mg via INTRAVENOUS
  Filled 2015-11-15: qty 1

## 2015-11-15 MED ORDER — OXYCODONE-ACETAMINOPHEN 5-325 MG PO TABS: 1.0000 | ORAL_TABLET | Freq: Four times a day (QID) | ORAL | 0 refills | Status: DC | PRN

## 2015-11-15 MED ORDER — SODIUM CHLORIDE 0.9 % IV BOLUS (SEPSIS)
500.0000 mL | Freq: Once | INTRAVENOUS | Status: AC
  Administered 2015-11-15: 500 mL via INTRAVENOUS

## 2015-11-15 MED ORDER — DIAZEPAM 5 MG PO TABS: 5.0000 mg | ORAL_TABLET | Freq: Three times a day (TID) | ORAL | 0 refills | Status: DC | PRN

## 2015-11-15 NOTE — Discharge Planning (Signed)
[facility] reviewed discharging chart for possible CM needs. Will dc home with valium, prn percocet, motrin Rx.  Pt has [employer].  No needs CM identified.

## 2015-11-15 NOTE — ED Provider Notes (Signed)
  Physical Exam  BP 108/68   Pulse 66   Temp 97.9 F (36.6 C) (Oral)   Resp 16   Ht 5\' 7"  (1.702 m)   Wt 133 lb (60.3 kg)   LMP 11/07/2015   SpO2 99%   BMI 20.83 kg/m   Physical Exam  ED Course  Procedures  MDM Care assumed at 7 am. Patient has back spasms. Sent from [place] for MRI to r/o MS. MRI showed no lesions, no nerve impingement. Likely muscle spasms. Still has some spasms. Vitals stable. Will dc home with valium, prn percocet, motrin.    Drenda Freeze, MD 11/15/15 [phone]

## 2015-11-15 NOTE — ED Provider Notes (Signed)
Patient transferred from [facility] to have MRI performed. Patient was seen with complaints of diffuse neck and back pain. She was transferred to this ER to undergo MRI for further evaluation.  Focal examination: Musculoskeletal - extremely hyperesthetic. Light palpation anywhere on her cervical spine, thoracic spine and lumbar spine elicits pain, causing patient to pull away from palpation.  Neurologic - no weakness, numbness, tingling in extremities  Plan: Will undergo MRI. Dr. Andrew Au raise concern for MS as well as other spinal lesions. MRI should fully evaluate.   Orpah Greek, MD 11/15/15 [phone]

## 2015-11-15 NOTE — ED Notes (Signed)
Dian Situ (spouse) [phone]

## 2015-11-15 NOTE — ED Notes (Signed)
Valium given to patient for c/o clastrophobia and spasms

## 2015-11-15 NOTE — ED Notes (Signed)
Dr Pollina in room 

## 2015-11-15 NOTE — Discharge Instructions (Signed)
Take motrin for pain.   Take valium for spasms.   Take percocet for severe pain.   Try stretching exercises.   See your doctor  Return to ER if you have worse spasms, weakness, numbness, trouble walking.

## 2015-11-19 ENCOUNTER — Emergency Department (HOSPITAL_COMMUNITY)
Admission: EM | Admit: 2015-11-19 | Discharge: 2015-11-19 | Disposition: A | Discharge status: Home / Self Care | Payer: Commercial Managed Care - HMO | Attending: Emergency Medicine | Admitting: Emergency Medicine

## 2015-11-19 ENCOUNTER — Encounter (HOSPITAL_COMMUNITY): Payer: Self-pay

## 2015-11-19 ENCOUNTER — Telehealth (HOSPITAL_BASED_OUTPATIENT_CLINIC_OR_DEPARTMENT_OTHER): Payer: Self-pay | Admitting: Emergency Medicine

## 2015-11-19 DIAGNOSIS — Z91040 Latex allergy status: Secondary | ICD-10-CM | POA: Insufficient documentation

## 2015-11-19 DIAGNOSIS — M545 Low back pain: Secondary | ICD-10-CM | POA: Diagnosis not present

## 2015-11-19 DIAGNOSIS — R519 Headache, unspecified: Secondary | ICD-10-CM

## 2015-11-19 DIAGNOSIS — R51 Headache: Secondary | ICD-10-CM | POA: Insufficient documentation

## 2015-11-19 LAB — I-STAT CHEM 8, ED
BUN: 7 mg/dL (ref 6–20)
CALCIUM ION: 1.18 mmol/L (ref 1.15–1.40)
CREATININE: 0.7 mg/dL (ref 0.44–1.00)
Chloride: 103 mmol/L (ref 101–111)
Glucose, Bld: 84 mg/dL (ref 65–99)
HEMATOCRIT: 38 % (ref 36.0–46.0)
HEMOGLOBIN: 12.9 g/dL (ref 12.0–15.0)
Potassium: 3.2 mmol/L — ABNORMAL LOW (ref 3.5–5.1)
SODIUM: 141 mmol/L (ref 135–145)
TCO2: 24 mmol/L (ref 0–100)

## 2015-11-19 LAB — I-STAT BETA HCG BLOOD, ED (MC, WL, AP ONLY): I-stat hCG, quantitative: <5 m[IU]/mL (ref <5)

## 2015-11-19 MED ORDER — DEXAMETHASONE SODIUM PHOSPHATE 10 MG/ML IJ SOLN
10.0000 mg | Freq: Once | INTRAMUSCULAR | Status: AC
  Administered 2015-11-19: 10 mg via INTRAVENOUS
  Filled 2015-11-19: qty 1

## 2015-11-19 MED ORDER — SODIUM CHLORIDE 0.9 % IV BOLUS (SEPSIS)
1000.0000 mL | Freq: Once | INTRAVENOUS | Status: AC
  Administered 2015-11-19: 1000 mL via INTRAVENOUS

## 2015-11-19 MED ORDER — MECLIZINE HCL 25 MG PO TABS
25.0000 mg | ORAL_TABLET | Freq: Once | ORAL | Status: AC
  Administered 2015-11-19: 25 mg via ORAL
  Filled 2015-11-19: qty 1

## 2015-11-19 MED ORDER — METOCLOPRAMIDE HCL 5 MG/ML IJ SOLN
10.0000 mg | Freq: Once | INTRAMUSCULAR | Status: AC
  Administered 2015-11-19: 10 mg via INTRAVENOUS
  Filled 2015-11-19: qty 2

## 2015-11-19 NOTE — ED Notes (Signed)
Delos Haring, PA at bedside at this time.

## 2015-11-19 NOTE — ED Notes (Signed)
Pt unable to complete full set of orthostatic vitals due to being dizzy.

## 2015-11-19 NOTE — ED Provider Notes (Signed)
[facility] DEPT Provider Note   CSN: [id number] Arrival date & time: 11/19/15  0127  History   Chief Complaint Chief Complaint  Patient presents with  . Near Syncope  . Nausea  . Back Pain    HPI Cindy Turner is a 30 y.o. female.  HPI  Patient presents to hte ED with PMH of fibromyalgia, migraines, raynauds disease, ureterolithiasis, and allergies.  Pt was seem on 11/15/2015 after having bilateral low back pain that radiates up towards the neck with spasms. She was transferred from [facility] ER to [facility] hospital for MRI of her spine to r/o MS. Her MRI was negative for any abnormality, she was given an rx for Percocet and Valium and discharged home with diagnosis of back spasm. 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 She also says that she has a headache.  No N/V/D, no fever, no neck pain, no weakness. She bent over in a store on 10/6 and has had back pain since then with tingling sensations to all 4 extremities. Pt awake, alert and oriented.  Past Medical History:  Diagnosis Date  . Allergy   . Fibromyalgia   . Migraines   . Raynaud's disease   . Ureterolithiasis     Patient Active Problem List   Diagnosis Date Noted  . 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 Fibromyalgia 05/16/2011  . Abdominal pain, acute, right upper quadrant 09/01/2010    Past Surgical History:  Procedure Laterality Date  . APPENDECTOMY    . BREAST BIOPSY    . DILATION AND CURETTAGE OF UTERUS    . kidney stent    . LAPAROSCOPY    . TONSILLECTOMY    . TUBAL LIGATION    . TUBAL LIGATION      OB History    Gravida Para Term Preterm AB Living   3 2 2   1 3    SAB TAB Ectopic Multiple Live Births   1               Home Medications    Prior to Admission medications   Medication Sig Start Date End Date Taking? Authorizing Provider    diazepam (VALIUM) 5 MG tablet Take 1 tablet (5 mg total) by mouth every 8 (eight) hours as needed for anxiety (spasms). 11/15/15  Yes Drenda Freeze, MD  ibuprofen (ADVIL,MOTRIN) 600 MG tablet Take 1 tablet (600 mg total) by mouth every 6 (six) hours as needed. 11/15/15  Yes Drenda Freeze, MD  oxyCODONE-acetaminophen (PERCOCET) 5-325 MG tablet Take 1 tablet by mouth every 6 (six) hours as needed. 11/15/15  Yes Drenda Freeze, MD  pregabalin (LYRICA) 25 MG capsule Take 25 mg by mouth 3 (three) times daily.   Yes Historical Provider, MD  valACYclovir (VALTREX) 1000 MG tablet Take 1 tablet (1,000 mg total) by mouth 3 (three) times daily. Patient not taking: Reported on 11/19/2015 10/08/15   Billy Fischer, MD    Family History Family History  Problem Relation Age of Onset  . Hypertension Mother   . Arthritis Mother   . Breast cancer Mother 56  . Uterine cancer Mother 109  . Breast cancer Maternal Grandmother     dx in her 74s  . Throat cancer Maternal Grandfather     smoker; dx in 30s  .  Melanoma Paternal Grandfather   . Melanoma Maternal Aunt     dx in her 49s    Social History Social History  Substance Use Topics  . Smoking status: Never Smoker  . Smokeless tobacco: Never Used  . Alcohol use No     Allergies   Terbutaline sulfate; Vitamin a; and Latex   Review of Systems Review of Systems Review of Systems All other systems negative except as documented in the HPI. All pertinent positives and negatives as reviewed in the HPI.   Physical Exam Updated Vital Signs BP 101/73   Pulse 60   Temp 98.1 F (36.7 C) (Oral)   Resp 14   Ht 5\' 7"  (1.702 m)   Wt 60.3 kg   LMP 11/19/2015   SpO2 (!) 76%   BMI 20.83 kg/m   Physical Exam  Constitutional: She appears well-developed and well-nourished. No distress.  HENT:  Head: Normocephalic and atraumatic.  Right Ear: Tympanic membrane and ear canal normal.  Left Ear: Tympanic membrane and ear canal normal.  Nose: Nose  normal.  Mouth/Throat: Uvula is midline, oropharynx is clear and moist and mucous membranes are normal.  Eyes: Pupils are equal, round, and reactive to light.  Neck: Normal range of motion. Neck supple. No Kernig's sign noted.  Cardiovascular: Normal rate and regular rhythm.   Pulmonary/Chest: Effort normal.  Abdominal: Soft.  No signs of abdominal distention  Musculoskeletal:  No LE swelling  Neurological: She is alert.  Cranial nerves grossly intact on exam. Pt alert and oriented x 3 Upper and lower extremity strength is symmetrical and physiologic Normal muscular tone No facial droop Coordination intact, no limb ataxia,No pronator drift  Skin: Skin is warm and dry. No rash noted.  Nursing note and vitals reviewed.    ED Treatments / Results  Labs (all labs ordered are listed, but only abnormal results are displayed) Labs Reviewed  I-STAT CHEM 8, ED - Abnormal; Notable for the following:       Result Value   Potassium 3.2 (*)    All other components within normal limits  I-STAT BETA HCG BLOOD, ED ([facility], [facility], [facility] ONLY)    EKG  EKG Interpretation None       Radiology No results found.  Procedures Procedures (including critical care time)  Medications Ordered in ED Medications  sodium chloride 0.9 % bolus 1,000 mL (1,000 mLs Intravenous New Bag/Given 11/19/15 0424)  dexamethasone (DECADRON) injection 10 mg (10 mg Intravenous Given 11/19/15 0424)  meclizine (ANTIVERT) tablet 25 mg (25 mg Oral Given 11/19/15 0424)  metoCLOPramide (REGLAN) injection 10 mg (10 mg Intravenous Given 11/19/15 0424)     Initial Impression / Assessment and Plan / ED Course  I have reviewed the triage vital signs and the nursing notes.  Pertinent labs & imaging results that were available during my care of the patient were reviewed by me and considered in my medical decision making (see chart for details).  Clinical Course    Etiology of patients symptoms are not clear, but she has had  MRIs of her entire back which are unremarkable. She has no objective neurological deficits. She was given medication in the ED and she said that it completely resolved her symptoms. She ambulated in the hallway with a steady gait. Will refer to neurology for further work-up and management although I wonder if patient is having conversion disorder.  I offered to provide patient with work note but she says she started working again after being off for  7 weeks due to a shoulder injury and that she must go to work.  Final Clinical Impressions(s) / ED Diagnoses   Final diagnoses:  Nonintractable headache, unspecified chronicity pattern, unspecified headache type    New Prescriptions New Prescriptions   No medications on file     Delos Haring, PA-C 11/19/15 0530    Varney Biles, MD 11/20/15 2338

## 2015-11-19 NOTE — ED Triage Notes (Signed)
Pt brought by [facility] for near syncope, back pain, and nausea for the past year. Pt will have episodes of going cross eyed, but still responds to her name.

## 2015-11-19 NOTE — ED Notes (Signed)
Dr. Kathrynn Humble made aware of pt complaints and symptoms.

## 2015-11-19 NOTE — ED Notes (Signed)
Patient ambulated in hallway to restroom. RN and spouse walked beside patient. Patient ambulated back to room with spouse accompanying patient.

## 2015-11-20 ENCOUNTER — Encounter (HOSPITAL_BASED_OUTPATIENT_CLINIC_OR_DEPARTMENT_OTHER): Payer: Self-pay | Admitting: Emergency Medicine

## 2015-11-25 ENCOUNTER — Encounter: Payer: Self-pay | Admitting: Neurology

## 2015-11-25 ENCOUNTER — Telehealth: Payer: Self-pay | Admitting: Neurology

## 2015-11-25 ENCOUNTER — Ambulatory Visit: Payer: Self-pay | Admitting: Family Medicine

## 2015-11-25 ENCOUNTER — Ambulatory Visit (INDEPENDENT_AMBULATORY_CARE_PROVIDER_SITE_OTHER): Payer: Commercial Managed Care - HMO | Admitting: Neurology

## 2015-11-25 VITALS — BP 118/72 | HR 68 | Resp 16 | Ht 67.0 in | Wt 133.0 lb

## 2015-11-25 DIAGNOSIS — R5383 Other fatigue: Secondary | ICD-10-CM | POA: Diagnosis not present

## 2015-11-25 DIAGNOSIS — G2581 Restless legs syndrome: Secondary | ICD-10-CM

## 2015-11-25 DIAGNOSIS — G43009 Migraine without aura, not intractable, without status migrainosus: Secondary | ICD-10-CM

## 2015-11-25 DIAGNOSIS — G4700 Insomnia, unspecified: Secondary | ICD-10-CM | POA: Diagnosis not present

## 2015-11-25 DIAGNOSIS — M5481 Occipital neuralgia: Secondary | ICD-10-CM | POA: Diagnosis not present

## 2015-11-25 DIAGNOSIS — G4761 Periodic limb movement disorder: Secondary | ICD-10-CM | POA: Diagnosis not present

## 2015-11-25 DIAGNOSIS — M542 Cervicalgia: Secondary | ICD-10-CM | POA: Insufficient documentation

## 2015-11-25 DIAGNOSIS — I951 Orthostatic hypotension: Secondary | ICD-10-CM

## 2015-11-25 DIAGNOSIS — G90A Postural orthostatic tachycardia syndrome (POTS): Secondary | ICD-10-CM | POA: Insufficient documentation

## 2015-11-25 DIAGNOSIS — R000 Tachycardia, unspecified: Secondary | ICD-10-CM | POA: Diagnosis not present

## 2015-11-25 DIAGNOSIS — M797 Fibromyalgia: Secondary | ICD-10-CM

## 2015-11-25 DIAGNOSIS — R42 Dizziness and giddiness: Secondary | ICD-10-CM | POA: Diagnosis not present

## 2015-11-25 MED ORDER — PREGABALIN 100 MG PO CAPS: 100.0000 mg | ORAL_CAPSULE | Freq: Two times a day (BID) | ORAL | 5 refills | Status: DC

## 2015-11-25 MED ORDER — FLUDROCORTISONE ACETATE 0.1 MG PO TABS
0.1000 mg | ORAL_TABLET | Freq: Every day | ORAL | 5 refills | Status: DC
Start: 2015-11-25 — End: 2016-04-22

## 2015-11-25 MED ORDER — CYCLOBENZAPRINE HCL 5 MG PO TABS: 5.0000 mg | ORAL_TABLET | Freq: Three times a day (TID) | ORAL | 1 refills | Status: DC | PRN

## 2015-11-25 NOTE — Telephone Encounter (Signed)
Pt's husband called stating pt's migraine went away for a little while after injection today however it is back. It is also very tender around injection area. Please call and advise [phone]

## 2015-11-25 NOTE — Progress Notes (Signed)
[facility]  PATIENT: Cindy Turner DOB: 03-Jun-1985  REFERRING DOCTOR OR PCP:  Aura Dials SOURCE: Patient, ED notes, imaging reports, spine MRI images  was personally reviewed on PACS.  _________________________________   HISTORICAL  CHIEF COMPLAINT:  Chief Complaint  Patient presents with  . Headache    [name] is here for eval of h/a's.  Sts. she gets severe h/a's 2-3 times per month.  Sts. has never been on a daily preventative, never been on a rx. rescue med.  Sts. onset one week ago of constant h/a, typical for her migraines, right sided, behind right eye, but she is not able to get rid of it with otc meds, Valium or Ocycodone which were rx'd in the ER.  Sts. with neck/head movement, she gets electric shock sensations down back.  Occasional slurred speech. Constant dizziness, some worse with   . Dizziness    position changes.  Orthostatic VS comleted/fim  . Numbness    HISTORY OF PRESENT ILLNESS:  I had the pleasure seeing you patient, Cindy Turner, at [facility] for neurologic consultation regarding her headaches and dizziness.  She reports a history of headaches since childhood. For many years the headaches were occurring 2-3 times a month and would last 1-2 days. With the headache she will get nausea, photophobia and phonophobia. Moving the head makes the pain worse. The headaches have been worsening this year. Currently she has a headache that has lasted one week. This one is associated with pain entirely on the right side that is throbbing at times. The headache started in the 4 head then went to the occiput. It intensified quite a bit at that point and shot up and down. She has taken over-the-counter anti-inflammatory medications. Additionally she went to the emergency room last week and was given Valium and oxycodone (more for her back). Those only helped her for short period of time. She has never tried a triptan.  She has never been on prophylactic medications for the headaches.  She also reports pain in her spine. If she flexes her neck all the way to the chest she will get a shooting pain that goes down her back into the lower back. Additionally she is having lower back pain by itself. All of the spine pain intensified last week and she went to the emergency room on 11/15/2015. She had MRI of the cervical, thoracic and lumbar spine performed.   They were essentially normal.  She also has generalized myalgia and this has been present for a few years but is worse this year. She was once told she might have fibromyalgia. She also notes poor sleep. She wakes up quite a bit at night though she is able to fall asleep initially.    She also notes restless leg syndrome and she moves her legs a lot at night when she is asleep.  For about a year, she has had many episodes of lightheadedness and even one episode of syncope. These episodes fluctuate quite a bit and some months have been much worse than others. 3, and these are occurring she will stand up and get lightheaded she often has to get back down to a chair because she doesn't feel safe if she would stand up for longer period of time. One time she actually passed out. Most of the time, the spells occur while she is standing but sometimes she will get a lightheaded spell while laying down and feel very numb.   She reports having  a Holter monitor for a week. There was variation in her heart rate by her report but I do not have the official cardiology interpretation [facility]).   She notes some swelling in her feet, right greater than left. Sometimes the whole right leg will be more swollen.     REVIEW OF SYSTEMS: Constitutional: No fevers, chills, sweats, or change in appetite.   She notes Eyes: No visual changes, double vision, eye pain Ear, nose and throat: No hearing loss, ear pain, nasal congestion, sore throat Cardiovascular: No chest pain,  palpitations Respiratory: No shortness of breath at rest or with exertion.   No wheezes GastrointestinaI: No nausea, vomiting, diarrhea, abdominal pain, fecal incontinence Genitourinary: No dysuria, urinary retention or frequency.  No nocturia. Musculoskeletal: as above Integumentary: No rash, pruritus, skin lesions Neurological: as above Psychiatric: No depression at this time.  No anxiety Endocrine: No palpitations, diaphoresis, change in appetite, change in weigh or increased thirst Hematologic/Lymphatic: No anemia, purpura, petechiae. Allergic/Immunologic: No itchy/runny eyes, nasal congestion, recent allergic reactions, rashes  ALLERGIES: Allergies  Allergen Reactions  . Terbutaline Sulfate     arrythmia  . Vitamin A     Headaches   . Latex Rash    HOME MEDICATIONS:  Current Outpatient Prescriptions:  .  ibuprofen (ADVIL,MOTRIN) 600 MG tablet, Take 1 tablet (600 mg total) by mouth every 6 (six) hours as needed., Disp: 30 tablet, Rfl: 0 .  pregabalin (LYRICA) 100 MG capsule, Take 1 capsule (100 mg total) by mouth 2 (two) times daily., Disp: 60 capsule, Rfl: 5 .  cyclobenzaprine (FLEXERIL) 5 MG tablet, Take 1 tablet (5 mg total) by mouth every 8 (eight) hours as needed for muscle spasms., Disp: 30 tablet, Rfl: 1 .  diazepam (VALIUM) 5 MG tablet, Take 1 tablet (5 mg total) by mouth every 8 (eight) hours as needed for anxiety (spasms). (Patient not taking: Reported on 11/25/2015), Disp: 10 tablet, Rfl: 0 .  fludrocortisone (FLORINEF) 0.1 MG tablet, Take 1 tablet (0.1 mg total) by mouth daily., Disp: 30 tablet, Rfl: 5 .  oxyCODONE-acetaminophen (PERCOCET) 5-325 MG tablet, Take 1 tablet by mouth every 6 (six) hours as needed. (Patient not taking: Reported on 11/25/2015), Disp: 10 tablet, Rfl: 0 .  valACYclovir (VALTREX) 1000 MG tablet, Take 1 tablet (1,000 mg total) by mouth 3 (three) times daily. (Patient not taking: Reported on 11/25/2015), Disp: 21 tablet, Rfl: 0  PAST MEDICAL  HISTORY: Past Medical History:  Diagnosis Date  . Allergy   . Fibromyalgia   . Migraines   . Raynaud's disease   . Ureterolithiasis     PAST SURGICAL HISTORY: Past Surgical History:  Procedure Laterality Date  . APPENDECTOMY    . BREAST BIOPSY    . DILATION AND CURETTAGE OF UTERUS    . kidney stent    . LAPAROSCOPY    . TONSILLECTOMY    . TUBAL LIGATION    . TUBAL LIGATION      FAMILY HISTORY: Family History  Problem Relation Age of Onset  . Hypertension Mother   . Arthritis Mother   . Breast cancer Mother 86  . Uterine cancer Mother 76  . Breast cancer Maternal Grandmother     dx in her 32s  . Throat cancer Maternal Grandfather     smoker; dx in 44s  . Melanoma Paternal Grandfather   . Melanoma Maternal Aunt     dx in her 59s    SOCIAL HISTORY:  Social History   Social History  .  Marital status: Married    Spouse name: N/A  . Number of children: N/A  . Years of education: N/A   Occupational History  . Not on file.   Social History Main Topics  . Smoking status: Never Smoker  . Smokeless tobacco: Never Used  . Alcohol use No  . Drug use: No  . Sexual activity: Yes    Partners: Male    Birth control/ protection: None     Comment: tubal ligation   Other Topics Concern  . Not on file   Social History Narrative  . No narrative on file     PHYSICAL EXAM  Vitals:   11/25/15 0934  BP: 118/72  Pulse: 68  Resp: 16  Weight: 133 lb (60.3 kg)  Height: 5\' 7"  (1.702 m)    Body mass index is 20.83 kg/m.   Orthostatic VS for the past 24 hrs:  BP- Lying Pulse- Lying BP- Sitting Pulse- Sitting BP- Standing at 0 minutes Pulse- Standing at 0 minutes  11/25/15 0934 116/81 68 104/76 68 121/81 84      General: The patient is well-developed and well-nourished and in no acute distress  Eyes:  Funduscopic exam shows normal optic discs and retinal vessels.  Neck: The neck is supple, no carotid bruits are noted.  The neck is very tender at the  occiput. Lifting the head slightly reduces the pain. Range of motion is reduced in the neck.  Cardiovascular: The heart has a regular rate and rhythm with a normal S1 and S2. There were no murmurs, gallops or rubs. Lungs are clear to auscultation.  Skin: Extremities are without significant edema.  Musculoskeletal:  Back is nontender  Neurologic Exam  Mental status: The patient is alert and oriented x 3 at the time of the examination. The patient has apparent normal recent and remote memory, with an apparently normal attention span and concentration ability.   Speech is normal.  Cranial nerves: Extraocular movements are full. Pupils are equal, round, and reactive to light and accomodation.  Visual fields are full.  Facial symmetry is present. There is good facial sensation to soft touch bilaterally.Facial strength is normal.  Trapezius and sternocleidomastoid strength is normal. No dysarthria is noted.  The tongue is midline, and the patient has symmetric elevation of the soft palate. No obvious hearing deficits are noted.  Motor:  Muscle bulk is normal.   Tone is normal. Strength is  5 / 5 in all 4 extremities.   Sensory: Sensory testing is intact to pinprick, soft touch and vibration sensation in all 4 extremities.  Coordination: Cerebellar testing reveals good finger-nose-finger and heel-to-shin bilaterally.  Gait and station: Station is normal.   Gait is normal. Tandem gait is normal. Romberg is negative.   Reflexes: Deep tendon reflexes are symmetric and normal bilaterally.   Plantar responses are flexor.    DIAGNOSTIC DATA (LABS, IMAGING, TESTING) - I reviewed patient records, labs, notes, testing and imaging myself where available.  Lab Results  Component Value Date   WBC 4.8 11/14/2015   HGB 12.9 11/19/2015   HCT 38.0 11/19/2015   MCV 76.5 (L) 11/14/2015   PLT 204 11/14/2015      Component Value Date/Time   NA 141 11/19/2015 0243   K 3.2 (L) 11/19/2015 0243   CL 103  11/19/2015 0243   CO2 24 11/14/2015 2340   GLUCOSE 84 11/19/2015 0243   BUN 7 11/19/2015 0243   CREATININE 0.70 11/19/2015 0243   CALCIUM 9.4 11/14/2015 2340  PROT 7.1 10/23/2014 1933   ALBUMIN 4.0 10/23/2014 1933   AST 17 10/23/2014 1933   ALT 17 10/23/2014 1933   ALKPHOS 44 10/23/2014 1933   BILITOT 0.8 10/23/2014 1933   GFRNONAA >60 11/14/2015 2340   GFRAA >60 11/14/2015 2340    Lab Results  Component Value Date   TSH 1.091 10/23/2014       ASSESSMENT AND PLAN  Fibromyalgia  Common migraine without intractability  Occipital neuralgia of right side  Neck pain  POTS (postural orthostatic tachycardia syndrome) - Plan: Ferritin, TSH, VITAMIN D 25 Hydroxy (Vit-D Deficiency, Fractures)  Lightheadedness - Plan: VITAMIN D 25 Hydroxy (Vit-D Deficiency, Fractures)  Other fatigue - Plan: Ferritin, TSH, VITAMIN D 25 Hydroxy (Vit-D Deficiency, Fractures)  Restless leg - Plan: Ferritin  Insomnia, unspecified type  Periodic limb movement disorder (PLMD)   In summary, Ms. Lafleche is a 30 year old woman with headaches, episodes of presyncope, generalized myalgia and insomnia. She was very tender over the right occiput and I did a splenius capitis trigger point injection with 80 mg Depo-Medrol in 3 mL Marcaine. This will also cover the occipital nerve. Pain was much better a few minutes later and she was able to move her head and neck that her.  Besides headaches, she also has pain and sleep difficulty consistent with fibromyalgia.  I will have her increase her Lyrica to 100 mg by mouth twice a day in the hope that this will help her neuralgic pain better. Additionally, I will have her take Flexeril 5 mg nightly to help with her myalgias and poor sleep.   Her third issue is frequent episodes of presyncope. She did have a postural increase in her pulse that could be related to postural orthostatic tachycardic syndrome (POTS) and I will have her take low dose fludrocortisone which is  beneficial in the majority of patients.     I will also check blood work for ferritin, TSH and vitamin D because of her restless leg syndrome, fatigue and other symptoms. She will return to see me in 4-6 weeks or sooner if there are new or worsening neurologic symptoms.  Thank you for asking me to see Mrs. Ms [facility] for a neurologic consultation. Please let me know if I can be of further assistance with her or other patients in the future.   Richard A. Felecia Shelling, MD, PhD 0000000, 0000000 PM Certified in Neurology, Clinical Neurophysiology, Sleep Medicine, Pain Medicine and Neuroimaging  [facility] [address] [place], [place] [address] [phone]

## 2015-11-26 LAB — TSH: TSH: 0.495 u[IU]/mL (ref 0.450–4.500)

## 2015-11-26 LAB — VITAMIN D 25 HYDROXY (VIT D DEFICIENCY, FRACTURES): VIT D 25 HYDROXY: 36.4 ng/mL (ref 30.0–100.0)

## 2015-11-26 LAB — FERRITIN: FERRITIN: 25 ng/mL (ref 15–150)

## 2015-11-26 NOTE — Telephone Encounter (Signed)
I have spoken with Cindy Turner this afternoon.  RAS did an ONB for her yesterday, which she did get immed. relief from.  Sts. she has some post-procedural tenderness at the inj. site, which I have explained is normal.  She also sts. head hurts more with moving neck.  RAS gave rx for Flexeril and increased her Lyrica to 100mg  bid.  She should take these meds for a good few wks  to realize their full benefit.  She verbalized understanding of same/fim

## 2015-11-27 NOTE — Telephone Encounter (Signed)
I have spoken with Cindy Turner this morning and per RAS, advised that labwork done in our office was normal.  Vit. D level was low end of normal, so she can get an otc vit. D supplement to boost this a little.  She verbalized understanding of same/fim

## 2015-11-27 NOTE — Telephone Encounter (Signed)
-----   Message from Britt Bottom, MD sent at 11/26/2015  5:35 PM EDT ----- Please let her know that the blood work was normal.   Vitamin D was low normal so she may want to get an over-the-counter supplement to take daily.

## 2015-12-24 ENCOUNTER — Encounter: Payer: Self-pay | Admitting: Neurology

## 2015-12-24 ENCOUNTER — Ambulatory Visit (INDEPENDENT_AMBULATORY_CARE_PROVIDER_SITE_OTHER): Payer: Commercial Managed Care - HMO | Admitting: Neurology

## 2015-12-24 VITALS — BP 128/68 | HR 66 | Resp 14 | Ht 67.0 in | Wt 132.0 lb

## 2015-12-24 DIAGNOSIS — G90A Postural orthostatic tachycardia syndrome (POTS): Secondary | ICD-10-CM

## 2015-12-24 DIAGNOSIS — M797 Fibromyalgia: Secondary | ICD-10-CM | POA: Diagnosis not present

## 2015-12-24 DIAGNOSIS — R000 Tachycardia, unspecified: Secondary | ICD-10-CM

## 2015-12-24 DIAGNOSIS — I951 Orthostatic hypotension: Secondary | ICD-10-CM | POA: Diagnosis not present

## 2015-12-24 DIAGNOSIS — G43009 Migraine without aura, not intractable, without status migrainosus: Secondary | ICD-10-CM

## 2015-12-24 DIAGNOSIS — G4700 Insomnia, unspecified: Secondary | ICD-10-CM

## 2015-12-24 DIAGNOSIS — M542 Cervicalgia: Secondary | ICD-10-CM | POA: Diagnosis not present

## 2015-12-24 MED ORDER — CYCLOBENZAPRINE HCL 5 MG PO TABS: 5.0000 mg | ORAL_TABLET | Freq: Three times a day (TID) | ORAL | 5 refills | Status: DC | PRN

## 2015-12-24 NOTE — Progress Notes (Signed)
[facility]  PATIENT: Cindy Turner DOB: [birth year]  REFERRING DOCTOR OR PCP:  Aura Dials SOURCE: Patient, ED notes, imaging reports, spine MRI images  was personally reviewed on PACS.  _________________________________   HISTORICAL  CHIEF COMPLAINT:  Chief Complaint  Patient presents with  . Headaches    Sts. h/a is much better since inj. at last ov. H/A just recently returned 3 days ago--left sided "pressure."  Sts. increasing Lyrica to 100mg  bid helped generalized nerve pain she has.  Sts. she sleeps better with Flexeril.  Sts. presyncope has improved with Fludrocortisone--has been a little more dizzy the last 3 days with h/a/fim  . Fibromyalgia    HISTORY OF PRESENT ILLNESS:  I had the pleasure seeing you patient, Cindy Turner, at [facility] for neurologic consultation regarding her headaches and dizziness.  HA/neck pain:     Her headache improved dramatically after the occipital nerve block and she was headache free x 3 weeks.  The past week, she has had some pain, only on the left.    Nothing really triggers the pain when they occur.   No change in vision.   When she has neck pain/HA sometimes she gets pain shooting down hr spine, usually after bending her neck.   MRI's of her spine were normal.    FMS:   Increasing the Lyrica has helped her FMS pain.   She tolerates it well.  When present, pain is R > L and mostly near the spine in the upper body and in the right lower back down to the legs lower down.   Bending her ankles sometimes increase the leg pain.  Lifting something heavier may lead her back to spasm.  Presyncope:   She has had orthostatic presyncope sometimes associated with tachycardia and she may have POTS>   On fludrocortisone, she feels she is doing better with only a couple milder episodes.  Insomnia:   She has been sleeping better with bedtime flexeril.   If she misses the dose, she is more tired and  achy the next day.      HA History:  She reports a history of headaches since childhood. For many years the headaches were occurring 2-3 times a month and would last 1-2 days. With the headache she will get nausea, photophobia and phonophobia. Moving the head makes the pain worse. The headaches have been worsening this year. Currently she has a headache that has lasted one week. This one is associated with pain entirely on the right side that is throbbing at times. The headache started in the 4 head then went to the occiput. It intensified quite a bit at that point and shot up and down. She has taken over-the-counter anti-inflammatory medications. Additionally she went to the emergency room last week and was given Valium and oxycodone (more for her back). Those only helped her for short period of time. She has never tried a triptan. She has never been on prophylactic medications for the headaches.  Presyncope History:  Since 2016, she has had many episodes of lightheadedness and even one episode of syncope. These episodes fluctuate quite a bit and some months have been much worse than others. 3, and these are occurring she will stand up and get lightheaded she often has to get back down to a chair because she doesn't feel safe if she would stand up for longer period of time. One time she actually passed out. Most of the time, the spells occur while  she is standing but sometimes she will get a lightheaded spell while laying down and feel very numb.   She reports having a Holter monitor for a week. There was variation in her heart rate by her report but I do not have the official cardiology interpretation [facility]).   She notes some swelling in her feet, right greater than left. Sometimes the whole right leg will be more swollen.     REVIEW OF SYSTEMS: Constitutional: No fevers, chills, sweats, or change in appetite.   She notes Eyes: No visual changes, double vision, eye pain Ear, nose and  throat: No hearing loss, ear pain, nasal congestion, sore throat Cardiovascular: No chest pain, palpitations Respiratory: No shortness of breath at rest or with exertion.   No wheezes GastrointestinaI: No nausea, vomiting, diarrhea, abdominal pain, fecal incontinence Genitourinary: No dysuria, urinary retention or frequency.  No nocturia. Musculoskeletal: as above Integumentary: No rash, pruritus, skin lesions Neurological: as above Psychiatric: No depression at this time.  No anxiety Endocrine: No palpitations, diaphoresis, change in appetite, change in weigh or increased thirst Hematologic/Lymphatic: No anemia, purpura, petechiae. Allergic/Immunologic: No itchy/runny eyes, nasal congestion, recent allergic reactions, rashes  ALLERGIES: Allergies  Allergen Reactions  . Terbutaline Sulfate     arrythmia  . Vitamin A     Headaches   . Latex Rash    HOME MEDICATIONS:  Current Outpatient Prescriptions:  .  cyclobenzaprine (FLEXERIL) 5 MG tablet, Take 1 tablet (5 mg total) by mouth every 8 (eight) hours as needed for muscle spasms., Disp: 30 tablet, Rfl: 5 .  fludrocortisone (FLORINEF) 0.1 MG tablet, Take 1 tablet (0.1 mg total) by mouth daily., Disp: 30 tablet, Rfl: 5 .  ibuprofen (ADVIL,MOTRIN) 600 MG tablet, Take 1 tablet (600 mg total) by mouth every 6 (six) hours as needed., Disp: 30 tablet, Rfl: 0 .  pregabalin (LYRICA) 100 MG capsule, Take 1 capsule (100 mg total) by mouth 2 (two) times daily., Disp: 60 capsule, Rfl: 5 .  diazepam (VALIUM) 5 MG tablet, Take 1 tablet (5 mg total) by mouth every 8 (eight) hours as needed for anxiety (spasms). (Patient not taking: Reported on 12/24/2015), Disp: 10 tablet, Rfl: 0 .  oxyCODONE-acetaminophen (PERCOCET) 5-325 MG tablet, Take 1 tablet by mouth every 6 (six) hours as needed. (Patient not taking: Reported on 12/24/2015), Disp: 10 tablet, Rfl: 0 .  valACYclovir (VALTREX) 1000 MG tablet, Take 1 tablet (1,000 mg total) by mouth 3 (three)  times daily. (Patient not taking: Reported on 12/24/2015), Disp: 21 tablet, Rfl: 0  PAST MEDICAL HISTORY: Past Medical History:  Diagnosis Date  . Allergy   . Fibromyalgia   . Migraines   . Raynaud's disease   . Ureterolithiasis     PAST SURGICAL HISTORY: Past Surgical History:  Procedure Laterality Date  . APPENDECTOMY    . BREAST BIOPSY    . DILATION AND CURETTAGE OF UTERUS    . kidney stent    . LAPAROSCOPY    . TONSILLECTOMY    . TUBAL LIGATION    . TUBAL LIGATION      FAMILY HISTORY: Family History  Problem Relation Age of Onset  . Hypertension Mother   . Arthritis Mother   . Breast cancer Mother 21  . Uterine cancer Mother 34  . Breast cancer Maternal Grandmother     dx in her 76s  . Throat cancer Maternal Grandfather     smoker; dx in 63s  . Melanoma Paternal Grandfather   . Melanoma Maternal  Aunt     dx in her 48s    SOCIAL HISTORY:  Social History   Social History  . Marital status: Married    Spouse name: N/A  . Number of children: N/A  . Years of education: N/A   Occupational History  . Not on file.   Social History Main Topics  . Smoking status: Never Smoker  . Smokeless tobacco: Never Used  . Alcohol use No  . Drug use: No  . Sexual activity: Yes    Partners: Male    Birth control/ protection: None     Comment: tubal ligation   Other Topics Concern  . Not on file   Social History Narrative  . No narrative on file     PHYSICAL EXAM  Vitals:   12/24/15 0826  BP: 128/68  Pulse: 66  Resp: 14  Weight: 132 lb (59.9 kg)  Height: 5\' 7"  (1.702 m)    Body mass index is 20.67 kg/m.   No data found.     General: The patient is well-developed and well-nourished and in no acute distress  Neck: The neck is supple, no carotid bruits are noted.  The neck is mildly tender at the left occiput.    Neurologic Exam  Mental status: The patient is alert and oriented x 3 at the time of the examination. The patient has apparent  normal recent and remote memory, with an apparently normal attention span and concentration ability.   Speech is normal.  Cranial nerves: Extraocular movements are full.  There is good facial sensation to soft touch bilaterally.Facial strength is normal.  Trapezius and sternocleidomastoid strength is normal. No dysarthria is noted.  The tongue is midline, and the patient has symmetric elevation of the soft palate. No obvious hearing deficits are noted.  Motor:  Muscle bulk is normal.   Tone is normal. Strength is  5 / 5 in all 4 extremities.   Sensory: Sensory testing is intact to touch in all 4 extremities.  Coordination: Cerebellar testing reveals good finger-nose-finger  bilaterally.  Gait and station: Station is normal.   Gait is normal. Tandem gait is normal.    Reflexes: Deep tendon reflexes are symmetric and normal bilaterally.       DIAGNOSTIC DATA (LABS, IMAGING, TESTING) - I reviewed patient records, labs, notes, testing and imaging myself where available.  Lab Results  Component Value Date   WBC 4.8 11/14/2015   HGB 12.9 11/19/2015   HCT 38.0 11/19/2015   MCV 76.5 (L) 11/14/2015   PLT 204 11/14/2015      Component Value Date/Time   NA 141 11/19/2015 0243   K 3.2 (L) 11/19/2015 0243   CL 103 11/19/2015 0243   CO2 24 11/14/2015 2340   GLUCOSE 84 11/19/2015 0243   BUN 7 11/19/2015 0243   CREATININE 0.70 11/19/2015 0243   CALCIUM 9.4 11/14/2015 2340   PROT 7.1 10/23/2014 1933   ALBUMIN 4.0 10/23/2014 1933   AST 17 10/23/2014 1933   ALT 17 10/23/2014 1933   ALKPHOS 44 10/23/2014 1933   BILITOT 0.8 10/23/2014 1933   GFRNONAA >60 11/14/2015 2340   GFRAA >60 11/14/2015 2340    Lab Results  Component Value Date   TSH 0.495 11/25/2015       ASSESSMENT AND PLAN  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   She will continue Lyrica for fibromyalgia  at the current  dose.   Also she will take Flexeril at night to help the insomnia and hopefully bump neuralgia pain the next day.   If pain returns, consider addition of duloxetine. 2.   For suspected POTS, she will continue low-dose fludrocortisone. 3.   She will return to see me in 4 months or sooner if there are new or worsening neurologic symptoms.  Cindy Turner A. Felecia Shelling, MD, PhD [id number], [id number] AM Certified in Neurology, Clinical Neurophysiology, Sleep Medicine, Pain Medicine and Neuroimaging  [facility] [address] [place], [place] [address] [phone]   `

## 2015-12-25 ENCOUNTER — Other Ambulatory Visit: Payer: Self-pay | Admitting: Family Medicine

## 2015-12-25 ENCOUNTER — Encounter: Payer: Self-pay | Admitting: Family Medicine

## 2015-12-25 ENCOUNTER — Ambulatory Visit (INDEPENDENT_AMBULATORY_CARE_PROVIDER_SITE_OTHER): Payer: Commercial Managed Care - HMO | Admitting: Family Medicine

## 2015-12-25 DIAGNOSIS — Z808 Family history of malignant neoplasm of other organs or systems: Secondary | ICD-10-CM

## 2015-12-25 DIAGNOSIS — M797 Fibromyalgia: Secondary | ICD-10-CM

## 2015-12-25 DIAGNOSIS — F4323 Adjustment disorder with mixed anxiety and depressed mood: Secondary | ICD-10-CM

## 2015-12-25 DIAGNOSIS — F429 Obsessive-compulsive disorder, unspecified: Secondary | ICD-10-CM | POA: Insufficient documentation

## 2015-12-25 DIAGNOSIS — Z833 Family history of diabetes mellitus: Secondary | ICD-10-CM

## 2015-12-25 DIAGNOSIS — G90A Postural orthostatic tachycardia syndrome (POTS): Secondary | ICD-10-CM

## 2015-12-25 DIAGNOSIS — Z803 Family history of malignant neoplasm of breast: Secondary | ICD-10-CM

## 2015-12-25 DIAGNOSIS — Z8049 Family history of malignant neoplasm of other genital organs: Secondary | ICD-10-CM

## 2015-12-25 DIAGNOSIS — R000 Tachycardia, unspecified: Secondary | ICD-10-CM

## 2015-12-25 DIAGNOSIS — S4991XA Unspecified injury of right shoulder and upper arm, initial encounter: Secondary | ICD-10-CM | POA: Insufficient documentation

## 2015-12-25 DIAGNOSIS — G4709 Other insomnia: Secondary | ICD-10-CM

## 2015-12-25 DIAGNOSIS — G43009 Migraine without aura, not intractable, without status migrainosus: Secondary | ICD-10-CM

## 2015-12-25 DIAGNOSIS — I951 Orthostatic hypotension: Secondary | ICD-10-CM

## 2015-12-25 DIAGNOSIS — S6991XD Unspecified injury of right wrist, hand and finger(s), subsequent encounter: Secondary | ICD-10-CM

## 2015-12-25 DIAGNOSIS — Z1231 Encounter for screening mammogram for malignant neoplasm of breast: Secondary | ICD-10-CM

## 2015-12-25 NOTE — Assessment & Plan Note (Addendum)
Advised to see a counselor for cognitive behavioral therapy. Gave names. Patient, she will call on her own.  - Follow-up to discuss medications in the future as she sees necessary  Used to be on Diazepam in past.  Told pt I am happy to refer her to psychiatry if she feels she needs med mgt since diff to tx in past and needed Psychiatry for [facility]

## 2015-12-25 NOTE — Assessment & Plan Note (Addendum)
Txed by Dr Felecia Shelling- Neuro- txs her FM now- with lyrica. Recently went up on dose.  Takes advil prn, natural substances

## 2015-12-25 NOTE — Patient Instructions (Addendum)
Call Cindy Turner- need to see counselor wkly or every two weeks    PLEASE NOTE:  The office will be calling if your labs / recent test results are not within acceptable normal values within one week of them being done.    Furthermore, you'll be able to review all of your results in "My Chart," when they become available; so please sign-up if you have not already done so.   Keep a copy of these results in your personal home file to share with your other physicians as warranted. If you have any further questions or concerns please do not hesitate to contact us.   Thank you and we appreciate you choosing [place] as your primary care provider.   - 'the Team' at [facility] in near future if it has not been done in the past 12 months-please call for an appointment on your own. This is very important to your health.   Please realize, EXERCISE IS MEDICINE!  -  American Heart Association [facility]) guidelines for exercise : If you are in good health, without any medical conditions, you should engage in 150 minutes of moderate intensity aerobic activity per week.  This means you should be huffing and puffing throughout your workout.   Engaging in regular exercise will improve brain function and memory, as well as improve mood, boost immune system and help with weight management.  As well as the other, more well-known effects of exercise such as decreasing blood sugar levels, decreasing blood pressure,  and decreasing bad cholesterol levels/ increasing good cholesterol levels.     -  The AHA strongly endorses consumption of a diet that contains a variety of foods from all the food categories with an emphasis on fruits and vegetables; fat-free and low-fat dairy products; cereal and grain products; legumes and nuts; and fish, poultry, and/or extra lean meats.    Excessive food intake, especially of foods high in saturated and trans fats, sugar, and salt, should be avoided.     Adequate water intake of roughly 1/2 of your weight in pounds, should equal the ounces of water per day you should drink.  So for instance, if you're 200 pounds, that would be 100 ounces of water per day.         Mediterranean Diet  Why follow it? Research shows. . Those who follow the Mediterranean diet have a reduced risk of heart disease  . The diet is associated with a reduced incidence of Parkinson's and Alzheimer's diseases . People following the diet may have longer life expectancies and lower rates of chronic diseases  . The Dietary Guidelines for Americans recommends the Mediterranean diet as an eating plan to promote health and prevent disease  What Is the Mediterranean Diet?  . Healthy eating plan based on typical foods and recipes of Mediterranean-style cooking . The diet is primarily a plant based diet; these foods should make up a majority of meals   Starches - Plant based foods should make up a majority of meals - They are an important sources of vitamins, minerals, energy, antioxidants, and fiber - Choose whole grains, foods high in fiber and minimally processed items  - Typical grain sources include wheat, oats, barley, corn, brown rice, bulgar, farro, millet, polenta, couscous  - Various types of beans include chickpeas, lentils, fava beans, black beans, white beans   Fruits  Veggies - Large quantities of antioxidant rich fruits & veggies; 6 or  more servings  - Vegetables can be eaten raw or lightly drizzled with oil and cooked  - Vegetables common to the traditional Mediterranean Diet include: artichokes, arugula, beets, broccoli, brussel sprouts, cabbage, carrots, celery, collard greens, cucumbers, eggplant, kale, leeks, lemons, lettuce, mushrooms, okra, onions, peas, peppers, potatoes, pumpkin, radishes, rutabaga, shallots, spinach, sweet potatoes, turnips, zucchini - Fruits common to the Mediterranean Diet include: apples, apricots, avocados, cherries, clementines,  dates, figs, grapefruits, grapes, melons, nectarines, oranges, peaches, pears, pomegranates, strawberries, tangerines  Fats - Replace butter and margarine with healthy oils, such as olive oil, canola oil, and tahini  - Limit nuts to no more than a handful a day  - Nuts include walnuts, almonds, pecans, pistachios, pine nuts  - Limit or avoid candied, honey roasted or heavily salted nuts - Olives are central to the [employer] - can be eaten whole or used in a variety of dishes   Meats Protein - Limiting red meat: no more than a few times a month - When eating red meat: choose lean cuts and keep the portion to the size of deck of cards - Eggs: approx. 0 to 4 times a week  - Fish and lean poultry: at least 2 a week  - Healthy protein sources include, chicken, [place], lean beef, lamb - Increase intake of seafood such as tuna, salmon, trout, mackerel, shrimp, scallops - Avoid or limit high fat processed meats such as sausage and bacon  Dairy - Include moderate amounts of low fat dairy products  - Focus on healthy dairy such as fat free yogurt, skim milk, low or reduced fat cheese - Limit dairy products higher in fat such as whole or 2% milk, cheese, ice cream  Alcohol - Moderate amounts of red wine is ok  - No more than 5 oz daily for women (all ages) and men older than age 36  - No more than 10 oz of wine daily for men younger than 29  Other - Limit sweets and other desserts  - Use herbs and spices instead of salt to flavor foods  - Herbs and spices common to the traditional Mediterranean Diet include: basil, bay leaves, chives, cloves, cumin, fennel, garlic, lavender, marjoram, mint, oregano, parsley, pepper, rosemary, sage, savory, sumac, tarragon, thyme   It's not just a diet, it's a lifestyle:  . The Mediterranean diet includes lifestyle factors typical of those in the region  . Foods, drinks and meals are best eaten with others and savored . Daily physical activity is important for  overall good health . This could be strenuous exercise like running and aerobics . This could also be more leisurely activities such as walking, housework, yard-work, or taking the stairs . Moderation is the key; a balanced and healthy diet accommodates most foods and drinks . Consider portion sizes and frequency of consumption of certain foods   Meal Ideas & Options:  . Breakfast:  o Whole wheat toast or whole wheat English muffins with peanut butter & hard boiled egg o Steel cut oats topped with apples & cinnamon and skim milk  o Fresh fruit: banana, strawberries, melon, berries, peaches  o Smoothies: strawberries, bananas, greek yogurt, peanut butter o Low fat greek yogurt with blueberries and granola  o Egg white omelet with spinach and mushrooms o Breakfast couscous: whole wheat couscous, apricots, skim milk, cranberries  . Sandwiches:  o Hummus and grilled vegetables (peppers, zucchini, squash) on whole wheat bread   o Grilled chicken on whole wheat pita with lettuce,  tomatoes, cucumbers or tzatziki  o [employer] on whole wheat bread: tuna salad made with greek yogurt, olives, red peppers, capers, green onions o Garlic rosemary lamb pita: lamb sauted with garlic, rosemary, salt & pepper; add lettuce, cucumber, greek yogurt to pita - flavor with lemon juice and black pepper  . Seafood:  o Mediterranean grilled salmon, seasoned with garlic, basil, parsley, lemon juice and black pepper o Shrimp, lemon, and spinach whole-grain pasta salad made with low fat greek yogurt  o Seared scallops with lemon orzo  o Seared tuna steaks seasoned salt, pepper, coriander topped with tomato mixture of olives, tomatoes, olive oil, minced garlic, parsley, green onions and cappers  . Meats:  o Herbed greek chicken salad with kalamata olives, cucumber, feta  o Red bell peppers stuffed with spinach, bulgur, lean ground beef (or lentils) & topped with feta   o Kebabs: skewers of chicken, tomatoes, onions,  zucchini, squash  o [place] burgers: made with red onions, mint, dill, lemon juice, feta cheese topped with roasted red peppers . Vegetarian o Cucumber salad: cucumbers, artichoke hearts, celery, red onion, feta cheese, tossed in olive oil & lemon juice  o Hummus and whole grain pita points with a greek salad (lettuce, tomato, feta, olives, cucumbers, red onion) o Lentil soup with celery, carrots made with vegetable broth, garlic, salt and pepper  o Tabouli salad: parsley, bulgur, mint, scallions, cucumbers, tomato, radishes, lemon juice, olive oil, salt and pepper.

## 2015-12-25 NOTE — Progress Notes (Signed)
New patient office visit note:  Impression and Recommendations:    1. Fibromyalgia   2. Other insomnia   3. Family history of diabetes mellitus   4. Obsessive-compulsive disorder, unspecified type   5. Adjustment disorder with mixed anxiety and depressed mood   6. Family history of breast cancer in first degree relative   7. Family history of uterine cancer- Mom age 32   8. Family history of malignant melanoma of skin- in 30's   9. POTS (postural orthostatic tachycardia syndrome)   10. Injury of right wrist, subsequent encounter   11. Common migraine without intractability     Fibromyalgia Txed by Dr Felecia Shelling- Neuro- txs her FM now- with lyrica. Recently went up on dose.  Takes advil prn, natural substances  Adjustment disorder with mixed anxiety and depressed mood Advised to see a counselor for cognitive behavioral therapy. Gave names. Patient, she will call on her own.  - Follow-up to discuss medications in the future as she sees necessary  Used to be on Diazepam in past.  Told pt I am happy to refer her to psychiatry if she feels she needs med mgt since diff to tx in past and needed Psychiatry for mgt  Family history of breast cancer in first degree relative- mother age 73 -  Will need Mammo in near future if it has not been done in the past 12 months-please call for an appointment on your own. This is very important to your health.  Right wrist injury and R shoulder- injured at work- ortho Txmnt per ortho  Insomnia Pt uses Flexeril at night- by Dr Felecia Shelling - txing her for FM pain assoc with insomnia apparently.  Advised add melatonin 5-10mg  nitely as well prn   Orders Placed This Encounter  Procedures  . MM Digital Diagnostic Bilat  . CBC with Differential/Platelet  . COMPLETE METABOLIC PANEL WITH GFR  . Hemoglobin A1c  . Lipid panel  . T4, free  . TSH  . VITAMIN D 25 Hydroxy (Vit-D Deficiency, Fractures)    Med list updated today---> nothing was  discontinued by me. Discontinued Medications   DIAZEPAM (VALIUM) 5 MG TABLET    Take 1 tablet (5 mg total) by mouth every 8 (eight) hours as needed for anxiety (spasms).   OXYCODONE-ACETAMINOPHEN (PERCOCET) 5-325 MG TABLET    Take 1 tablet by mouth every 6 (six) hours as needed.   VALACYCLOVIR (VALTREX) 1000 MG TABLET    Take 1 tablet (1,000 mg total) by mouth 3 (three) times daily.     Return for Appt near future for FBW; then OV with me to be determined pending results, Follow-up of current medical issues.  The patient was counseled, risk factors were discussed, anticipatory guidance given.  Gross side effects, risk and benefits, and alternatives of medications discussed with patient.  Patient is aware that all medications have potential side effects and we are unable to predict every side effect or drug-drug interaction that may occur.  Expresses verbal understanding and consents to current therapy plan and treatment regimen.  Please see AVS handed out to patient at the end of our visit for further patient instructions/ counseling done pertaining to today's office visit.    Note: This document was prepared using Dragon voice recognition software and may include unintentional dictation errors.  ----------------------------------------------------------------------------------------------------------------------    Subjective:    Chief Complaint  Patient presents with  . Establish Care    HPI: Cindy Turner is a  pleasant 30 y.o. female who presents to [place] at [facility] today to review their medical history with me and establish care.   I asked the patient to review their chronic problem list with me to ensure everything was updated and accurate.     Prior PCP-  [facility]- Dr Sheryn Bison- retiring.   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  Was CNA prior. - FT  FT- mom- 2 girls, ages 49, 49; Married 29yrs  Mitzi Hansen.  Neuro-  Dr Felecia Shelling.  Sees for POTS,  PLMD, Occipital neuralgia, dizziness, insomnia  Getting PT- R shoulder and wrist injury- led to her quitting work- [employer].  [facility]   Cards- Dr Einar Gip- irreg heartbeat- see's him for palpitations  GYN- she sees Nelva Bush- [facility]  Pt struggles with depressed mood mixed with anxious feelings at times. Acute on chronic.   Trouble sleeping if she can't turn her mind off.  Has seen psych in past. Hasn't gone in long time. No S/I    Patient Care Team    Relationship Specialty Notifications Start End  Mellody Dance, DO PCP - General Family Medicine  12/25/15   Reche [facility], PA-C Consulting Physician Orthopedic Surgery  12/25/15   Adrian Prows, MD Consulting Physician Cardiology  12/25/15   Britt Bottom, MD  Neurology  12/25/15      Wt Readings from Last 3 Encounters:  12/25/15 138 lb 4.8 oz (62.7 kg)  12/24/15 132 lb (59.9 kg)  11/25/15 133 lb (60.3 kg)   BP Readings from Last 3 Encounters:  12/25/15 124/78  12/24/15 128/68  11/25/15 118/72   Pulse Readings from Last 3 Encounters:  12/25/15 72  12/24/15 66  11/25/15 68   BMI Readings from Last 3 Encounters:  12/25/15 21.66 kg/m  12/24/15 20.67 kg/m  11/25/15 20.83 kg/m   No results found for: HGBA1C  Patient Active Problem List   Diagnosis Date Noted  . Family history of diabetes mellitus 12/25/2015    Priority: High  . Adjustment disorder with mixed anxiety and depressed mood 12/25/2015    Priority: High  . Right shoulder injury- ortho 12/25/2015  . OCD (obsessive compulsive disorder) 12/25/2015  . Family history of breast cancer in first degree relative- mother age 41 12/25/2015  . Family history of uterine cancer- Mom age 33 12/25/2015  . Family history of malignant melanoma of skin- in 30's 12/25/2015  . Common migraine without intractability 11/25/2015  . Occipital neuralgia of right side 11/25/2015  . Neck pain 11/25/2015  . POTS (postural orthostatic tachycardia  syndrome) 11/25/2015  . Lightheadedness 11/25/2015  . Insomnia 11/25/2015  . Periodic limb movement disorder (PLMD) 11/25/2015  . Right wrist injury and R shoulder- injured at work- ortho 09/11/2015  . 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 Fibromyalgia 05/16/2011  . Abdominal pain, acute, right upper quadrant 09/01/2010     Past Medical History:  Diagnosis Date  . Allergy   . Fibromyalgia   . Migraines   . Raynaud's disease   . Ureterolithiasis      Past Surgical History:  Procedure Laterality Date  . APPENDECTOMY    . BREAST BIOPSY    . DILATION AND CURETTAGE OF UTERUS    . kidney stent    . LAPAROSCOPY    . TONSILLECTOMY    . TUBAL LIGATION    . TUBAL LIGATION       Family History  Problem Relation Age of Onset  . Hypertension Mother   .  Arthritis Mother   . Breast cancer Mother 48  . Uterine cancer Mother   . Legg-Calve-Perthes disease Father   . Healthy Brother   . Asthma Daughter   . Healthy Daughter   . Breast cancer Maternal Grandmother     dx in her 62s  . Throat cancer Maternal Grandfather     smoker; dx in 60s  . Melanoma Paternal Grandfather   . Hypertension Paternal Grandfather   . Diabetes Paternal Grandfather   . Heart attack Paternal Grandfather   . Melanoma Maternal Aunt     dx in her 84s     History  Drug Use No    History  Alcohol Use No    History  Smoking Status  . Never Smoker  Smokeless Tobacco  . Never Used    Patient's Medications  New Prescriptions   No medications on file  Previous Medications   B-COMPLEX-C TABS    Take 1 tablet by mouth daily.   CYCLOBENZAPRINE (FLEXERIL) 5 MG TABLET    Take 1 tablet (5 mg total) by mouth every 8 (eight) hours as needed for muscle spasms.   FLUDROCORTISONE (FLORINEF) 0.1 MG TABLET    Take 1 tablet (0.1 mg total) by mouth daily.   GINKGO BILOBA (GNP GINGKO BILOBA EXTRACT PO)    Take 1 tablet by mouth daily.   IBUPROFEN (ADVIL,MOTRIN) 600 MG TABLET    Take 1 tablet (600 mg total)  by mouth every 6 (six) hours as needed.   PREGABALIN (LYRICA) 100 MG CAPSULE    Take 1 capsule (100 mg total) by mouth 2 (two) times daily.   TURMERIC 500 MG CAPS    Take 1 capsule by mouth daily.   VITAMIN C (ASCORBIC ACID) 500 MG TABLET    Take 500 mg by mouth daily as needed.  Modified Medications   No medications on file  Discontinued Medications   DIAZEPAM (VALIUM) 5 MG TABLET    Take 1 tablet (5 mg total) by mouth every 8 (eight) hours as needed for anxiety (spasms).   OXYCODONE-ACETAMINOPHEN (PERCOCET) 5-325 MG TABLET    Take 1 tablet by mouth every 6 (six) hours as needed.   VALACYCLOVIR (VALTREX) 1000 MG TABLET    Take 1 tablet (1,000 mg total) by mouth 3 (three) times daily.    Allergies: Terbutaline sulfate; Vitamin a; and Latex  Review of Systems  Constitutional: Negative.  Negative for chills, diaphoresis, fever, malaise/fatigue and weight loss.  HENT: Negative.  Negative for congestion, sore throat and tinnitus.   Eyes: Negative for blurred vision, double vision and photophobia.       Change in vision- chronic   Respiratory: Negative.  Negative for cough and wheezing.   Cardiovascular: Positive for palpitations. Negative for chest pain.       Chronic heart palp--> followed by Cards  Gastrointestinal: Negative.  Negative for blood in stool, diarrhea, nausea and vomiting.  Genitourinary: Negative for dysuria, frequency and urgency.       Unusual vaginal bleeding- chronic problem being followed by GYN  Musculoskeletal: Positive for myalgias. Negative for joint pain.       FM, PLMD etc-  Sees Neuro  Skin: Negative.  Negative for itching and rash.  Neurological: Positive for headaches. Negative for dizziness, focal weakness and weakness.       Chronic HA migraines--> Dr Felecia Shelling- Neuro  Endo/Heme/Allergies: Positive for environmental allergies. Negative for polydipsia. Does not bruise/bleed easily.  Psychiatric/Behavioral: Positive for depression. Negative for hallucinations,  memory loss,  substance abuse and suicidal ideas. The patient is nervous/anxious and has insomnia.      Objective:   Blood pressure 124/78, pulse 72, height 5\' 7"  (1.702 m), weight 138 lb 4.8 oz (62.7 kg), last menstrual period 12/24/2015. Body mass index is 21.66 kg/m. General: Well Developed, well nourished, and in no acute distress.  Neuro: Alert and oriented x3, extra-ocular muscles intact, sensation grossly intact.  HEENT: Normocephalic, atraumatic, pupils equal round reactive to light, neck supple Skin: no gross suspicious lesions or rashes  Cardiac: Regular rate and rhythm, no murmurs rubs or gallops.  Respiratory: Essentially clear to auscultation bilaterally. Not using accessory muscles, speaking in full sentences.  Abdominal: Soft, not grossly distended Musculoskeletal: Ambulates w/o diff, FROM * 4 ext.  Vasc: less 2 sec cap RF, warm and pink  Psych:  No HI/SI, judgement and insight good, Euthymic mood. Full Affect.

## 2015-12-30 ENCOUNTER — Other Ambulatory Visit: Payer: Self-pay | Admitting: Family Medicine

## 2015-12-30 DIAGNOSIS — N6452 Nipple discharge: Secondary | ICD-10-CM

## 2015-12-30 DIAGNOSIS — N644 Mastodynia: Secondary | ICD-10-CM

## 2016-01-08 ENCOUNTER — Ambulatory Visit
Admission: RE | Admit: 2016-01-08 | Discharge: 2016-01-08 | Disposition: A | Discharge status: Home / Self Care | Payer: Commercial Managed Care - HMO | Source: Ambulatory Visit | Attending: Family Medicine | Admitting: Family Medicine

## 2016-01-08 ENCOUNTER — Other Ambulatory Visit: Payer: Self-pay

## 2016-01-08 DIAGNOSIS — N6452 Nipple discharge: Secondary | ICD-10-CM

## 2016-01-08 DIAGNOSIS — N644 Mastodynia: Secondary | ICD-10-CM

## 2016-01-10 ENCOUNTER — Other Ambulatory Visit: Payer: Self-pay | Admitting: Family Medicine

## 2016-01-10 DIAGNOSIS — N6452 Nipple discharge: Secondary | ICD-10-CM

## 2016-01-10 NOTE — Assessment & Plan Note (Signed)
Pt uses Flexeril at night- by Dr Felecia Shelling - txing her for FM pain assoc with insomnia apparently.  Advised add melatonin 5-10mg  nitely as well prn

## 2016-01-10 NOTE — Assessment & Plan Note (Signed)
Txmnt per ortho

## 2016-01-10 NOTE — Assessment & Plan Note (Signed)
-    Will need Mammo in near future if it has not been done in the past 12 months-please call for an appointment on your own. This is very important to your health.

## 2016-01-14 ENCOUNTER — Telehealth: Payer: Self-pay | Admitting: Neurology

## 2016-01-14 NOTE — Telephone Encounter (Signed)
I have spoken with Cindy Turner this afternoon.  She sts. neck is swollen/tender.  Appt. given for 0830 tomorrow am/fim

## 2016-01-14 NOTE — Telephone Encounter (Signed)
Patient is calling. She had a migraine from last Wednesday to last Saturday, got relief on Sunday and Monday and today migraine is back. She says the pain is coming from the injection site where Dr. Felecia Shelling injected medication. Please call and discuss.

## 2016-01-15 ENCOUNTER — Ambulatory Visit: Payer: Self-pay | Admitting: Family Medicine

## 2016-01-15 ENCOUNTER — Ambulatory Visit: Payer: Self-pay | Admitting: Neurology

## 2016-01-16 ENCOUNTER — Encounter: Payer: Self-pay | Admitting: Neurology

## 2016-01-16 ENCOUNTER — Ambulatory Visit (INDEPENDENT_AMBULATORY_CARE_PROVIDER_SITE_OTHER): Payer: Commercial Managed Care - HMO | Admitting: Family Medicine

## 2016-01-16 ENCOUNTER — Encounter: Payer: Self-pay | Admitting: Family Medicine

## 2016-01-16 VITALS — BP 124/76 | HR 71 | Temp 98.6°F | Resp 18 | Ht 67.0 in | Wt 134.0 lb

## 2016-01-16 DIAGNOSIS — R319 Hematuria, unspecified: Secondary | ICD-10-CM

## 2016-01-16 DIAGNOSIS — M545 Low back pain, unspecified: Secondary | ICD-10-CM | POA: Insufficient documentation

## 2016-01-16 DIAGNOSIS — R300 Dysuria: Secondary | ICD-10-CM

## 2016-01-16 DIAGNOSIS — N926 Irregular menstruation, unspecified: Secondary | ICD-10-CM | POA: Diagnosis not present

## 2016-01-16 LAB — POCT URINALYSIS DIPSTICK
Bilirubin, UA: NEGATIVE
GLUCOSE UA: NEGATIVE
Ketones, UA: NEGATIVE
NITRITE UA: NEGATIVE
PROTEIN UA: 30
Spec Grav, UA: 1.02
UROBILINOGEN UA: 1
pH, UA: 7

## 2016-01-16 LAB — POCT URINE PREGNANCY: Preg Test, Ur: NEGATIVE

## 2016-01-16 MED ORDER — CIPROFLOXACIN HCL 500 MG PO TABS: 500.0000 mg | ORAL_TABLET | Freq: Two times a day (BID) | ORAL | 0 refills | Status: DC

## 2016-01-16 MED ORDER — HYDROCODONE-IBUPROFEN 7.5-200 MG PO TABS: 1.0000 | ORAL_TABLET | Freq: Three times a day (TID) | ORAL | 0 refills | Status: DC | PRN

## 2016-01-16 NOTE — Patient Instructions (Signed)

## 2016-01-16 NOTE — Progress Notes (Signed)
 .    Subjective:    Patient ID: Cindy Turner, female    DOB: 09/25/1985, 30 y.o.   MRN: [id number]  HPI  Dysuria - [place] complains of chills, strong smelling urine, frequent urination, bladder fullness, back pain, bladder cramping and pressure in bladder for 2 days. Denies fever and sweats. She denies recent antibiotic use or catheterization. She has not taken AZO. She has had more than 4 UTI's in the last year.  Highest temp- 97.7.  Constipated, No N/V, no diarhea    Has gotten 5 UTI's since February.  Saw Urologist 5-6 yrs ago from kidney stone.   GYN- Nelva Bush   Vaginal discharge - She reports thick discharge for a couple of days, no odor.   Past mental history, medications, allergies, surgical history reviewed with patient. Proper changes are made.  Review of Systems  Constitutional: Positive for chills. Negative for fever.  HENT: Negative.   Eyes: Negative.   Respiratory: Negative.   Gastrointestinal: Negative for diarrhea, nausea and vomiting.  Genitourinary: Positive for dysuria, frequency, hematuria, pelvic pain and urgency.  Musculoskeletal: Positive for back pain.  Psychiatric/Behavioral: Negative for confusion.       Objective:   Physical Exam  Constitutional: She is oriented to person, place, and time. She appears well-developed and well-nourished. No distress.  HENT:  Head: Normocephalic and atraumatic.  Eyes: EOM are normal.  Neck: Normal range of motion.  Cardiovascular: Normal rate, regular rhythm and normal heart sounds.   Pulmonary/Chest: Effort normal. No respiratory distress. She has no rales.  Abdominal: Bowel sounds are normal. There is tenderness. There is guarding.  Grossly tender to even light touch over her entire abdomen worse in the bilateral lower extremity is.   Musculoskeletal:  Exquisitely tender to light touch over bilateral costophrenic angles.  Neurological: She is alert and oriented to person, place, and time.  Skin: Skin  is warm and dry.  Psychiatric: She has a normal mood and affect. Her behavior is normal. Judgment and thought content normal.          Assessment & Plan:   Dysuria - Plan: POCT Urinalysis Dipstick, Urine Culture  Acute bilateral low back pain without sciatica  Painless hematuria  Irregular periods - Plan: POCT urine pregnancy  UA positive in the office today. Treated for urinary tract infection with Cipro as to cover for renal stones. Patient is critically tender in office today if pain worsens she will go to the emergency room. Explained this could be nephrolithiasis as well. Recommend straining urine with cheesecloth.  Pain meds given, importance of hydration discussed. If worsens-to ED for CT scan and workup for nephrolithiasis.  - The fifth. Tract infection she's had since February 2017-urology consult made. - Also discussed with patient could be from her irregular periods and a female issue-she will discuss with her GYN in the near future.  - POCT Urinalysis Dipstick - Urine Culture  - POCT urine pregnancy- negative  Dysuria - UA dipstick - positive for blood and leu. Urine culture pending.

## 2016-01-17 ENCOUNTER — Ambulatory Visit: Payer: Self-pay | Admitting: Family Medicine

## 2016-01-17 ENCOUNTER — Telehealth: Payer: Self-pay | Admitting: Family Medicine

## 2016-01-17 DIAGNOSIS — G8929 Other chronic pain: Secondary | ICD-10-CM | POA: Insufficient documentation

## 2016-01-17 DIAGNOSIS — N803 Endometriosis of pelvic peritoneum, unspecified: Secondary | ICD-10-CM | POA: Insufficient documentation

## 2016-01-17 DIAGNOSIS — N760 Acute vaginitis: Secondary | ICD-10-CM | POA: Insufficient documentation

## 2016-01-17 DIAGNOSIS — Z9851 Tubal ligation status: Secondary | ICD-10-CM | POA: Insufficient documentation

## 2016-01-17 DIAGNOSIS — R102 Pelvic and perineal pain: Secondary | ICD-10-CM

## 2016-01-17 NOTE — Telephone Encounter (Signed)
Okay that's fine

## 2016-01-17 NOTE — Telephone Encounter (Signed)
Cindy Turner called into the office today about the Urology referral that was talked about yesterday during her office visit.  She is requesting the referral be for Dr. Bjorn Loser at [facility].

## 2016-01-19 LAB — URINE CULTURE

## 2016-01-20 ENCOUNTER — Other Ambulatory Visit: Payer: Self-pay

## 2016-01-20 DIAGNOSIS — N390 Urinary tract infection, site not specified: Secondary | ICD-10-CM

## 2016-01-20 NOTE — Telephone Encounter (Signed)
Referral placed.  T. Contrell Ballentine, CMA 

## 2016-01-20 NOTE — Progress Notes (Signed)
Pt called requesting referral to [facility], Dr. Matilde Sprang.  Order for referral placed.  Charyl Bigger, CMA

## 2016-01-23 ENCOUNTER — Other Ambulatory Visit: Payer: Self-pay

## 2016-01-30 ENCOUNTER — Encounter: Payer: Self-pay | Admitting: Family Medicine

## 2016-02-04 ENCOUNTER — Other Ambulatory Visit: Payer: Self-pay | Admitting: Neurology

## 2016-02-19 ENCOUNTER — Emergency Department (HOSPITAL_BASED_OUTPATIENT_CLINIC_OR_DEPARTMENT_OTHER): Payer: Commercial Managed Care - HMO

## 2016-02-19 ENCOUNTER — Emergency Department (HOSPITAL_BASED_OUTPATIENT_CLINIC_OR_DEPARTMENT_OTHER)
Admission: EM | Admit: 2016-02-19 | Discharge: 2016-02-19 | Disposition: A | Discharge status: Home / Self Care | Payer: Commercial Managed Care - HMO | Attending: Emergency Medicine | Admitting: Emergency Medicine

## 2016-02-19 ENCOUNTER — Encounter (HOSPITAL_BASED_OUTPATIENT_CLINIC_OR_DEPARTMENT_OTHER): Payer: Self-pay

## 2016-02-19 DIAGNOSIS — J40 Bronchitis, not specified as acute or chronic: Secondary | ICD-10-CM

## 2016-02-19 DIAGNOSIS — R05 Cough: Secondary | ICD-10-CM | POA: Diagnosis not present

## 2016-02-19 DIAGNOSIS — Z79899 Other long term (current) drug therapy: Secondary | ICD-10-CM | POA: Diagnosis not present

## 2016-02-19 LAB — RAPID STREP SCREEN (MED CTR MEBANE ONLY): Streptococcus, Group A Screen (Direct): NEGATIVE

## 2016-02-19 MED ORDER — AZITHROMYCIN 250 MG PO TABS: 250.0000 mg | ORAL_TABLET | Freq: Every day | ORAL | 0 refills | Status: DC

## 2016-02-19 MED ORDER — AZITHROMYCIN 250 MG PO TABS
500.0000 mg | ORAL_TABLET | Freq: Once | ORAL | Status: AC
  Administered 2016-02-19: 500 mg via ORAL
  Filled 2016-02-19: qty 2

## 2016-02-19 MED ORDER — HYDROCODONE-HOMATROPINE 5-1.5 MG/5ML PO SYRP: 5.0000 mL | ORAL_SOLUTION | Freq: Four times a day (QID) | ORAL | 0 refills | Status: DC | PRN

## 2016-02-19 MED ORDER — DEXAMETHASONE 6 MG PO TABS
16.0000 mg | ORAL_TABLET | Freq: Once | ORAL | Status: AC
  Administered 2016-02-19: 16 mg via ORAL
  Filled 2016-02-19: qty 1

## 2016-02-19 NOTE — ED Triage Notes (Signed)
Cough x 1 week-NAD-steady gait

## 2016-02-19 NOTE — ED Provider Notes (Signed)
[facility] Provider Note   CSN: [id number] Arrival date & time: 02/19/16  2139  By signing my name below, I, Hansel Feinstein, attest that this documentation has been prepared under the direction and in the presence of Merrily Pew, MD. Electronically Signed: Hansel Feinstein, ED Scribe. 02/19/16. 10:44 PM.   History   Chief Complaint Chief Complaint  Patient presents with  . Cough    HPI Cindy Turner is a 31 y.o. female who presents to the ED complaining of cough x 1 week with associated chest congestion, sore throat, and horseness. No known sick contacts with similar symptoms. Pt reports h/o bronchitis and notes her symptoms are similar. The patient is scheduled to followup with primary care tomorrow. She is taking OTC therapy at home without improvement. She does not take breathing treatments at home. She has not sought physician evaluation before today. She has a history of strep throat. Denies fever, rash. She has no other concerns at this time. Pt is a non smoker, non-drinker and denies illegal drug use.   The history is provided by the patient. No language interpreter was used.    Past Medical History:  Diagnosis Date  . Allergy   . Fibromyalgia   . Migraines   . Raynaud's disease   . Ureterolithiasis     Patient Active Problem List   Diagnosis Date Noted  . Dysuria 01/16/2016  . Acute bilateral low back pain 01/16/2016  . Painless hematuria 01/16/2016  . Irregular periods 01/16/2016  . Right shoulder injury- ortho 12/25/2015  . Family history of diabetes mellitus 12/25/2015  . OCD (obsessive compulsive disorder) 12/25/2015  . Adjustment disorder with mixed anxiety and depressed mood 12/25/2015  . Family history of breast cancer in first degree relative- mother age 57 12/25/2015  . Family history of uterine cancer- Mom age 61 12/25/2015  . Family history of malignant melanoma of skin- in 30's 12/25/2015  . Common migraine without intractability  11/25/2015  . Occipital neuralgia of right side 11/25/2015  . Neck pain 11/25/2015  . POTS (postural orthostatic tachycardia syndrome) 11/25/2015  . Lightheadedness 11/25/2015  . Insomnia 11/25/2015  . Periodic limb movement disorder (PLMD) 11/25/2015  . Right wrist injury and R shoulder- injured at work- ortho 09/11/2015  . 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 Fibromyalgia 05/16/2011  . Abdominal pain, acute, right upper quadrant 09/01/2010    Past Surgical History:  Procedure Laterality Date  . APPENDECTOMY    . BREAST BIOPSY    . DILATION AND CURETTAGE OF UTERUS    . kidney stent    . LAPAROSCOPY    . TONSILLECTOMY    . TUBAL LIGATION    . TUBAL LIGATION      OB History    Gravida Para Term Preterm AB Living   3 2 2   1 3    SAB TAB Ectopic Multiple Live Births   1               Home Medications    Prior to Admission medications   Medication Sig Start Date End Date Taking? Authorizing Provider  B-Complex-C TABS Take 1 tablet by mouth daily.    Historical Provider, MD  cyclobenzaprine (FLEXERIL) 5 MG tablet Take 1 tablet (5 mg total) by mouth every 8 (eight) hours as needed for muscle spasms. 12/24/15   Britt Bottom, MD  fludrocortisone (FLORINEF) 0.1 MG tablet Take 1 tablet (0.1 mg total) by mouth daily. 11/25/15   Britt Bottom, MD  Ginkgo Biloba (GNP GINGKO BILOBA EXTRACT PO) Take 1 tablet by mouth daily.    Historical Provider, MD  pregabalin (LYRICA) 100 MG capsule Take 1 capsule (100 mg total) by mouth 2 (two) times daily. 11/25/15   Britt Bottom, MD  Turmeric 500 MG CAPS Take 1 capsule by mouth daily.    Historical Provider, MD  vitamin C (ASCORBIC ACID) 500 MG tablet Take 500 mg by mouth daily as needed.    Historical Provider, MD    Family History Family History  Problem Relation Age of Onset  . Hypertension Mother   . Arthritis Mother   . Breast cancer Mother 65  . Uterine cancer Mother   . Legg-Calve-Perthes disease Father   . Healthy Brother    . Asthma Daughter   . Healthy Daughter   . Breast cancer Maternal Grandmother     dx in her 55s  . Throat cancer Maternal Grandfather     smoker; dx in 4s  . Melanoma Paternal Grandfather   . Hypertension Paternal Grandfather   . Diabetes Paternal Grandfather   . Heart attack Paternal Grandfather   . Melanoma Maternal Aunt     dx in her 8s    Social History Social History  Substance Use Topics  . Smoking status: Never Smoker  . Smokeless tobacco: Never Used  . Alcohol use No     Allergies   Terbutaline sulfate; Vitamin a; and Latex   Review of Systems Review of Systems  Constitutional: Negative for fever.  HENT: Positive for congestion and sore throat.   Respiratory: Positive for cough.   Skin: Negative for rash.  All other systems reviewed and are negative.    Physical Exam Updated Vital Signs BP 124/75 (BP Location: Left Arm)   Pulse 86   Temp 98.1 F (36.7 C) (Oral)   Resp 20   LMP 02/18/2016   SpO2 100%   Physical Exam  Constitutional: She appears well-developed and well-nourished.  HENT:  Head: Normocephalic.  There is plaque and petechiae to the posterior oropharynx.   Eyes: Conjunctivae are normal.  Neck:  There is anterior chain cervical lymphadenopathy.   Cardiovascular: Normal rate, regular rhythm and normal heart sounds.   Pulmonary/Chest: Effort normal. No respiratory distress. She has wheezes.  There is wheezing in all lung fields bilaterally. Patient is tachypneic.   Abdominal: She exhibits no distension.  Musculoskeletal: Normal range of motion.  Lymphadenopathy:    She has cervical adenopathy.  Neurological: She is alert.  Skin: Skin is warm and dry.  Psychiatric: She has a normal mood and affect. Her behavior is normal.  Nursing note and vitals reviewed.    ED Treatments / Results  DIAGNOSTIC STUDIES: Oxygen Saturation is 100% on RA, normal by my interpretation.    COORDINATION OF CARE: 10:40 PM Discussed treatment plan  with pt at bedside and pt agreed to plan.     Labs (all labs ordered are listed, but only abnormal results are displayed) Labs Reviewed  RAPID STREP SCREEN (NOT AT [facility])  CULTURE, GROUP A STREP [facility])    EKG  EKG Interpretation None       Radiology No results found.  Procedures Procedures (including critical care time)  Medications Ordered in ED Medications  dexamethasone (DECADRON) tablet 16 mg (not administered)     Initial Impression / Assessment and Plan / ED Course  I have reviewed the triage vital signs and the nursing notes.  Pertinent labs & imaging results that were available during  my care of the patient were reviewed by me and considered in my medical decision making (see chart for details).  Clinical Course     Chest x-ray negative for any consolidated pneumonia. Her strep screen was negative for any evidence of strep pharyngitis. Patient given steroids and antibiotics here for likely bronchitis. Patient did not want albuterol as this makes her have SVT episodes. Had bilateral throat pain but no obvious swelling or asymmetry to suggest peritonsillar abscess or retropharyngeal abscess.  Final Clinical Impressions(s) / ED Diagnoses   Final diagnoses:  None    New Prescriptions New Prescriptions   No medications on file   I personally performed the services described in this documentation, which was scribed in my presence. The recorded information has been reviewed and is accurate.     Merrily Pew, MD 02/20/16 [phone]

## 2016-02-20 ENCOUNTER — Ambulatory Visit: Payer: Self-pay | Admitting: Family Medicine

## 2016-02-22 LAB — CULTURE, GROUP A STREP (THRC)

## 2016-03-23 DIAGNOSIS — M797 Fibromyalgia: Secondary | ICD-10-CM | POA: Diagnosis not present

## 2016-03-23 DIAGNOSIS — R102 Pelvic and perineal pain: Secondary | ICD-10-CM | POA: Diagnosis not present

## 2016-03-23 DIAGNOSIS — N926 Irregular menstruation, unspecified: Secondary | ICD-10-CM | POA: Diagnosis not present

## 2016-03-26 ENCOUNTER — Other Ambulatory Visit: Payer: Self-pay | Admitting: Neurology

## 2016-04-15 ENCOUNTER — Ambulatory Visit: Payer: Self-pay | Admitting: Adult Health

## 2016-04-22 ENCOUNTER — Encounter: Payer: Self-pay | Admitting: Neurology

## 2016-04-22 ENCOUNTER — Ambulatory Visit (INDEPENDENT_AMBULATORY_CARE_PROVIDER_SITE_OTHER): Payer: Commercial Managed Care - HMO | Admitting: Neurology

## 2016-04-22 VITALS — BP 100/62 | HR 68 | Resp 16 | Ht 67.0 in | Wt 138.0 lb

## 2016-04-22 DIAGNOSIS — G43009 Migraine without aura, not intractable, without status migrainosus: Secondary | ICD-10-CM | POA: Diagnosis not present

## 2016-04-22 DIAGNOSIS — IMO0002 Reserved for concepts with insufficient information to code with codable children: Secondary | ICD-10-CM

## 2016-04-22 DIAGNOSIS — G43709 Chronic migraine without aura, not intractable, without status migrainosus: Secondary | ICD-10-CM

## 2016-04-22 DIAGNOSIS — I951 Orthostatic hypotension: Secondary | ICD-10-CM

## 2016-04-22 DIAGNOSIS — M5481 Occipital neuralgia: Secondary | ICD-10-CM

## 2016-04-22 DIAGNOSIS — G90A Postural orthostatic tachycardia syndrome (POTS): Secondary | ICD-10-CM

## 2016-04-22 DIAGNOSIS — R42 Dizziness and giddiness: Secondary | ICD-10-CM

## 2016-04-22 DIAGNOSIS — M797 Fibromyalgia: Secondary | ICD-10-CM | POA: Diagnosis not present

## 2016-04-22 DIAGNOSIS — G4709 Other insomnia: Secondary | ICD-10-CM

## 2016-04-22 DIAGNOSIS — M542 Cervicalgia: Secondary | ICD-10-CM | POA: Diagnosis not present

## 2016-04-22 DIAGNOSIS — R000 Tachycardia, unspecified: Secondary | ICD-10-CM

## 2016-04-22 MED ORDER — PREGABALIN 150 MG PO CAPS: 150.0000 mg | ORAL_CAPSULE | Freq: Two times a day (BID) | ORAL | 5 refills | Status: DC

## 2016-04-22 MED ORDER — FLUDROCORTISONE ACETATE 0.1 MG PO TABS: 0.1000 mg | ORAL_TABLET | Freq: Every day | ORAL | 5 refills | Status: DC

## 2016-04-22 MED ORDER — CYCLOBENZAPRINE HCL 5 MG PO TABS: 5.0000 mg | ORAL_TABLET | Freq: Three times a day (TID) | ORAL | 5 refills | Status: DC | PRN

## 2016-04-22 NOTE — Progress Notes (Signed)
[facility]  PATIENT: Cindy Turner DOB: [birth year]  REFERRING DOCTOR OR PCP:  Aura Dials SOURCE: Patient, ED notes, imaging reports, spine MRI images  was personally reviewed on PACS.  _________________________________   HISTORICAL  CHIEF COMPLAINT:  Chief Complaint  Patient presents with  . Fibromyalgia    Sts. h/a's have come and gone, slight h/a behind right eye today.  Sts. presyncope contines to be improved with Fludrocortisone.  Has been out of Lyrica since December, would like to discuss restarting/fim  . Migraines  . Presyncope    HISTORY OF PRESENT ILLNESS:  Cindy Turner is a 31 y.o. woman with headaches and dizziness.  HA/neck pain:    She is better than last year but she is still having about 5 - 6 migraines lasting 1-2 days.    Pain is now always on the right.   She has some occipital pain,   Pain is throbbing.   Bright lights and moving worsens the pain.  Nothing really triggers the pain when they occur.   No change in vision.  She has 30/30 days pain in the right neck/occiput, though sometimes mild, even when HA is notpreent   MRI's of her spine were normal.    FMS:   Increasing the Lyrica to 100 mg po bid helped her FMS pain.   She tolerates it well.  When present, pain is R > L and mostly near the spine in the upper body and in the right lower back down to the legs lower down.   Bending her ankles sometimes increase the leg pain.  Lifting increases her pain.     Presyncope:   She has had orthostatic presyncope sometimes associated with tachycardia .   She is much better on fludrocortisone and now rarely has a mild presyncope when standing up.      Insomnia:   She has been sleeping better with bedtime flexeril.   If she misses the dose, she is more tired and achy the next day.      HA History:  She reports a history of headaches since childhood. For many years the headaches were occurring 2-3 times a month and would last 1-2  days. With the headache she will get nausea, photophobia and phonophobia. Moving the head makes the pain worse. The headaches have been worsening this year. Currently she has a headache that has lasted one week. This one is associated with pain entirely on the right side that is throbbing at times. The headache started in the 4 head then went to the occiput. It intensified quite a bit at that point and shot up and down. She has taken over-the-counter anti-inflammatory medications. Additionally she went to the emergency room last week and was given Valium and oxycodone (more for her back). Those only helped her for short period of time. She has never tried a triptan. She has never been on prophylactic medications for the headaches.  Presyncope History:  Since 2016, she has had many episodes of lightheadedness and even one episode of syncope. These episodes fluctuate quite a bit and some months have been much worse than others. 3, and these are occurring she will stand up and get lightheaded she often has to get back down to a chair because she doesn't feel safe if she would stand up for longer period of time. One time she actually passed out. Most of the time, the spells occur while she is standing but sometimes she will get a lightheaded spell  while laying down and feel very numb.   She reports having a Holter monitor for a week. There was variation in her heart rate by her report but I do not have the official cardiology interpretation [facility]).   She notes some swelling in her feet, right greater than left. Sometimes the whole right leg will be more swollen.     REVIEW OF SYSTEMS: Constitutional: No fevers, chills, sweats, or change in appetite.   She notes Eyes: No visual changes, double vision, eye pain Ear, nose and throat: No hearing loss, ear pain, nasal congestion, sore throat Cardiovascular: No chest pain, palpitations Respiratory: No shortness of breath at rest or with exertion.    No wheezes GastrointestinaI: No nausea, vomiting, diarrhea, abdominal pain, fecal incontinence Genitourinary: No dysuria, urinary retention or frequency.  No nocturia. Musculoskeletal: as above Integumentary: No rash, pruritus, skin lesions Neurological: as above Psychiatric: No depression at this time.  No anxiety Endocrine: No palpitations, diaphoresis, change in appetite, change in weigh or increased thirst Hematologic/Lymphatic: No anemia, purpura, petechiae. Allergic/Immunologic: No itchy/runny eyes, nasal congestion, recent allergic reactions, rashes  ALLERGIES: Allergies  Allergen Reactions  . Terbutaline Sulfate     arrythmia  . Vitamin A     Headaches   . Latex Rash    HOME MEDICATIONS:  Current Outpatient Prescriptions:  .  B-Complex-C TABS, Take 1 tablet by mouth daily., Disp: , Rfl:  .  cyclobenzaprine (FLEXERIL) 5 MG tablet, Take 1 tablet (5 mg total) by mouth every 8 (eight) hours as needed for muscle spasms., Disp: 30 tablet, Rfl: 5 .  fludrocortisone (FLORINEF) 0.1 MG tablet, Take 1 tablet (0.1 mg total) by mouth daily., Disp: 30 tablet, Rfl: 5 .  Ginkgo Biloba (GNP GINGKO BILOBA EXTRACT PO), Take 1 tablet by mouth daily., Disp: , Rfl:  .  Turmeric 500 MG CAPS, Take 1 capsule by mouth daily., Disp: , Rfl:  .  vitamin C (ASCORBIC ACID) 500 MG tablet, Take 500 mg by mouth daily as needed., Disp: , Rfl:  .  pregabalin (LYRICA) 150 MG capsule, Take 1 capsule (150 mg total) by mouth 2 (two) times daily., Disp: 60 capsule, Rfl: 5  PAST MEDICAL HISTORY: Past Medical History:  Diagnosis Date  . Allergy   . Fibromyalgia   . Migraines   . Raynaud's disease   . Ureterolithiasis     PAST SURGICAL HISTORY: Past Surgical History:  Procedure Laterality Date  . APPENDECTOMY    . BREAST BIOPSY    . DILATION AND CURETTAGE OF UTERUS    . kidney stent    . LAPAROSCOPY    . TONSILLECTOMY    . TUBAL LIGATION    . TUBAL LIGATION      FAMILY HISTORY: Family History    Problem Relation Age of Onset  . Hypertension Mother   . Arthritis Mother   . Breast cancer Mother 30  . Uterine cancer Mother   . Legg-Calve-Perthes disease Father   . Healthy Brother   . Asthma Daughter   . Healthy Daughter   . Breast cancer Maternal Grandmother     dx in her 43s  . Throat cancer Maternal Grandfather     smoker; dx in 48s  . Melanoma Paternal Grandfather   . Hypertension Paternal Grandfather   . Diabetes Paternal Grandfather   . Heart attack Paternal Grandfather   . Melanoma Maternal Aunt     dx in her 63s    SOCIAL HISTORY:  Social History   Social  History  . Marital status: Married    Spouse name: N/A  . Number of children: N/A  . Years of education: N/A   Occupational History  . Not on file.   Social History Main Topics  . Smoking status: Never Smoker  . Smokeless tobacco: Never Used  . Alcohol use No  . Drug use: No  . Sexual activity: Yes    Partners: Male    Birth control/ protection: Surgical     Comment: tubal ligation   Other Topics Concern  . Not on file   Social History Narrative  . No narrative on file     PHYSICAL EXAM  Vitals:   04/22/16 0826  BP: 100/62  Pulse: 68  Resp: 16  Weight: 138 lb (62.6 kg)  Height: 5\' 7"  (1.702 m)    Body mass index is 21.61 kg/m.   No data found.     General: The patient is well-developed and well-nourished and in no acute distress  Neck: The neck is supple, no carotid bruits are noted.  The neck is tender at the right occiput and neck (lower cerv paraspinals, trapezius, rhomboids.   Neurologic Exam  Mental status: The patient is alert and oriented x 3 at the time of the examination. The patient has apparent normal recent and remote memory, with an apparently normal attention span and concentration ability.   Speech is normal.  Cranial nerves: Extraocular movements are full.  There is good facial sensation to soft touch bilaterally.Facial strength is normal.  Trapezius and  sternocleidomastoid strength is normal. No dysarthria is noted.  The tongue is midline, and the patient has symmetric elevation of the soft palate. No obvious hearing deficits are noted.  Motor:  Muscle bulk is normal.   Tone is normal. Strength is  5 / 5 in all 4 extremities.   Sensory: Sensory testing is intact to touch in all 4 extremities.  Coordination: Cerebellar testing reveals good finger-nose-finger  bilaterally.  Gait and station: Station is normal.   Gait is normal. Tandem gait is normal.    Reflexes: Deep tendon reflexes are symmetric and normal bilaterally.       DIAGNOSTIC DATA (LABS, IMAGING, TESTING) - I reviewed patient records, labs, notes, testing and imaging myself where available.  Lab Results  Component Value Date   WBC 4.8 11/14/2015   HGB 12.9 11/19/2015   HCT 38.0 11/19/2015   MCV 76.5 (L) 11/14/2015   PLT 204 11/14/2015      Component Value Date/Time   NA 141 11/19/2015 0243   K 3.2 (L) 11/19/2015 0243   CL 103 11/19/2015 0243   CO2 24 11/14/2015 2340   GLUCOSE 84 11/19/2015 0243   BUN 7 11/19/2015 0243   CREATININE 0.70 11/19/2015 0243   CALCIUM 9.4 11/14/2015 2340   PROT 7.1 10/23/2014 1933   ALBUMIN 4.0 10/23/2014 1933   AST 17 10/23/2014 1933   ALT 17 10/23/2014 1933   ALKPHOS 44 10/23/2014 1933   BILITOT 0.8 10/23/2014 1933   GFRNONAA >60 11/14/2015 2340   GFRAA >60 11/14/2015 2340    Lab Results  Component Value Date   TSH 0.495 11/25/2015       ASSESSMENT AND PLAN  Fibromyalgia  Neck pain  Common migraine without intractability  Chronic migraine  Occipital neuralgia of right side  POTS (postural orthostatic tachycardia syndrome)   1.   I will get her back on Lyrica for fibromyalgia at 150 mg po bid dose.   Also she will  take Flexeril at night to help the insomnia and hopefully help fibromyagia pain the next day.   If pain worsens, consider addition of duloxetine. 2.   For suspected POTS, she will continue low-dose  fludrocortisone. 3.   Trigger point injections into right splenius capitus, C6 and C7 paraspinal, trapezius and rhomboids muscles using 80 mg Depo-Medrol and sterile technique.   She tolerated the injections well and there were no complications. Pain was better afterwards.  4.   She will return to see me in 4 months or sooner if there are new or worsening neurologic symptoms.  Richard A. Felecia Shelling, MD, PhD 5/40/0867, 6:19 AM Certified in Neurology, Clinical Neurophysiology, Sleep Medicine, Pain Medicine and Neuroimaging  [facility] [address], [place], [place] [address] [phone]   `

## 2016-05-12 DIAGNOSIS — J029 Acute pharyngitis, unspecified: Secondary | ICD-10-CM | POA: Diagnosis not present

## 2016-05-12 DIAGNOSIS — R05 Cough: Secondary | ICD-10-CM | POA: Diagnosis not present

## 2016-05-12 DIAGNOSIS — J189 Pneumonia, unspecified organism: Secondary | ICD-10-CM | POA: Diagnosis not present

## 2016-05-12 DIAGNOSIS — J984 Other disorders of lung: Secondary | ICD-10-CM | POA: Diagnosis not present

## 2016-05-12 DIAGNOSIS — R509 Fever, unspecified: Secondary | ICD-10-CM | POA: Diagnosis not present

## 2016-07-07 ENCOUNTER — Emergency Department (HOSPITAL_BASED_OUTPATIENT_CLINIC_OR_DEPARTMENT_OTHER): Payer: Commercial Managed Care - HMO

## 2016-07-07 ENCOUNTER — Encounter (HOSPITAL_BASED_OUTPATIENT_CLINIC_OR_DEPARTMENT_OTHER): Payer: Self-pay | Admitting: *Deleted

## 2016-07-07 ENCOUNTER — Emergency Department (HOSPITAL_BASED_OUTPATIENT_CLINIC_OR_DEPARTMENT_OTHER)
Admission: EM | Admit: 2016-07-07 | Discharge: 2016-07-08 | Disposition: A | Discharge status: Home / Self Care | Payer: Commercial Managed Care - HMO | Attending: Emergency Medicine | Admitting: Emergency Medicine

## 2016-07-07 DIAGNOSIS — R0789 Other chest pain: Secondary | ICD-10-CM | POA: Diagnosis not present

## 2016-07-07 DIAGNOSIS — Z91040 Latex allergy status: Secondary | ICD-10-CM | POA: Insufficient documentation

## 2016-07-07 DIAGNOSIS — M542 Cervicalgia: Secondary | ICD-10-CM

## 2016-07-07 DIAGNOSIS — M25551 Pain in right hip: Secondary | ICD-10-CM

## 2016-07-07 DIAGNOSIS — Y999 Unspecified external cause status: Secondary | ICD-10-CM | POA: Diagnosis not present

## 2016-07-07 DIAGNOSIS — Y929 Unspecified place or not applicable: Secondary | ICD-10-CM | POA: Diagnosis not present

## 2016-07-07 DIAGNOSIS — Y9301 Activity, walking, marching and hiking: Secondary | ICD-10-CM | POA: Diagnosis not present

## 2016-07-07 DIAGNOSIS — W19XXXA Unspecified fall, initial encounter: Secondary | ICD-10-CM | POA: Insufficient documentation

## 2016-07-07 DIAGNOSIS — S199XXA Unspecified injury of neck, initial encounter: Secondary | ICD-10-CM | POA: Diagnosis not present

## 2016-07-07 DIAGNOSIS — M545 Low back pain, unspecified: Secondary | ICD-10-CM

## 2016-07-07 DIAGNOSIS — S069X9A Unspecified intracranial injury with loss of consciousness of unspecified duration, initial encounter: Secondary | ICD-10-CM | POA: Diagnosis present

## 2016-07-07 LAB — PREGNANCY, URINE: Preg Test, Ur: NEGATIVE

## 2016-07-07 MED ORDER — IBUPROFEN 400 MG PO TABS
600.0000 mg | ORAL_TABLET | Freq: Once | ORAL | Status: AC
  Administered 2016-07-07: 600 mg via ORAL
  Filled 2016-07-07: qty 1

## 2016-07-07 MED ORDER — ACETAMINOPHEN 325 MG PO TABS
650.0000 mg | ORAL_TABLET | Freq: Once | ORAL | Status: AC
  Administered 2016-07-07: 650 mg via ORAL
  Filled 2016-07-07: qty 2

## 2016-07-07 NOTE — ED Triage Notes (Signed)
C/o fall x 4 days ago with continuing right flank paina and lower back pain

## 2016-07-07 NOTE — ED Provider Notes (Signed)
[facility] Provider Note   CSN: [id number] Arrival date & time: 07/07/16  2039  By signing my name below, I, Levester Fresh, attest that this documentation has been prepared under the direction and in the presence of Jola Schmidt, MD . Electronically Signed: Levester Fresh, Scribe. 07/07/2016. 9:43 PM.  History   Chief Complaint Chief Complaint  Patient presents with  . Fall    HPI Comments XOE HOE is a 31 y.o. female with a PMHx significant for POTS and fibromyalgia, who presents to the Emergency Department with multiple sudden onset complaints s/p a mechanical fall x4 days ago. Pt was chasing her family goat in the dark when she tripped and fell onto the concrete, striking her head on the way down.  Reports LOC. She additionally endorses a headache, in addition to pain in her neck, chest wall, right hip, right lower flank and right lower extremity.  Some intermittent right leg numbness. No obvious wounds or active bleeding. She is able to move her knee.  Sensation intact to feet.  Sx not relieved by flexeril.  Pt has not attempted to treat her sx with any OTC medications. She denies experiencing any other acute sx, including abdominal pain or shortness of breath. Pt is able to ambulate without difficulty in the ED.   The history is provided by the patient and medical records. No language interpreter was used.   Past Medical History:  Diagnosis Date  . Allergy   . Fibromyalgia   . Migraines   . Raynaud's disease   . Ureterolithiasis    Patient Active Problem List   Diagnosis Date Noted  . Chronic migraine 04/22/2016  . Dysuria 01/16/2016  . Acute bilateral low back pain 01/16/2016  . Painless hematuria 01/16/2016  . Irregular periods 01/16/2016  . Right shoulder injury- ortho 12/25/2015  . Family history of diabetes mellitus 12/25/2015  . OCD (obsessive compulsive disorder) 12/25/2015  . Adjustment disorder with mixed anxiety and depressed mood  12/25/2015  . Family history of breast cancer in first degree relative- mother age 97 12/25/2015  . Family history of uterine cancer- Mom age 38 12/25/2015  . Family history of malignant melanoma of skin- in 30's 12/25/2015  . Common migraine without intractability 11/25/2015  . Occipital neuralgia of right side 11/25/2015  . Neck pain 11/25/2015  . POTS (postural orthostatic tachycardia syndrome) 11/25/2015  . Lightheadedness 11/25/2015  . Insomnia 11/25/2015  . Periodic limb movement disorder (PLMD) 11/25/2015  . Right wrist injury and R shoulder- injured at work- ortho 09/11/2015  . 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 Fibromyalgia 05/16/2011  . Abdominal pain, acute, right upper quadrant 09/01/2010   Past Surgical History:  Procedure Laterality Date  . APPENDECTOMY    . BREAST BIOPSY    . DILATION AND CURETTAGE OF UTERUS    . kidney stent    . LAPAROSCOPY    . TONSILLECTOMY    . TUBAL LIGATION    . TUBAL LIGATION     OB History    Gravida Para Term Preterm AB Living   3 2 2   1 3    SAB TAB Ectopic Multiple Live Births   1             Home Medications    Prior to Admission medications   Medication Sig Start Date End Date Taking? Authorizing Provider  B-Complex-C TABS Take 1 tablet by mouth daily.    [provider]  cyclobenzaprine (FLEXERIL) 5 MG tablet  Take 1 tablet (5 mg total) by mouth every 8 (eight) hours as needed for muscle spasms. 04/22/16   Sater, Nanine Means, MD  fludrocortisone (FLORINEF) 0.1 MG tablet Take 1 tablet (0.1 mg total) by mouth daily. 04/22/16   Sater, Nanine Means, MD  Ginkgo Biloba (GNP GINGKO BILOBA EXTRACT PO) Take 1 tablet by mouth daily.    [provider]  Turmeric 500 MG CAPS Take 1 capsule by mouth daily.    [provider]  vitamin C (ASCORBIC ACID) 500 MG tablet Take 500 mg by mouth daily as needed.    [provider]   Family History Family History  Problem Relation Age of Onset  . Hypertension Mother    . Arthritis Mother   . Breast cancer Mother 37  . Uterine cancer Mother   . Legg-Calve-Perthes disease Father   . Healthy Brother   . Asthma Daughter   . Healthy Daughter   . Breast cancer Maternal Grandmother        dx in her 53s  . Throat cancer Maternal Grandfather        smoker; dx in 81s  . Melanoma Paternal Grandfather   . Hypertension Paternal Grandfather   . Diabetes Paternal Grandfather   . Heart attack Paternal Grandfather   . Melanoma Maternal Aunt        dx in her 11s    Social History Social History  Substance Use Topics  . Smoking status: Never Smoker  . Smokeless tobacco: Never Used  . Alcohol use No     Allergies   Terbutaline sulfate; Vitamin a; and Latex   Review of Systems Review of Systems   All systems reviewed and are negative for acute change except as noted in the HPI.  Physical Exam Updated Vital Signs BP 135/84   Pulse 70   Temp 98.4 F (36.9 C) (Oral)   Resp 16   Ht 5\' 7"  (1.702 m)   Wt 134 lb (60.8 kg)   LMP 07/06/2016   SpO2 100%   BMI 20.99 kg/m   Physical Exam  Constitutional: She is oriented to person, place, and time. She appears well-developed and well-nourished. No distress.  HENT:  Head: Normocephalic and atraumatic.  Eyes: EOM are normal.  Neck: Normal range of motion.  Mild para-cervical tenderness.  Cardiovascular: Normal rate, regular rhythm and normal heart sounds.   Pulmonary/Chest: Effort normal and breath sounds normal.  Right lateral chest wall tenderness without crepitus.  Abdominal: Soft. She exhibits no distension. There is no tenderness.  Musculoskeletal: Normal range of motion. She exhibits no deformity.  Lumbar and para-lumbar tenderness. Tenderness to right iliac crest. FROM of right hip and right knee.   Neurological: She is alert and oriented to person, place, and time.  Skin: Skin is warm and dry.  Psychiatric: She has a normal mood and affect. Judgment normal.  Nursing note and vitals  reviewed.  ED Treatments / Results  DIAGNOSTIC STUDIES: Oxygen Saturation is 100% on room air, normal by my interpretation.     COORDINATION OF CARE: 9:05 PM Discussed treatment plan with pt at bedside and pt agreed to plan.  Labs (all labs ordered are listed, but only abnormal results are displayed) Labs Reviewed  PREGNANCY, URINE    EKG  EKG Interpretation None       Radiology No results found.  Procedures Procedures (including critical care time)  Medications Ordered in ED Medications - No data to display   Initial Impression / Assessment and  Plan / ED Course  I have reviewed the triage vital signs and the nursing notes.  Pertinent labs & imaging results that were available during my care of the patient were reviewed by me and considered in my medical decision making (see chart for details).     Patient is overall well-appearing.  Discharge home in good condition.  Likely all contusions.  I personally performed the services described in this documentation, which was scribed in my presence. The recorded information has been reviewed and is accurate.   Final Clinical Impressions(s) / ED Diagnoses   Final diagnoses:  Fall, initial encounter  Neck pain  Acute right-sided low back pain without sciatica  Right hip pain    New Prescriptions New Prescriptions   No medications on file     Jola Schmidt, MD 07/08/16 0003

## 2016-07-07 NOTE — ED Notes (Signed)
Returned from [place] and X-Ray

## 2016-07-07 NOTE — ED Notes (Signed)
Patient transported to CT 

## 2016-07-07 NOTE — ED Notes (Signed)
Patient transported to X-ray 

## 2016-07-09 ENCOUNTER — Encounter: Payer: Self-pay | Admitting: Family Medicine

## 2016-07-09 ENCOUNTER — Ambulatory Visit (INDEPENDENT_AMBULATORY_CARE_PROVIDER_SITE_OTHER): Payer: Commercial Managed Care - HMO | Admitting: Adult Health

## 2016-07-09 ENCOUNTER — Encounter: Payer: Self-pay | Admitting: Adult Health

## 2016-07-09 VITALS — BP 99/69 | HR 63 | Ht 67.0 in | Wt 136.0 lb

## 2016-07-09 DIAGNOSIS — R42 Dizziness and giddiness: Secondary | ICD-10-CM

## 2016-07-09 DIAGNOSIS — S20211A Contusion of right front wall of thorax, initial encounter: Secondary | ICD-10-CM

## 2016-07-09 DIAGNOSIS — Z8249 Family history of ischemic heart disease and other diseases of the circulatory system: Secondary | ICD-10-CM

## 2016-07-09 DIAGNOSIS — S20211S Contusion of right front wall of thorax, sequela: Secondary | ICD-10-CM | POA: Insufficient documentation

## 2016-07-09 DIAGNOSIS — Z833 Family history of diabetes mellitus: Secondary | ICD-10-CM

## 2016-07-09 DIAGNOSIS — R1011 Right upper quadrant pain: Secondary | ICD-10-CM | POA: Diagnosis not present

## 2016-07-09 NOTE — Progress Notes (Signed)
Subjective:    Patient ID: Cindy Turner, female    DOB: Aug 21, 1985, 31 y.o.   MRN: [id number]  HPI:  Ms. Messerschmidt presents for f/u for fall suffered 07/04/16-she was chasing family goat and tripped (she was wearing flip flops). She struck her head although denies LOC (ED report states she did experience LOC-however she denies) and she hit neck/back/hips.  She was able to stand up and ambulate into house.  She rested in recliner that night and did not seek medical care until 07/07/16 at ED-please see EPIC notes. We reviewed all imaging reports at length. She reports that pain has not improved esp on anterior/posterior of R thorax.  She denies CP/palpitations, however feels fatigued and "sluggish". She denies blood in urine/stool or bladder dysfunction.  She reports not having BM since DOI, and she usually defecates daily.  She denies confusion/dizziness/memory or cognitive impairment.  She has not used Cyclobenzaprine or NSAIDs, instead been using ice/heat, rest, and essential oils.  Patient Care Team    Relationship Specialty Notifications Start End  Mellody Dance, DO PCP - General Family Medicine  12/25/15   Reche Dixon, PA-C Consulting Physician Orthopedic Surgery  12/25/15   Adrian Prows, MD Consulting Physician Cardiology  12/25/15   Britt Bottom, MD  Neurology  12/25/15     Patient Active Problem List   Diagnosis Date Noted  . Rib contusion, right, initial encounter 07/09/2016  . Chronic migraine 04/22/2016  . Dysuria 01/16/2016  . Acute bilateral low back pain 01/16/2016  . Painless hematuria 01/16/2016  . Irregular periods 01/16/2016  . Right shoulder injury- ortho 12/25/2015  . Family history of diabetes mellitus 12/25/2015  . OCD (obsessive compulsive disorder) 12/25/2015  . Adjustment disorder with mixed anxiety and depressed mood 12/25/2015  . Family history of breast cancer in first degree relative- mother age 3 12/25/2015  . Family history of uterine cancer-  Mom age 54 12/25/2015  . Family history of malignant melanoma of skin- in 30's 12/25/2015  . Common migraine without intractability 11/25/2015  . Occipital neuralgia of right side 11/25/2015  . Neck pain 11/25/2015  . POTS (postural orthostatic tachycardia syndrome) 11/25/2015  . Lightheadedness 11/25/2015  . Insomnia 11/25/2015  . Periodic limb movement disorder (PLMD) 11/25/2015  . Right wrist injury and R shoulder- injured at work- ortho 09/11/2015  . 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 Fibromyalgia 05/16/2011  . Abdominal pain, acute, right upper quadrant 09/01/2010     Past Medical History:  Diagnosis Date  . Allergy   . Fibromyalgia   . Migraines   . Raynaud's disease   . Ureterolithiasis      Past Surgical History:  Procedure Laterality Date  . APPENDECTOMY    . BREAST BIOPSY    . DILATION AND CURETTAGE OF UTERUS    . kidney stent    . LAPAROSCOPY    . TONSILLECTOMY    . TUBAL LIGATION    . TUBAL LIGATION       Family History  Problem Relation Age of Onset  . Hypertension Mother   . Arthritis Mother   . Breast cancer Mother 68  . Uterine cancer Mother   . Legg-Calve-Perthes disease Father   . Healthy Brother   . Asthma Daughter   . Healthy Daughter   . Breast cancer Maternal Grandmother        dx in her 47s  . Throat cancer Maternal Grandfather        smoker; dx in 69s  .  Melanoma Paternal Grandfather   . Hypertension Paternal Grandfather   . Diabetes Paternal Grandfather   . Heart attack Paternal Grandfather   . Melanoma Maternal Aunt        dx in her 92s     History  Drug Use No     History  Alcohol Use No     History  Smoking Status  . Never Smoker  Smokeless Tobacco  . Never Used     Outpatient Encounter Prescriptions as of 07/09/2016  Medication Sig  . B-Complex-C TABS Take 1 tablet by mouth daily.  . cyclobenzaprine (FLEXERIL) 5 MG tablet Take 1 tablet (5 mg total) by mouth every 8 (eight) hours as needed for muscle  spasms.  . cyclobenzaprine (FLEXERIL) 5 MG tablet Take 5 mg by mouth as needed.  . fludrocortisone (FLORINEF) 0.1 MG tablet Take 1 tablet (0.1 mg total) by mouth daily.  . Ginkgo Biloba (GNP GINGKO BILOBA EXTRACT PO) Take 1 tablet by mouth daily.  . Turmeric 500 MG CAPS Take 1 capsule by mouth daily.  . vitamin C (ASCORBIC ACID) 500 MG tablet Take 500 mg by mouth daily as needed.   No facility-administered encounter medications on file as of 07/09/2016.     Allergies: Terbutaline sulfate; Vitamin a; and Latex  Body mass index is 21.3 kg/m.  Blood pressure 99/69, pulse 63, height 5\' 7"  (1.702 m), weight 136 lb (61.7 kg), last menstrual period 07/06/2016, SpO2 100 %.   Review of Systems  Constitutional: Positive for activity change, appetite change and fatigue. Negative for chills, diaphoresis, fever and unexpected weight change.  Eyes: Negative for visual disturbance.  Respiratory: Negative for cough, chest tightness, shortness of breath, wheezing and stridor.   Cardiovascular: Negative for chest pain, palpitations and leg swelling.  Gastrointestinal: Positive for constipation. Negative for abdominal distention, abdominal pain, blood in stool, diarrhea, nausea and vomiting.  Endocrine: Negative for cold intolerance, heat intolerance, polydipsia, polyphagia and polyuria.  Genitourinary: Negative for difficulty urinating, flank pain, hematuria, pelvic pain and vaginal pain.  Musculoskeletal: Positive for arthralgias, back pain, gait problem, joint swelling and myalgias. Negative for neck pain and neck stiffness.  Skin: Negative for color change, pallor, rash and wound.  Allergic/Immunologic: Negative for immunocompromised state.  Neurological: Positive for headaches. Negative for dizziness, tremors, speech difficulty, weakness and light-headedness.  Hematological: Does not bruise/bleed easily.  Psychiatric/Behavioral: Negative for behavioral problems, confusion and sleep disturbance.        Objective:   Physical Exam  Constitutional: She is oriented to person, place, and time. She appears well-developed and well-nourished. No distress.  HENT:  Head: Normocephalic and atraumatic.  Right Ear: External ear normal.  Left Ear: External ear normal.  Eyes: Conjunctivae and EOM are normal. Pupils are equal, round, and reactive to light.  Neck: Normal range of motion. Neck supple.  Cardiovascular: Normal rate, regular rhythm, normal heart sounds and intact distal pulses.   No murmur heard. Pulmonary/Chest: Effort normal and breath sounds normal. No accessory muscle usage. No respiratory distress. She has no wheezes. She has no rales. She exhibits no tenderness.  Winces when deep breathing.  Abdominal: Soft. Bowel sounds are normal. She exhibits no distension and no mass. There is no tenderness. There is no rebound and no guarding.  Musculoskeletal: She exhibits tenderness. She exhibits no edema or deformity.       Right hip: She exhibits tenderness.       Left hip: She exhibits tenderness.       Cervical back:  She exhibits tenderness.       Thoracic back: She exhibits tenderness.       Lumbar back: She exhibits tenderness.  No ecchymosis noted anywhere.  Lymphadenopathy:    She has no cervical adenopathy.  Neurological: She is alert and oriented to person, place, and time. Coordination normal.  Skin: Skin is warm and dry. No rash noted. She is not diaphoretic. No erythema. No pallor.  Psychiatric: She has a normal mood and affect. Her behavior is normal. Judgment and thought content normal.  Nursing note and vitals reviewed.         Assessment & Plan:   1. Rib contusion, right, initial encounter     Rib contusion, right, initial encounter Please use Cyclobenzaprine as directed. Recommend OTC Ibuprofen 844m every 8 hrs as needed for pain/swelling. Apply ice to ribs for 45mins several times daily. REST-work excuse provided. Please call clinic with any  questions/concerns-reviewed Red Flag sx's. Follow-up in one week, sooner if needed.    FOLLOW-UP:  Return in about 1 week (around 07/16/2016) for Regular Follow Up.

## 2016-07-09 NOTE — Assessment & Plan Note (Signed)
Please use Cyclobenzaprine as directed. Recommend OTC Ibuprofen 865m every 8 hrs as needed for pain/swelling. Apply ice to ribs for 56mins several times daily. REST-work excuse provided. Please call clinic with any questions/concerns-reviewed Red Flag sx's. Follow-up in one week, sooner if needed.

## 2016-07-09 NOTE — Addendum Note (Signed)
Addended by: Fonnie Mu on: 07/09/2016 03:07 PM   Modules accepted: Orders

## 2016-07-09 NOTE — Patient Instructions (Signed)
Rib Contusion A rib contusion is a deep bruise on your rib area. Contusions are the result of a blunt trauma that causes bleeding and injury to the tissues under the skin. A rib contusion may involve bruising of the ribs and of the skin and muscles in the area. The skin overlying the contusion may turn blue, purple, or yellow. Minor injuries will give you a painless contusion, but more severe contusions may stay painful and swollen for a few weeks. What are the causes? A contusion is usually caused by a blow, trauma, or direct force to an area of the body. This often occurs while playing contact sports. What are the signs or symptoms?  Swelling and redness of the injured area.  Discoloration of the injured area.  Tenderness and soreness of the injured area.  Pain with or without movement. How is this diagnosed? The diagnosis can be made by taking a medical history and performing a physical exam. An X-ray, CT scan, or MRI may be needed to determine if there were any associated injuries, such as broken bones (fractures) or internal injuries. How is this treated? Often, the best treatment for a rib contusion is rest. Icing or applying cold compresses to the injured area may help reduce swelling and inflammation. Deep breathing exercises may be recommended to reduce the risk of partial lung collapse and pneumonia. Over-the-counter or prescription medicines may also be recommended for pain control. Follow these instructions at home:  Apply ice to the injured area: ? Put ice in a plastic bag. ? Place a towel between your skin and the bag. ? Leave the ice on for 20 minutes, 2-3 times per day.  Take medicines only as directed by your health care provider.  Rest the injured area. Avoid strenuous activity and any activities or movements that cause pain. Be careful during activities and avoid bumping the injured area.  Perform deep-breathing exercises as directed by your health care provider.  Do  not lift anything that is heavier than 5 lb (2.3 kg) until your health care provider approves.  Do not use any tobacco products, including cigarettes, chewing tobacco, or electronic cigarettes. If you need help quitting, ask your health care provider. Contact a health care provider if:  You have increased bruising or swelling.  You have pain that is not controlled with treatment.  You have a fever. Get help right away if:  You have difficulty breathing or shortness of breath.  You develop a continual cough, or you cough up thick or bloody sputum.  You feel sick to your stomach (nauseous), you throw up (vomit), or you have abdominal pain. This information is not intended to replace advice given to you by your health care provider. Make sure you discuss any questions you have with your health care provider. Document Released: 10/21/2000 Document Revised: 07/04/2015 Document Reviewed: 11/07/2013 Elsevier Interactive Patient Education  2018 [employer].  Please use Cyclobenzaprine as directed. Recommend OTC Ibuprofen 863m every 8 hrs as needed for pain/swelling. Please call clinic with any questions/concerns. Follow-up in one week, sooner if needed.

## 2016-07-10 LAB — CBC WITH DIFFERENTIAL/PLATELET
BASOS: 1 %
Basophils Absolute: 0.1 10*3/uL (ref 0.0–0.2)
EOS (ABSOLUTE): 0.1 10*3/uL (ref 0.0–0.4)
EOS: 2 %
Hematocrit: 41.2 % (ref 34.0–46.6)
Hemoglobin: 13.5 g/dL (ref 11.1–15.9)
IMMATURE GRANS (ABS): 0 10*3/uL (ref 0.0–0.1)
IMMATURE GRANULOCYTES: 0 %
LYMPHS: 19 %
Lymphocytes Absolute: 1.1 10*3/uL (ref 0.7–3.1)
MCH: 25.2 pg — ABNORMAL LOW (ref 26.6–33.0)
MCHC: 32.8 g/dL (ref 31.5–35.7)
MCV: 77 fL — AB (ref 79–97)
MONOCYTES: 9 %
Monocytes Absolute: 0.5 10*3/uL (ref 0.1–0.9)
NEUTROS ABS: 3.8 10*3/uL (ref 1.4–7.0)
NEUTROS PCT: 69 %
PLATELETS: 244 10*3/uL (ref 150–379)
RBC: 5.35 x10E6/uL — ABNORMAL HIGH (ref 3.77–5.28)
RDW: 15.1 % (ref 12.3–15.4)
WBC: 5.6 10*3/uL (ref 3.4–10.8)

## 2016-07-10 LAB — LIPID PANEL
CHOLESTEROL TOTAL: 125 mg/dL (ref 100–199)
Chol/HDL Ratio: 2.1 ratio (ref 0.0–4.4)
HDL: 60 mg/dL (ref >39)
LDL CALC: 56 mg/dL (ref 0–99)
TRIGLYCERIDES: 44 mg/dL (ref 0–149)
VLDL Cholesterol Cal: 9 mg/dL (ref 5–40)

## 2016-07-10 LAB — COMPREHENSIVE METABOLIC PANEL
A/G RATIO: 1.8 (ref 1.2–2.2)
ALT: 15 IU/L (ref 0–32)
AST: 16 IU/L (ref 0–40)
Albumin: 4.9 g/dL (ref 3.5–5.5)
Alkaline Phosphatase: 54 IU/L (ref 39–117)
BUN / CREAT RATIO: 18 (ref 9–23)
BUN: 12 mg/dL (ref 6–20)
Bilirubin Total: 1.4 mg/dL — ABNORMAL HIGH (ref 0.0–1.2)
CALCIUM: 9.8 mg/dL (ref 8.7–10.2)
CO2: 22 mmol/L (ref 18–29)
Chloride: 102 mmol/L (ref 96–106)
Creatinine, Ser: 0.65 mg/dL (ref 0.57–1.00)
GFR, EST AFRICAN AMERICAN: 136 mL/min/{1.73_m2} (ref >59)
GFR, EST NON AFRICAN AMERICAN: 118 mL/min/{1.73_m2} (ref >59)
Globulin, Total: 2.8 g/dL (ref 1.5–4.5)
Glucose: 78 mg/dL (ref 65–99)
POTASSIUM: 4.6 mmol/L (ref 3.5–5.2)
Sodium: 138 mmol/L (ref 134–144)
TOTAL PROTEIN: 7.7 g/dL (ref 6.0–8.5)

## 2016-07-10 LAB — HEMOGLOBIN A1C
Est. average glucose Bld gHb Est-mCnc: 103 mg/dL
Hgb A1c MFr Bld: 5.2 % (ref 4.8–5.6)

## 2016-07-10 LAB — T4, FREE: Free T4: 1.49 ng/dL (ref 0.82–1.77)

## 2016-07-10 LAB — TSH: TSH: 0.802 u[IU]/mL (ref 0.450–4.500)

## 2016-07-10 LAB — VITAMIN D 25 HYDROXY (VIT D DEFICIENCY, FRACTURES): Vit D, 25-Hydroxy: 38.7 ng/mL (ref 30.0–100.0)

## 2016-07-16 ENCOUNTER — Ambulatory Visit
Admission: RE | Admit: 2016-07-16 | Discharge: 2016-07-16 | Disposition: A | Discharge status: Home / Self Care | Payer: Commercial Managed Care - HMO | Source: Ambulatory Visit | Attending: Adult Health | Admitting: Adult Health

## 2016-07-16 ENCOUNTER — Ambulatory Visit (INDEPENDENT_AMBULATORY_CARE_PROVIDER_SITE_OTHER): Payer: Commercial Managed Care - HMO | Admitting: Neurology

## 2016-07-16 ENCOUNTER — Ambulatory Visit (INDEPENDENT_AMBULATORY_CARE_PROVIDER_SITE_OTHER): Payer: Commercial Managed Care - HMO | Admitting: Adult Health

## 2016-07-16 ENCOUNTER — Encounter: Payer: Self-pay | Admitting: Adult Health

## 2016-07-16 ENCOUNTER — Telehealth: Payer: Self-pay | Admitting: Neurology

## 2016-07-16 ENCOUNTER — Encounter: Payer: Self-pay | Admitting: Neurology

## 2016-07-16 VITALS — BP 113/71 | HR 73 | Resp 16 | Ht 67.0 in | Wt 135.0 lb

## 2016-07-16 VITALS — BP 99/64 | HR 87 | Ht 67.0 in | Wt 134.1 lb

## 2016-07-16 DIAGNOSIS — G4761 Periodic limb movement disorder: Secondary | ICD-10-CM | POA: Diagnosis not present

## 2016-07-16 DIAGNOSIS — R0781 Pleurodynia: Secondary | ICD-10-CM | POA: Diagnosis not present

## 2016-07-16 DIAGNOSIS — G43709 Chronic migraine without aura, not intractable, without status migrainosus: Secondary | ICD-10-CM

## 2016-07-16 DIAGNOSIS — R251 Tremor, unspecified: Secondary | ICD-10-CM

## 2016-07-16 DIAGNOSIS — S0990XA Unspecified injury of head, initial encounter: Secondary | ICD-10-CM

## 2016-07-16 DIAGNOSIS — R55 Syncope and collapse: Secondary | ICD-10-CM | POA: Diagnosis not present

## 2016-07-16 DIAGNOSIS — S20211S Contusion of right front wall of thorax, sequela: Secondary | ICD-10-CM

## 2016-07-16 DIAGNOSIS — M542 Cervicalgia: Secondary | ICD-10-CM | POA: Diagnosis not present

## 2016-07-16 DIAGNOSIS — I951 Orthostatic hypotension: Secondary | ICD-10-CM

## 2016-07-16 DIAGNOSIS — S20211A Contusion of right front wall of thorax, initial encounter: Secondary | ICD-10-CM

## 2016-07-16 DIAGNOSIS — R000 Tachycardia, unspecified: Secondary | ICD-10-CM | POA: Diagnosis not present

## 2016-07-16 DIAGNOSIS — IMO0002 Reserved for concepts with insufficient information to code with codable children: Secondary | ICD-10-CM

## 2016-07-16 DIAGNOSIS — G90A Postural orthostatic tachycardia syndrome (POTS): Secondary | ICD-10-CM

## 2016-07-16 MED ORDER — IBUPROFEN 200 MG PO TABS: 600.0000 mg | ORAL_TABLET | Freq: Once | ORAL | 0 refills | Status: DC

## 2016-07-16 MED ORDER — FLUDROCORTISONE ACETATE 0.1 MG PO TABS: 0.1000 mg | ORAL_TABLET | Freq: Every day | ORAL | 11 refills | Status: DC

## 2016-07-16 MED ORDER — IBUPROFEN 200 MG PO TABS
600.0000 mg | ORAL_TABLET | Freq: Once | ORAL | Status: AC
  Administered 2016-07-16: 600 mg via ORAL

## 2016-07-16 NOTE — Telephone Encounter (Signed)
I have spoken with Cindy Turner this morning.  She sts. she fell over the weekend, ever since has  had daily h/a, dizziness, feels as if she is going to pass out.  No actual syncope.  Appt. given this afternoon/fim

## 2016-07-16 NOTE — Telephone Encounter (Signed)
Patient called office in reference to recent fall Saturday May 25th resulting in hitting her head and fractured her ribs.  Patient is now having increased symptoms of shaking, tremors, passing out spells and increased migraines.  Patient's PCP requesting patient to be seen soon with Dr. Felecia Shelling to be sure patient is doing okay.  Please call

## 2016-07-16 NOTE — Assessment & Plan Note (Deleted)
Still experiencing pain R/L ribs, difficult to deep breath. Ordered repeat CXR. Continue with cyclobenzaprine and OTC NSAIDs. Continue rest.

## 2016-07-16 NOTE — Patient Instructions (Signed)
Chest Wall Pain Chest wall pain is pain in or around the bones and muscles of your chest. Sometimes, an injury causes this pain. Sometimes, the cause may not be known. This pain may take several weeks or longer to get better. Follow these instructions at home: Pay attention to any changes in your symptoms. Take these actions to help with your pain:  Rest as told by your health care provider.  Avoid activities that cause pain. These include any activities that use your chest muscles or your abdominal and side muscles to lift heavy items.  If directed, apply ice to the painful area: ? Put ice in a plastic bag. ? Place a towel between your skin and the bag. ? Leave the ice on for 20 minutes, 2-3 times per day.  Take over-the-counter and prescription medicines only as told by your health care provider.  Do not use tobacco products, including cigarettes, chewing tobacco, and e-cigarettes. If you need help quitting, ask your health care provider.  Keep all follow-up visits as told by your health care provider. This is important.  Contact a health care provider if:  You have a fever.  Your chest pain becomes worse.  You have new symptoms. Get help right away if:  You have nausea or vomiting.  You feel sweaty or light-headed.  You have a cough with phlegm (sputum) or you cough up blood.  You develop shortness of breath. This information is not intended to replace advice given to you by your health care provider. Make sure you discuss any questions you have with your health care provider. Document Released: 01/26/2005 Document Revised: 06/06/2015 Document Reviewed: 04/23/2014 Elsevier Interactive Patient Education  2017 [employer]  Tremor A tremor is trembling or shaking that you cannot control. Most tremors affect the hands or arms. Tremors can also affect the head, vocal cords, face, and other parts of the body. There are many types of tremors. Common types include:  Essential  tremor. These usually occur in people over the age of 69. It may run in families and can happen in otherwise healthy people.  Resting tremor. These occur when the muscles are at rest, such as when your hands are resting in your lap. People with Parkinson disease often have resting tremors.  Postural tremor. These occur when you try to hold a pose, such as keeping your hands outstretched.  Kinetic tremor. These occur during purposeful movement, such as trying to touch a finger to your nose.  Task-specific tremor. These may occur when you perform tasks such as handwriting, speaking, or standing.  Psychogenic tremor. These dramatically lessen or disappear when you are distracted. They can happen in people of all ages.  Some types of tremors have no known cause. Tremors can also be a symptom of nervous system problems (neurological disorders) that may occur with aging. Some tremors go away with treatment while others do not. Follow these instructions at home: Watch your tremor for any changes. The following actions may help to lessen any discomfort you are feeling:  Take medicines only as directed by your health care provider.  Limit alcohol intake to no more than 1 drink per day for nonpregnant women and 2 drinks per day for men. One drink equals 12 oz of beer, 5 oz of wine, or 1 oz of hard liquor.  Do not use any tobacco products, including cigarettes, chewing tobacco, or electronic cigarettes. If you need help quitting, ask your health care provider.  Avoid extreme heat or cold.  Limit the amount of caffeine you consumeas directed by your health care provider.  Try to get 8 hours of sleep each night.  Find ways to manage your stress, such as meditation or yoga.  Keep all follow-up visits as directed by your health care provider. This is important.  Contact a health care provider if:  You start having a tremor after starting a new medicine.  You have tremor with other symptoms  such as: ? Numbness. ? Tingling. ? Pain. ? Weakness.  Your tremor gets worse.  Your tremor interferes with your day-to-day life. This information is not intended to replace advice given to you by your health care provider. Make sure you discuss any questions you have with your health care provider. Document Released: 01/16/2002 Document Revised: 09/29/2015 Document Reviewed: 07/24/2013 Elsevier Interactive Patient Education  2018 [employer].  Continue nightly Cyclobenzaprine and OTC Ibuprofen as needed. Continue to rest and eat a well balanced diet. Chest Xray order placed-we will call when results are available. Please see your Neurologist ASAP-reason: increased tremor and dizziness. Please call with any questions/concerns.

## 2016-07-16 NOTE — Assessment & Plan Note (Signed)
Still experiencing pain R/L ribs, difficult to deep breath. Ordered repeat CXR. Continue with cyclobenzaprine and OTC NSAIDs. Continue rest.

## 2016-07-16 NOTE — Progress Notes (Signed)
[facility]  PATIENT: Cindy Turner DOB: 07-20-1985  REFERRING DOCTOR OR PCP:  Aura Dials SOURCE: Patient, ED notes, imaging reports, spine MRI images  was personally reviewed on PACS.  _________________________________   HISTORICAL  CHIEF COMPLAINT:  Chief Complaint  Patient presents with  . Headache    Sts. she fell 07/04/16--chasing her goat--slipped on wet road--hit back of her head.  No immed. loc but sts. she had complete loc about 30 min. later. "But I wasn't out very long."   She saw her pcp and the following week had a CT.  Sts. she continues to have daily h/a, dizziness.  Sts. she also has fx. left 9th rib, also from fall/fim  . Dizziness    HISTORY OF PRESENT ILLNESS:  Cindy Turner is a 31 y.o. woman with POTS and FMS.  Syncope:   She fell chasing her goat and hit the back of her head.  Of note, she felt dizzy before her fall.  She did not lose consciousness at the time.  However, 30 minutes later she had LOC for just a few seconds (while sitting indoors).   She had lightheadedness followed by tingling and blackening of her vision before she had syncope.   There was no GTC, tongue biting or loss of urine.    She has had multiple episodes of presyncope and occasional episodes of syncope.     In the past she had orthostatic hypotension and tachycardia. This was associated with some episodes of presyncope and syncope.   Fludrocortisone has reduced the # of spells but she stopped a few months ago  Orthostatic trembling:   She has trembling that is more likely to occur while standing but her hand tremors csn occur while sitting.   Standing and fatigue worsen the tremor.   She has not noted much change in handwriting but needs to grip the pen more.    HA/neck pain:    Headaches are worse again and are occurring 30/30 days.    Pain is in the occiput, and in the frontal scalp and periorbital.   Left = right.     Pain is throbbing.   Bright lights  and moving worsens the pain.  Nothing really triggers the pain when they occur.   Vision is ok when pain is gone or mild but blurry when pain intensifies.  Marland Kitchen    MRI's of her spine were normal.    FMS:   FMS pain is much better.    Lyrica to 100 mg po bid helped her FMS pain initially but she stopped to switch to scented oils as they also help.     When present, pain is R > L and mostly near the spine in the upper body and in the right lower back down to the legs lower down.   Bending her ankles sometimes increase the leg pain.  Lifting increases her pain.       Insomnia:   She has been sleeping better with bedtime flexeril.   If she misses the dose, she is more tired and achy the next day.      HA History:  She reports a history of headaches since childhood. For many years the headaches were occurring 2-3 times a month and would last 1-2 days. With the headache she will get nausea, photophobia and phonophobia. Moving the head makes the pain worse. The headaches have been worsening this year. Currently she has a headache that has lasted one week. This  one is associated with pain entirely on the right side that is throbbing at times. The headache started in the 4 head then went to the occiput. It intensified quite a bit at that point and shot up and down. She has taken over-the-counter anti-inflammatory medications. Additionally she went to the emergency room last week and was given Valium and oxycodone (more for her back). Those only helped her for short period of time. She has never tried a triptan. She has never been on prophylactic medications for the headaches.  Presyncope History:  Since 2016, she has had many episodes of lightheadedness and even one episode of syncope. These episodes fluctuate quite a bit and some months have been much worse than others. 3, and these are occurring she will stand up and get lightheaded she often has to get back down to a chair because she doesn't feel safe if she would  stand up for longer period of time. One time she actually passed out. Most of the time, the spells occur while she is standing but sometimes she will get a lightheaded spell while laying down and feel very numb.   She reports having a Holter monitor for a week. There was variation in her heart rate by her report but I do not have the official cardiology interpretation [facility]).   She notes some swelling in her feet, right greater than left. Sometimes the whole right leg will be more swollen.     REVIEW OF SYSTEMS: Constitutional: No fevers, chills, sweats, or change in appetite.   She notes Eyes: No visual changes, double vision, eye pain Ear, nose and throat: No hearing loss, ear pain, nasal congestion, sore throat Cardiovascular: No chest pain, palpitations Respiratory: No shortness of breath at rest or with exertion.   No wheezes GastrointestinaI: No nausea, vomiting, diarrhea, abdominal pain, fecal incontinence Genitourinary: No dysuria, urinary retention or frequency.  No nocturia. Musculoskeletal: as above Integumentary: No rash, pruritus, skin lesions Neurological: as above Psychiatric: No depression at this time.  No anxiety Endocrine: No palpitations, diaphoresis, change in appetite, change in weigh or increased thirst Hematologic/Lymphatic: No anemia, purpura, petechiae. Allergic/Immunologic: No itchy/runny eyes, nasal congestion, recent allergic reactions, rashes  ALLERGIES: Allergies  Allergen Reactions  . Terbutaline Sulfate     arrythmia  . Vitamin A     Headaches   . Latex Rash    HOME MEDICATIONS:  Current Outpatient Prescriptions:  .  cyclobenzaprine (FLEXERIL) 5 MG tablet, Take 1 tablet (5 mg total) by mouth every 8 (eight) hours as needed for muscle spasms., Disp: 30 tablet, Rfl: 5 .  fludrocortisone (FLORINEF) 0.1 MG tablet, Take 1 tablet (0.1 mg total) by mouth daily., Disp: 30 tablet, Rfl: 11  PAST MEDICAL HISTORY: Past Medical History:    Diagnosis Date  . Allergy   . Fibromyalgia   . Migraines   . Raynaud's disease   . Ureterolithiasis     PAST SURGICAL HISTORY: Past Surgical History:  Procedure Laterality Date  . APPENDECTOMY    . BREAST BIOPSY    . DILATION AND CURETTAGE OF UTERUS    . kidney stent    . LAPAROSCOPY    . TONSILLECTOMY    . TUBAL LIGATION    . TUBAL LIGATION      FAMILY HISTORY: Family History  Problem Relation Age of Onset  . Hypertension Mother   . Arthritis Mother   . Breast cancer Mother 66  . Uterine cancer Mother   . Legg-Calve-Perthes disease Father   .  Healthy Brother   . Asthma Daughter   . Healthy Daughter   . Breast cancer Maternal Grandmother        dx in her 80s  . Throat cancer Maternal Grandfather        smoker; dx in 58s  . Melanoma Paternal Grandfather   . Hypertension Paternal Grandfather   . Diabetes Paternal Grandfather   . Heart attack Paternal Grandfather   . Melanoma Maternal Aunt        dx in her 25s    SOCIAL HISTORY:  Social History   Social History  . Marital status: Married    Spouse name: N/A  . Number of children: N/A  . Years of education: N/A   Occupational History  . Not on file.   Social History Main Topics  . Smoking status: Never Smoker  . Smokeless tobacco: Never Used  . Alcohol use No  . Drug use: No  . Sexual activity: Yes    Partners: Male    Birth control/ protection: Surgical     Comment: tubal ligation   Other Topics Concern  . Not on file   Social History Narrative  . No narrative on file     PHYSICAL EXAM  Vitals:   07/16/16 1325  BP: 113/71  Pulse: 73  Resp: 16  Weight: 135 lb (61.2 kg)  Height: 5\' 7"  (1.702 m)   Pulse goes from 76 to 98 upon standing  Body mass index is 21.14 kg/m.    General: The patient is well-developed and well-nourished and in no acute distress  Neck: The neck is supple, no carotid bruits are noted.  The neck is tender at the right > left  occiput and to a lesser extent  the paraspinal muscles .   Neurologic Exam  Mental status: The patient is alert and oriented x 3 at the time of the examination. The patient has apparent normal recent and remote memory, with an apparently normal attention span and concentration ability.   Speech is normal.  Cranial nerves: Extraocular movements are full.  There is good facial sensation to soft touch bilaterally.Facial strength is normal.  Trapezius and sternocleidomastoid strength is normal. No dysarthria is noted.  The tongue is midline, and the patient has symmetric elevation of the soft palate. No obvious hearing deficits are noted.  Motor:  She has a right greater than left rest tremor that worsens when she stands up Muscle bulk is normal.   Tone is normal. Strength is  5 / 5 in all 4 extremities.   Sensory: Sensory testing is intact to touch in all 4 extremities.  Coordination: Cerebellar testing reveals good finger-nose-finger  bilaterally.  Gait and station: Station is normal.   Gait is mildly wide and she has difficulty doing a tandem..    Reflexes: Deep tendon reflexes are symmetric and normal bilaterally.       DIAGNOSTIC DATA (LABS, IMAGING, TESTING) - I reviewed patient records, labs, notes, testing and imaging myself where available.  Lab Results  Component Value Date   WBC 5.6 07/09/2016   HGB 13.5 07/09/2016   HCT 41.2 07/09/2016   MCV 77 (L) 07/09/2016   PLT 244 07/09/2016      Component Value Date/Time   NA 138 07/09/2016 1507   K 4.6 07/09/2016 1507   CL 102 07/09/2016 1507   CO2 22 07/09/2016 1507   GLUCOSE 78 07/09/2016 1507   GLUCOSE 84 11/19/2015 0243   BUN 12 07/09/2016 1507   CREATININE  0.65 07/09/2016 1507   CALCIUM 9.8 07/09/2016 1507   PROT 7.7 07/09/2016 1507   ALBUMIN 4.9 07/09/2016 1507   AST 16 07/09/2016 1507   ALT 15 07/09/2016 1507   ALKPHOS 54 07/09/2016 1507   BILITOT 1.4 (H) 07/09/2016 1507   GFRNONAA 118 07/09/2016 1507   GFRAA 136 07/09/2016 1507    Lab  Results  Component Value Date   TSH 0.802 07/09/2016       ASSESSMENT AND PLAN  POTS (postural orthostatic tachycardia syndrome)  Tremor  Neck pain  Periodic limb movement disorder (PLMD)  Chronic migraine  Syncope, unspecified syncope type  Injury of head, initial encounter   1.   Episode of loss of consciousness they have been associated with the fall but she has had syncope and presyncope related to POTS. I'll get her back on low-dose fludrocortisone. This might also help her positional tremor 2.    Trigger point injections into bilateral splenius capitus muscles using 80 mg Depo-Medrol and sterile technique.   She tolerated the injections well and there were no complications. Pain was better afterwards.  3.    If her resting tremor worsens, consider a beta blocker. 4.    She had a head CT and cervical spine CT when she went to the [facility]. However, if gait does not improve consider an MRI.  She will return to see me in 4 months or sooner if there are new or worsening neurologic symptoms.  Ladislav Caselli A. Felecia Shelling, MD, PhD 10/19/7739, 4:23 PM Certified in Neurology, Clinical Neurophysiology, Sleep Medicine, Pain Medicine and Neuroimaging  [facility] [address], [place], [place] [address] [phone]   `

## 2016-07-16 NOTE — Assessment & Plan Note (Signed)
Reviewed head CT imaging from ED-no acute findings. Advised to see established Neurologist ASAP. She communicated understanding/agreement.

## 2016-07-16 NOTE — Progress Notes (Signed)
Subjective:    Patient ID: Cindy Turner, female    DOB: 02/24/1985, 31 y.o.   MRN: [id number]  HPI:  Cindy Turner is here for f/u: acute sx's s/p fall on 07/04/2016.  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  Pain rated at 4/10 and is dull and constant, but will increase with exertion/deep breathing.  She denies bowel/bladder disorder and resumed normal BMs this week (after a day of diarrhea).  She continues to push water but has had a poor appetite with low-grade nausea, she is 2 lbs down from last OV on 07/09/2016.  She also reports increased frequency/severity of upper extremity tremor, "shakiness", and POTS episodes.  She is followed by Neurology, last OV was Feb 2018.  She denies fever/night sweats, however reports daytime chills.  She had a "horrible HA" Tuesday and "it wiped me out".   She denies noting any bruising anywhere.  Patient Care Team    Relationship Specialty Notifications Start End  Mellody Dance, DO PCP - General Family Medicine  12/25/15   Reche Dixon, PA-C Consulting Physician Orthopedic Surgery  12/25/15   Adrian Prows, MD Consulting Physician Cardiology  12/25/15   Britt Bottom, MD  Neurology  12/25/15     Patient Active Problem List   Diagnosis Date Noted  . Rib contusion, right, sequela 07/09/2016  . Chronic migraine 04/22/2016  . Dysuria 01/16/2016  . Acute bilateral low back pain 01/16/2016  . Painless hematuria 01/16/2016  . Irregular periods 01/16/2016  . Right shoulder injury- ortho 12/25/2015  . Family history of diabetes mellitus 12/25/2015  . OCD (obsessive compulsive disorder) 12/25/2015  . Adjustment disorder with mixed anxiety and depressed mood 12/25/2015  . Family history of breast cancer in first degree relative- mother age 34 12/25/2015  . Family history of uterine cancer- Mom age 40  12/25/2015  . Family history of malignant melanoma of skin- in 30's 12/25/2015  . Common migraine without intractability 11/25/2015  . Occipital neuralgia of right side 11/25/2015  . Neck pain 11/25/2015  . POTS (postural orthostatic tachycardia syndrome) 11/25/2015  . Lightheadedness 11/25/2015  . Insomnia 11/25/2015  . Periodic limb movement disorder (PLMD) 11/25/2015  . Right wrist injury and R shoulder- injured at work- ortho 09/11/2015  . 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 Fibromyalgia 05/16/2011  . Abdominal pain, acute, right upper quadrant 09/01/2010     Past Medical History:  Diagnosis Date  . Allergy   . Fibromyalgia   . Migraines   . Raynaud's disease   . Ureterolithiasis      Past Surgical History:  Procedure Laterality Date  . APPENDECTOMY    . BREAST BIOPSY    . DILATION AND CURETTAGE OF UTERUS    . kidney stent    . LAPAROSCOPY    . TONSILLECTOMY    . TUBAL LIGATION    . TUBAL LIGATION       Family History  Problem Relation Age of Onset  . Hypertension Mother   . Arthritis Mother   . Breast cancer Mother 70  . Uterine cancer Mother   . Legg-Calve-Perthes disease Father   . Healthy Brother   . Asthma Daughter   . Healthy Daughter   . Breast cancer Maternal Grandmother        dx in her 17s  . Throat cancer Maternal Grandfather  smoker; dx in 28s  . Melanoma Paternal Grandfather   . Hypertension Paternal Grandfather   . Diabetes Paternal Grandfather   . Heart attack Paternal Grandfather   . Melanoma Maternal Aunt        dx in her 64s     History  Drug Use No     History  Alcohol Use No     History  Smoking Status  . Never Smoker  Smokeless Tobacco  . Never Used     Outpatient Encounter Prescriptions as of 07/16/2016  Medication Sig  . cyclobenzaprine (FLEXERIL) 5 MG tablet Take 1 tablet (5 mg total) by mouth every 8 (eight) hours as needed for muscle spasms.  . [DISCONTINUED] B-Complex-C TABS Take 1 tablet by mouth  daily.  . [DISCONTINUED] cyclobenzaprine (FLEXERIL) 5 MG tablet Take 5 mg by mouth as needed.  . [DISCONTINUED] fludrocortisone (FLORINEF) 0.1 MG tablet Take 1 tablet (0.1 mg total) by mouth daily.  . [DISCONTINUED] Ginkgo Biloba (GNP GINGKO BILOBA EXTRACT PO) Take 1 tablet by mouth daily.  . [DISCONTINUED] ibuprofen (ADVIL,MOTRIN) 200 MG tablet Take 3 tablets (600 mg total) by mouth once.  . [DISCONTINUED] Turmeric 500 MG CAPS Take 1 capsule by mouth daily.  . [DISCONTINUED] vitamin C (ASCORBIC ACID) 500 MG tablet Take 500 mg by mouth daily as needed.  . [EXPIRED] ibuprofen (ADVIL,MOTRIN) tablet 600 mg    No facility-administered encounter medications on file as of 07/16/2016.     Allergies: Terbutaline sulfate; Vitamin a; and Latex  Body mass index is 21 kg/m.  Blood pressure 99/64, pulse 87, height 5\' 7"  (1.702 m), weight 134 lb 1.6 oz (60.8 kg), last menstrual period 07/06/2016.    Review of Systems  Constitutional: Positive for activity change, appetite change, chills, fatigue and unexpected weight change. Negative for diaphoresis and fever.  Eyes: Negative for visual disturbance.  Respiratory: Negative for cough, chest tightness, shortness of breath, wheezing and stridor.   Cardiovascular: Negative for chest pain, palpitations and leg swelling.  Gastrointestinal: Positive for diarrhea and nausea. Negative for abdominal distention, abdominal pain, blood in stool, constipation and vomiting.  Endocrine: Positive for cold intolerance and heat intolerance. Negative for polydipsia, polyphagia and polyuria.  Genitourinary: Negative for difficulty urinating, flank pain and hematuria.  Musculoskeletal: Positive for arthralgias, back pain, gait problem, joint swelling and myalgias. Negative for neck pain and neck stiffness.  Skin: Negative for color change, pallor, rash and wound.  Neurological: Positive for dizziness, tremors, weakness, light-headedness and headaches. Negative for seizures  and speech difficulty.  Hematological: Does not bruise/bleed easily.  Psychiatric/Behavioral: Negative for confusion, dysphoric mood and sleep disturbance.       Objective:   Physical Exam  Constitutional: She is oriented to person, place, and time. She appears well-developed and well-nourished. No distress.  HENT:  Head: Normocephalic and atraumatic.  Right Ear: External ear normal.  Left Ear: External ear normal.  Eyes: Conjunctivae are normal. Pupils are equal, round, and reactive to light.  Cardiovascular: Normal rate, regular rhythm, normal heart sounds and intact distal pulses.   No murmur heard. Pulmonary/Chest: Effort normal and breath sounds normal. No respiratory distress. She has no wheezes. She has no rales. She exhibits no tenderness.  No ecchymosis noted on anterior/posterior chest.  Abdominal: Soft. Bowel sounds are normal. She exhibits no distension and no mass. There is tenderness in the right upper quadrant and left upper quadrant. There is no rebound, no guarding and no CVA tenderness.  Neurological: She is alert and oriented to  person, place, and time. She displays tremor. She displays no atrophy. No cranial nerve deficit or sensory deficit. She exhibits normal muscle tone. Coordination and gait normal.  Fine tremor noted in upper extremities.  Skin: Skin is warm and dry. No rash noted. She is not diaphoretic. No erythema. No pallor.  Psychiatric: She has a normal mood and affect. Her behavior is normal. Judgment and thought content normal.          Assessment & Plan:   1. Rib contusion, right, sequela   2. Rib contusion, right, initial encounter     Rib contusion, right, sequela Still experiencing pain R/L ribs, difficult to deep breath. Ordered repeat CXR. Continue with cyclobenzaprine and OTC NSAIDs. Continue rest.  Lightheadedness Reviewed head CT imaging from ED-no acute findings. Advised to see established Neurologist ASAP. She communicated  understanding/agreement.  Periodic limb movement disorder (PLMD) Reviewed head CT imaging from ED-no acute findings. Advised to see established Neurologist ASAP. She communicated understanding/agreement.    FOLLOW-UP:  Return if symptoms worsen or fail to improve.

## 2016-08-19 ENCOUNTER — Ambulatory Visit: Payer: 59 | Admitting: Family Medicine

## 2016-08-19 ENCOUNTER — Telehealth: Payer: Self-pay

## 2016-08-19 ENCOUNTER — Encounter: Payer: Self-pay | Admitting: Family Medicine

## 2016-08-19 VITALS — BP 103/69 | HR 64 | Ht 67.0 in | Wt 133.5 lb

## 2016-08-19 DIAGNOSIS — R112 Nausea with vomiting, unspecified: Secondary | ICD-10-CM

## 2016-08-19 DIAGNOSIS — Z87442 Personal history of urinary calculi: Secondary | ICD-10-CM

## 2016-08-19 DIAGNOSIS — M546 Pain in thoracic spine: Secondary | ICD-10-CM

## 2016-08-19 DIAGNOSIS — N200 Calculus of kidney: Secondary | ICD-10-CM

## 2016-08-19 DIAGNOSIS — R319 Hematuria, unspecified: Secondary | ICD-10-CM | POA: Diagnosis not present

## 2016-08-19 DIAGNOSIS — Z9889 Other specified postprocedural states: Secondary | ICD-10-CM

## 2016-08-19 DIAGNOSIS — N3281 Overactive bladder: Secondary | ICD-10-CM

## 2016-08-19 DIAGNOSIS — R309 Painful micturition, unspecified: Secondary | ICD-10-CM

## 2016-08-19 LAB — POCT URINALYSIS DIPSTICK
BILIRUBIN UA: NEGATIVE
Glucose, UA: NEGATIVE
KETONES UA: NEGATIVE
Nitrite, UA: NEGATIVE
PH UA: 7 (ref 5.0–8.0)
PROTEIN UA: 30
Spec Grav, UA: 1.02 (ref 1.010–1.025)
Urobilinogen, UA: 0.2 E.U./dL

## 2016-08-19 MED ORDER — TAMSULOSIN HCL 0.4 MG PO CAPS: 0.4000 mg | ORAL_CAPSULE | Freq: Every day | ORAL | 0 refills | Status: DC

## 2016-08-19 MED ORDER — HYDROCODONE-ACETAMINOPHEN 5-325 MG PO TABS: 1.0000 | ORAL_TABLET | ORAL | 0 refills | Status: DC | PRN

## 2016-08-19 NOTE — Progress Notes (Signed)
Pt here for an acute care OV today   Impression and Recommendations:    1. Urinary pain      No problem-specific Assessment & Plan notes found for this encounter.   The patient was counseled, risk factors were discussed, anticipatory guidance given.  New Prescriptions   No medications on file    Discontinued Medications   No medications on file     Orders Placed This Encounter  Procedures  . POCT urinalysis dipstick     Gross side effects, risk and benefits, and alternatives of medications and treatment plan in general discussed with patient.  Patient is aware that all medications have potential side effects and we are unable to predict every side effect or drug-drug interaction that may occur.   Patient will call with any questions prior to using medication if they have concerns.  Expresses verbal understanding and consents to current therapy and treatment regimen.  No barriers to understanding were identified.  Red flag symptoms and signs discussed in detail.  Patient expressed understanding regarding what to do in case of emergency\urgent symptoms  Please see AVS handed out to patient at the end of our visit for further patient instructions/ counseling done pertaining to today's office visit.   No Follow-up on file.     Note: This document was prepared using Dragon voice recognition software and may include unintentional dictation errors.  Mellody Dance 9:47 AM --------------------------------------------------------------------------------------------------------------------------------------------------------------------------------------------------------------------------------------------    Subjective:    CC:  Chief Complaint  Patient presents with  . Back Pain    mid back pain radiates into left side and abd area x 4 days - burning with urination 4 days ago and blood in urine 2 days ago     HPI: Cindy Turner is a 31 y.o. female who  presents to [place] at [facility] today for issues as discussed below.  4-5 days ago with burning w urination, to then cramping and blood in urine, Then last night- started with back pain on L lower side, feels like radiates around to the front into the groin.  + freq and urgency.  Some N, minimal vomiting- she hates to vomit.  Isn't able to eat or drink today- due to N.   No F, + Chills.   Had stone in past - R side though- was small enough to pass them but had sx on one back in 2008. These sx feels like a stone.    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   No problems updated.   Wt Readings from Last 3 Encounters:  08/19/16 133 lb 8 oz (60.6 kg)  07/16/16 135 lb (61.2 kg)  07/16/16 134 lb 1.6 oz (60.8 kg)   BP Readings from Last 3 Encounters:  08/19/16 103/69  07/16/16 113/71  07/16/16 99/64   BMI Readings from Last 3 Encounters:  08/19/16 20.91 kg/m  07/16/16 21.14 kg/m  07/16/16 21.00 kg/m     Patient Care Team    Relationship Specialty Notifications Start End  Mellody Dance, DO PCP - General Family Medicine  12/25/15   Reche Dixon, PA-C Consulting Physician Orthopedic Surgery  12/25/15   Adrian Prows, MD Consulting Physician Cardiology  12/25/15   Britt Bottom, MD  Neurology  12/25/15      Patient Active Problem List   Diagnosis Date Noted  . Family history of diabetes mellitus 12/25/2015    Priority: High  . Adjustment disorder with mixed anxiety and depressed  mood 12/25/2015    Priority: High  . Tremor 07/16/2016  . Syncope 07/16/2016  . Head injury 07/16/2016  . Rib contusion, right, sequela 07/09/2016  . Chronic migraine 04/22/2016  . Chronic pelvic pain in female 01/17/2016  . Endometriosis of pelvic peritoneum 01/17/2016  . Hx of tubal ligation 01/17/2016  . Vaginitis 01/17/2016  . Dysuria 01/16/2016  . Acute bilateral low back pain 01/16/2016  . Painless hematuria 01/16/2016  . Irregular periods 01/16/2016    . Right shoulder injury- ortho 12/25/2015  . OCD (obsessive compulsive disorder) 12/25/2015  . Family history of breast cancer in first degree relative- mother age 22 12/25/2015  . Family history of uterine cancer- Mom age 83 12/25/2015  . Family history of malignant melanoma of skin- in 30's 12/25/2015  . Common migraine without intractability 11/25/2015  . Occipital neuralgia of right side 11/25/2015  . Neck pain 11/25/2015  . POTS (postural orthostatic tachycardia syndrome) 11/25/2015  . Lightheadedness 11/25/2015  . Insomnia 11/25/2015  . Periodic limb movement disorder (PLMD) 11/25/2015  . Right wrist injury and R shoulder- injured at work- ortho 09/11/2015  . 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 Fibromyalgia 05/16/2011  . Abdominal pain, acute, right upper quadrant 09/01/2010    Past Medical history, Surgical history, Family history, Social history, Allergies and Medications have been entered into the medical record, reviewed and changed as needed.    Current Meds  Medication Sig  . cyclobenzaprine (FLEXERIL) 5 MG tablet Take 1 tablet (5 mg total) by mouth every 8 (eight) hours as needed for muscle spasms.  . fludrocortisone (FLORINEF) 0.1 MG tablet Take 1 tablet (0.1 mg total) by mouth daily.    Allergies:  Allergies  Allergen Reactions  . Terbutaline Sulfate     arrythmia  . Vitamin A     Headaches   . Latex Rash     Review of Systems: General:   Denies fever, chills, unexplained weight loss.  Optho/Auditory:   Denies visual changes, blurred vision/LOV Respiratory:   Denies wheeze, DOE more than baseline levels.  Cardiovascular:   Denies chest pain, palpitations, new onset peripheral edema  Gastrointestinal:   Denies nausea, vomiting, diarrhea, abd pain.  Genitourinary: Denies dysuria, freq/ urgency, flank pain or discharge from genitals.  Endocrine:     Denies hot or cold intolerance, polyuria, polydipsia. Musculoskeletal:   Denies unexplained myalgias, joint  swelling, unexplained arthralgias, gait problems.  Skin:  Denies new onset rash, suspicious lesions Neurological:     Denies dizziness, unexplained weakness, numbness  Psychiatric/Behavioral:   Denies mood changes, suicidal or homicidal ideations, hallucinations    Objective:   Blood pressure 103/69, pulse 64, height 5\' 7"  (1.702 m), weight 133 lb 8 oz (60.6 kg), last menstrual period 08/06/2016. Body mass index is 20.91 kg/m. General:  Well Developed, well nourished, appropriate for stated age.  Neuro:  Alert and oriented,  extra-ocular muscles intact  HEENT:  Normocephalic, atraumatic, neck supple Skin:  no gross rash, warm, pink. Cardiac:  RRR, S1 S2 Respiratory:  ECTA B/L and A/P, Not using accessory muscles, speaking in full sentences- unlabored. Vascular:  Ext warm, no cyanosis apprec.; cap RF less 2 sec. Psych:  No HI/SI, judgement and insight good, Euthymic mood. Full Affect.

## 2016-08-19 NOTE — Telephone Encounter (Signed)
CT does not require prior authorization.

## 2016-08-19 NOTE — Patient Instructions (Addendum)
Please go get your CT scan to see if there is a stone today.,    Strain all of your urine to see if you can obtain the stone.    If increasing water intake as well as taking pain meds did not control your pain well enough, please proceed to emergency room, especially if you cannot take anything by mouth or even keep down water.  - I also referred you to urologist per year request so u will have one here in [place], rather than having to travel to [place]   Kidney Stones Kidney stones (urolithiasis) are solid, rock-like deposits that form inside of the organs that make urine (kidneys). A kidney stone may form in a kidney and move into the bladder, where it can cause intense pain and block the flow of urine. Kidney stones are created when high levels of certain minerals are found in the urine. They are usually passed through urination, but in some cases, medical treatment may be needed to remove them. What are the causes? Kidney stones may be caused by:  A condition in which certain glands produce too much parathyroid hormone (primary hyperparathyroidism), which causes too much calcium buildup in the blood.  Buildup of uric acid crystals in the bladder (hyperuricosuria). Uric acid is a chemical that the body produces when you eat certain foods. It usually exits the body in the urine.  Narrowing (stricture) of one or both of the tubes that drain urine from the kidneys to the bladder (ureters).  A kidney blockage that is present at birth (congenital obstruction).  Past surgery on the kidney or the ureters, such as gastric bypass surgery.  What increases the risk? The following factors make you more likely to develop kidney stones:  Having had a kidney stone in the past.  Having a family history of kidney stones.  Not drinking enough water.  Eating a diet that is high in protein, salt (sodium), or sugar.  Being overweight or obese.  What are the signs or symptoms? Symptoms of  a kidney stone may include:  Nausea.  Vomiting.  Blood in the urine (hematuria).  Pain in the side of the abdomen, right below the ribs (flank pain). Pain usually spreads (radiates) to the groin.  Needing to urinate frequently or urgently.  How is this diagnosed? This condition may be diagnosed based on:  Your medical history.  A physical exam.  Blood tests.  Urine tests.  CT scan.  Abdominal X-ray.  A procedure to examine the inside of the bladder (cystoscopy).  How is this treated? Treatment for kidney stones depends on the size, location, and makeup of the stones. Treatment may involve:  Analyzing your urine before and after you pass the stone through urination.  Being monitored at the hospital until you pass the stone through urination.  Increasing your fluid intake and decreasing the amount of calcium and protein in your diet.  A procedure to break up kidney stones in the bladder using: ? A focused beam of light (laser therapy). ? Shock waves (extracorporeal shock wave lithotripsy).  Surgery to remove kidney stones. This may be needed if you have severe pain or have stones that block your urinary tract.  Follow these instructions at home: Eating and drinking   Drink enough fluid to keep your urine clear or pale yellow. This will help you to pass the kidney stone.  If directed, change your diet. This may include: ? Limiting how much sodium you eat. ? Eating more fruits  and vegetables. ? Limiting how much meat, poultry, fish, and eggs you eat.  Follow instructions from your health care provider about eating or drinking restrictions. General instructions  Collect urine samples as told by your health care provider. You may need to collect a urine sample: ? 24 hours after you pass the stone. ? 8-12 weeks after passing the kidney stone, and every 6-12 months after that.  Strain your urine every time you urinate, for as long as directed. Use the strainer  that your health care provider recommends.  Do not throw out the kidney stone after passing it. Keep the stone so it can be tested by your health care provider. Testing the makeup of your kidney stone may help prevent you from getting kidney stones in the future.  Take over-the-counter and prescription medicines only as told by your health care provider.  Keep all follow-up visits as told by your health care provider. This is important. You may need follow-up X-rays or ultrasounds to make sure that your stone has passed. How is this prevented? To prevent another kidney stone:  Drink enough fluid to keep your urine clear or pale yellow. This is the best way to prevent kidney stones.  Eat a healthy diet and follow recommendations from your health care provider about foods to avoid. You may be instructed to eat a low-protein diet. Recommendations vary depending on the type of kidney stone that you have.  Maintain a healthy weight.  Contact a health care provider if:  You have pain that gets worse or does not get better with medicine. Get help right away if:  You have a fever or chills.  You develop severe pain.  You develop new abdominal pain.  You faint.  You are unable to urinate. This information is not intended to replace advice given to you by your health care provider. Make sure you discuss any questions you have with your health care provider. Document Released: 01/26/2005 Document Revised: 08/16/2015 Document Reviewed: 07/12/2015 Elsevier Interactive Patient Education  2017 [place].  Dietary Guidelines to Help Prevent Kidney Stones Kidney stones are deposits of minerals and salts that form inside your kidneys. Your risk of developing kidney stones may be greater depending on your diet, your lifestyle, the medicines you take, and whether you have certain medical conditions. Most people can reduce their chances of developing kidney stones by following the instructions  below. Depending on your overall health and the type of kidney stones you tend to develop, your dietitian may give you more specific instructions. What are tips for following this plan? Reading food labels  Choose foods with "no salt added" or "low-salt" labels. Limit your sodium intake to less than 1500 mg per day.  Choose foods with calcium for each meal and snack. Try to eat about 300 mg of calcium at each meal. Foods that contain 200-500 mg of calcium per serving include: ? 8 oz (237 ml) of milk, fortified nondairy milk, and fortified fruit juice. ? 8 oz (237 ml) of kefir, yogurt, and soy yogurt. ? 4 oz (118 ml) of tofu. ? 1 oz of cheese. ? 1 cup (300 g) of dried figs. ? 1 cup (91 g) of cooked broccoli. ? 1-3 oz can of sardines or mackerel.  Most people need 1000 to 1500 mg of calcium each day. Talk to your dietitian about how much calcium is recommended for you. Shopping  Buy plenty of fresh fruits and vegetables. Most people do not need to avoid fruits  and vegetables, even if they contain nutrients that may contribute to kidney stones.  When shopping for convenience foods, choose: ? Whole pieces of fruit. ? Premade salads with dressing on the side. ? Low-fat fruit and yogurt smoothies.  Avoid buying frozen meals or prepared deli foods.  Look for foods with live cultures, such as yogurt and kefir. Cooking  Do not add salt to food when cooking. Place a salt shaker on the table and allow each person to add his or her own salt to taste.  Use vegetable protein, such as beans, textured vegetable protein (TVP), or tofu instead of meat in pasta, casseroles, and soups. Meal planning  Eat less salt, if told by your dietitian. To do this: ? Avoid eating processed or premade food. ? Avoid eating fast food.  Eat less animal protein, including cheese, meat, poultry, or fish, if told by your dietitian. To do this: ? Limit the number of times you have meat, poultry, fish, or cheese each  week. Eat a diet free of meat at least 2 days a week. ? Eat only one serving each day of meat, poultry, fish, or seafood. ? When you prepare animal protein, cut pieces into small portion sizes. For most meat and fish, one serving is about the size of one deck of cards.  Eat at least 5 servings of fresh fruits and vegetables each day. To do this: ? Keep fruits and vegetables on hand for snacks. ? Eat 1 piece of fruit or a handful of berries with breakfast. ? Have a salad and fruit at lunch. ? Have two kinds of vegetables at dinner.  Limit foods that are high in a substance called oxalate. These include: ? Spinach. ? Rhubarb. ? Beets. ? Potato chips and french fries. ? Nuts.  If you regularly take a diuretic medicine, make sure to eat at least 1-2 fruits or vegetables high in potassium each day. These include: ? Avocado. ? Banana. ? Orange, prune, carrot, or tomato juice. ? Baked potato. ? Cabbage. ? Beans and split peas. General instructions  Drink enough fluid to keep your urine clear or pale yellow. This is the most important thing you can do.  Talk to your health care provider and dietitian about taking daily supplements. Depending on your health and the cause of your kidney stones, you may be advised: ? Not to take supplements with vitamin C. ? To take a calcium supplement. ? To take a daily probiotic supplement. ? To take other supplements such as magnesium, fish oil, or vitamin B6.  Take all medicines and supplements as told by your health care provider.  Limit alcohol intake to no more than 1 drink a day for nonpregnant women and 2 drinks a day for men. One drink equals 12 oz of beer, 5 oz of wine, or 1 oz of hard liquor.  Lose weight if told by your health care provider. Work with your dietitian to find strategies and an eating plan that works best for you. What foods are not recommended? Limit your intake of the following foods, or as told by your dietitian. Talk to  your dietitian about specific foods you should avoid based on the type of kidney stones and your overall health. Grains Breads. Bagels. Rolls. Baked goods. Salted crackers. Cereal. Pasta. Vegetables Spinach. Rhubarb. Beets. Canned vegetables. Angie Fava. Olives. Meats and other protein foods Nuts. Nut butters. Large portions of meat, poultry, or fish. Salted or cured meats. Deli meats. Hot dogs. Sausages. Dairy Cheese. Beverages  Regular soft drinks. Regular vegetable juice. Seasonings and other foods Seasoning blends with salt. Salad dressings. Canned soups. Soy sauce. Ketchup. Barbecue sauce. Canned pasta sauce. Casseroles. Pizza. Lasagna. Frozen meals. Potato chips. [place] fries. Summary  You can reduce your risk of kidney stones by making changes to your diet.  The most important thing you can do is drink enough fluid. You should drink enough fluid to keep your urine clear or pale yellow.  Ask your health care provider or dietitian how much protein from animal sources you should eat each day, and also how much salt and calcium you should have each day. This information is not intended to replace advice given to you by your health care provider. Make sure you discuss any questions you have with your health care provider. Document Released: 05/23/2010 Document Revised: 01/07/2016 Document Reviewed: 01/07/2016 Elsevier Interactive Patient Education  2017 [employer].

## 2016-08-20 ENCOUNTER — Telehealth: Payer: Self-pay | Admitting: Family Medicine

## 2016-08-20 ENCOUNTER — Ambulatory Visit
Admission: RE | Admit: 2016-08-20 | Discharge: 2016-08-20 | Disposition: A | Discharge status: Home / Self Care | Payer: 59 | Source: Ambulatory Visit | Attending: Family Medicine | Admitting: Family Medicine

## 2016-08-20 ENCOUNTER — Other Ambulatory Visit: Payer: Self-pay | Admitting: Family Medicine

## 2016-08-20 DIAGNOSIS — N3281 Overactive bladder: Secondary | ICD-10-CM

## 2016-08-20 DIAGNOSIS — R300 Dysuria: Secondary | ICD-10-CM

## 2016-08-20 DIAGNOSIS — Z9889 Other specified postprocedural states: Secondary | ICD-10-CM

## 2016-08-20 DIAGNOSIS — M546 Pain in thoracic spine: Secondary | ICD-10-CM

## 2016-08-20 DIAGNOSIS — R319 Hematuria, unspecified: Secondary | ICD-10-CM | POA: Diagnosis not present

## 2016-08-20 DIAGNOSIS — Z87442 Personal history of urinary calculi: Secondary | ICD-10-CM

## 2016-08-20 DIAGNOSIS — R309 Painful micturition, unspecified: Secondary | ICD-10-CM

## 2016-08-20 DIAGNOSIS — N200 Calculus of kidney: Secondary | ICD-10-CM

## 2016-08-20 MED ORDER — ONDANSETRON 4 MG PO TBDP: 4.0000 mg | ORAL_TABLET | Freq: Three times a day (TID) | ORAL | 0 refills | Status: DC | PRN

## 2016-08-20 MED ORDER — PHENAZOPYRIDINE HCL 200 MG PO TABS: 200.0000 mg | ORAL_TABLET | Freq: Three times a day (TID) | ORAL | 0 refills | Status: AC | PRN

## 2016-08-20 NOTE — Progress Notes (Signed)
Patient notified. MPulliam, CMA/RT(R)  

## 2016-08-20 NOTE — Telephone Encounter (Signed)
Please call or let her know CT was completely negative\normal.  It did not show any evidence of stone or abnormality.  I sent her a prescription for Zofran oral dissolving tablet underneath her tongue that she can take if she feels nauseous and is unable to keep anything down.  Since her urinalysis only showed a small amount of white blood cells and nothing else, I recommend we wait the culture results and treat only if it is positive.  I will also send in a prescription for Pyridium that she can take for her urinary frequency, urgency and dysuria, only as needed.  I recommend she follow a brat diet\ bland diet, and eat only very small amounts, until her symptoms improve.  Please have her follow-up with urology for these urinary symptoms

## 2016-08-20 NOTE — Telephone Encounter (Signed)
Called patient and notified. MPulliam, CMA/RT(R)  

## 2016-08-20 NOTE — Telephone Encounter (Signed)
Patient was seen yesterday by Dr. Jenetta Downer and was advised to call back today if she wasn't able to drink anything still. She has tried everything from water, ginger ale, and peppermint tea. She wants to speak with someone clinical about what to do. She is currently on her way for the CT that was ordered.

## 2016-08-20 NOTE — Telephone Encounter (Signed)
Please advise 

## 2016-08-22 ENCOUNTER — Other Ambulatory Visit: Payer: Self-pay | Admitting: Family Medicine

## 2016-08-22 ENCOUNTER — Encounter: Payer: Self-pay | Admitting: Family Medicine

## 2016-08-23 LAB — URINE CULTURE

## 2016-08-24 ENCOUNTER — Other Ambulatory Visit: Payer: Self-pay | Admitting: Family Medicine

## 2016-08-24 ENCOUNTER — Other Ambulatory Visit: Payer: Self-pay

## 2016-08-24 ENCOUNTER — Ambulatory Visit: Payer: Commercial Managed Care - HMO | Admitting: Neurology

## 2016-08-24 DIAGNOSIS — N3001 Acute cystitis with hematuria: Secondary | ICD-10-CM

## 2016-08-24 DIAGNOSIS — N390 Urinary tract infection, site not specified: Secondary | ICD-10-CM | POA: Diagnosis not present

## 2016-08-24 DIAGNOSIS — B957 Other staphylococcus as the cause of diseases classified elsewhere: Secondary | ICD-10-CM | POA: Diagnosis not present

## 2016-08-24 MED ORDER — NITROFURANTOIN MONOHYD MACRO 100 MG PO CAPS: 100.0000 mg | ORAL_CAPSULE | Freq: Two times a day (BID) | ORAL | 0 refills | Status: DC

## 2016-08-24 NOTE — Progress Notes (Signed)
Patient notified. MPulliam, CMA/RT(R)  

## 2016-08-24 NOTE — Progress Notes (Signed)
Please call patient and let her know we sent in a antibiotic prescription to [facility] we have on file.

## 2016-08-24 NOTE — Telephone Encounter (Signed)
Spoke to patient and she states [facility] [address].  Resent the [facility] to this [facility].  MPulliam, CMA/RT(R)

## 2016-09-08 ENCOUNTER — Encounter: Payer: Self-pay | Admitting: Family Medicine

## 2016-10-19 ENCOUNTER — Encounter: Payer: Self-pay | Admitting: Adult Health

## 2016-10-19 ENCOUNTER — Encounter: Payer: Self-pay | Admitting: Family Medicine

## 2016-10-19 ENCOUNTER — Ambulatory Visit (INDEPENDENT_AMBULATORY_CARE_PROVIDER_SITE_OTHER): Payer: 59 | Admitting: Adult Health

## 2016-10-19 DIAGNOSIS — R112 Nausea with vomiting, unspecified: Secondary | ICD-10-CM

## 2016-10-19 DIAGNOSIS — H6691 Otitis media, unspecified, right ear: Secondary | ICD-10-CM

## 2016-10-19 DIAGNOSIS — J069 Acute upper respiratory infection, unspecified: Secondary | ICD-10-CM | POA: Diagnosis not present

## 2016-10-19 MED ORDER — ONDANSETRON 4 MG PO TBDP: 4.0000 mg | ORAL_TABLET | Freq: Three times a day (TID) | ORAL | 0 refills | Status: DC | PRN

## 2016-10-19 MED ORDER — FLUCONAZOLE 150 MG PO TABS: 150.0000 mg | ORAL_TABLET | Freq: Once | ORAL | 0 refills | Status: AC

## 2016-10-19 MED ORDER — HYDROCOD POLST-CPM POLST ER 10-8 MG/5ML PO SUER: 5.0000 mL | Freq: Two times a day (BID) | ORAL | 0 refills | Status: DC | PRN

## 2016-10-19 MED ORDER — AMOXICILLIN-POT CLAVULANATE 875-125 MG PO TABS: 1.0000 | ORAL_TABLET | Freq: Two times a day (BID) | ORAL | 0 refills | Status: DC

## 2016-10-19 NOTE — Progress Notes (Signed)
Subjective:    Patient ID: Cindy Turner, female    DOB: 12/28/1985, 31 y.o.   MRN: [id number]  HPI:  Cindy Turner is here with complaints of productive cough-thick yellow/grey sputum, HA (4/10), sore throat (3/10), facial pressure, R ear pain (6.10), fatigue, malaise, fever/chills, N/V, last emesis was yesterday.  She has not taken any OTC remedies and reports sx's present >4 weeks.  She denies nasal drainage.  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 She denies CP/dyspnea at rest/palpiations. She denies tobacco/EOTH use.  Patient Care Team    Relationship Specialty Notifications Start End  Mellody Dance, DO PCP - General Family Medicine  12/25/15   Reche Dixon, PA-C Consulting Physician Orthopedic Surgery  12/25/15   Adrian Prows, MD Consulting Physician Cardiology  12/25/15   Britt Bottom, MD  Neurology  12/25/15     Patient Active Problem List   Diagnosis Date Noted  . Tremor 07/16/2016  . Syncope 07/16/2016  . Head injury 07/16/2016  . Rib contusion, right, sequela 07/09/2016  . Chronic migraine 04/22/2016  . Chronic pelvic pain in female 01/17/2016  . Endometriosis of pelvic peritoneum 01/17/2016  . Hx of tubal ligation 01/17/2016  . Vaginitis 01/17/2016  . Dysuria 01/16/2016  . Acute bilateral low back pain 01/16/2016  . Painless hematuria 01/16/2016  . Irregular periods 01/16/2016  . Right shoulder injury- ortho 12/25/2015  . Family history of diabetes mellitus 12/25/2015  . OCD (obsessive compulsive disorder) 12/25/2015  . Adjustment disorder with mixed anxiety and depressed mood 12/25/2015  . Family history of breast cancer in first degree relative- mother age 68 12/25/2015  . Family history of uterine cancer- Mom age 69 12/25/2015  . Family history of malignant melanoma of skin- in 30's 12/25/2015  . Common migraine without intractability 11/25/2015  . Occipital neuralgia of right side 11/25/2015   . Neck pain 11/25/2015  . POTS (postural orthostatic tachycardia syndrome) 11/25/2015  . Lightheadedness 11/25/2015  . Insomnia 11/25/2015  . Periodic limb movement disorder (PLMD) 11/25/2015  . Right wrist injury and R shoulder- injured at work- ortho 09/11/2015  . 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 Fibromyalgia 05/16/2011  . Abdominal pain, acute, right upper quadrant 09/01/2010     Past Medical History:  Diagnosis Date  . Allergy   . Fibromyalgia   . Migraines   . Raynaud's disease   . Ureterolithiasis      Past Surgical History:  Procedure Laterality Date  . APPENDECTOMY    . BREAST BIOPSY    . DILATION AND CURETTAGE OF UTERUS    . kidney stent    . LAPAROSCOPY    . TONSILLECTOMY    . TUBAL LIGATION    . TUBAL LIGATION       Family History  Problem Relation Age of Onset  . Hypertension Mother   . Arthritis Mother   . Breast cancer Mother 88  . Uterine cancer Mother   . Legg-Calve-Perthes disease Father   . Healthy Brother   . Asthma Daughter   . Healthy Daughter   . Breast cancer Maternal Grandmother        dx in her 93s  . Throat cancer Maternal Grandfather        smoker; dx in 40s  . Melanoma Paternal Grandfather   . Hypertension Paternal Grandfather   . Diabetes Paternal Grandfather   . Heart attack Paternal Grandfather   . Melanoma Maternal Aunt  dx in her 39s     History  Drug Use No     History  Alcohol Use No     History  Smoking Status  . Never Smoker  Smokeless Tobacco  . Never Used     Outpatient Encounter Prescriptions as of 10/19/2016  Medication Sig  . cyclobenzaprine (FLEXERIL) 5 MG tablet Take 1 tablet (5 mg total) by mouth every 8 (eight) hours as needed for muscle spasms.  . [DISCONTINUED] fludrocortisone (FLORINEF) 0.1 MG tablet Take 1 tablet (0.1 mg total) by mouth daily.  . [DISCONTINUED] HYDROcodone-acetaminophen (NORCO/VICODIN) 5-325 MG tablet Take 1 tablet by mouth every 4 (four) hours as needed for  severe pain.  . [DISCONTINUED] nitrofurantoin, macrocrystal-monohydrate, (MACROBID) 100 MG capsule Take 1 capsule (100 mg total) by mouth 2 (two) times daily.  . [DISCONTINUED] ondansetron (ZOFRAN ODT) 4 MG disintegrating tablet Take 1 tablet (4 mg total) by mouth every 8 (eight) hours as needed for nausea or vomiting.  . [DISCONTINUED] tamsulosin (FLOMAX) 0.4 MG CAPS capsule Take 1 capsule (0.4 mg total) by mouth daily. While having urinary\back pain   No facility-administered encounter medications on file as of 10/19/2016.     Allergies: Terbutaline sulfate; Vitamin a; and Latex  Body mass index is 21.57 kg/m.  Blood pressure 117/79, pulse 70, temperature 98 F (36.7 C), temperature source Oral, height 5\' 7"  (1.702 m), weight 137 lb 11.2 oz (62.5 kg), last menstrual period 10/09/2016.    Review of Systems  Constitutional: Positive for activity change, appetite change, chills, diaphoresis, fatigue and fever. Negative for unexpected weight change.  HENT: Positive for congestion, postnasal drip, rhinorrhea, sinus pressure, sneezing, sore throat and voice change. Negative for trouble swallowing.   Eyes: Negative for visual disturbance.  Respiratory: Positive for cough and shortness of breath. Negative for chest tightness, wheezing and stridor.   Cardiovascular: Negative for chest pain, palpitations and leg swelling.  Gastrointestinal: Positive for nausea and vomiting. Negative for abdominal distention, abdominal pain, blood in stool, constipation and diarrhea.  Endocrine: Negative for cold intolerance, heat intolerance, polydipsia, polyphagia and polyuria.  Genitourinary: Negative for difficulty urinating and flank pain.  Neurological: Positive for headaches.  Hematological: Does not bruise/bleed easily.  Psychiatric/Behavioral: Positive for sleep disturbance.       Objective:   Physical Exam  Constitutional: She is oriented to person, place, and time. She appears well-developed and  well-nourished. She appears lethargic.  Non-toxic appearance. She has a sickly appearance.  HENT:  Head: Normocephalic and atraumatic.  Right Ear: Hearing, external ear and ear canal normal. Tympanic membrane is erythematous and bulging. No decreased hearing is noted.  Left Ear: External ear and ear canal normal. Tympanic membrane is bulging. Tympanic membrane is not erythematous. No decreased hearing is noted.  Nose: Mucosal edema and rhinorrhea present. Right sinus exhibits frontal sinus tenderness. Right sinus exhibits no maxillary sinus tenderness. Left sinus exhibits frontal sinus tenderness. Left sinus exhibits no maxillary sinus tenderness.  Mouth/Throat: Uvula is midline. Posterior oropharyngeal edema and posterior oropharyngeal erythema present. No oropharyngeal exudate or tonsillar abscesses.  Copious clear drainage at back of throat.  Eyes: Pupils are equal, round, and reactive to light. Conjunctivae are normal.  Neck: Normal range of motion. Neck supple.  R anterior cervical lymphadenopathy.  Cardiovascular: Normal rate, regular rhythm, normal heart sounds and intact distal pulses.   No murmur heard. Pulmonary/Chest: Effort normal and breath sounds normal. No respiratory distress. She has no wheezes. She exhibits no tenderness.  Abdominal: Soft. Bowel sounds are  normal. She exhibits no distension and no mass. There is no tenderness. There is no rebound and no guarding.  Lymphadenopathy:    She has cervical adenopathy.  Neurological: She is oriented to person, place, and time. She appears lethargic.  Skin: Skin is warm and dry. No rash noted. She is not diaphoretic. No erythema. No pallor.  Psychiatric: She has a normal mood and affect. Her behavior is normal. Judgment and thought content normal.  Nursing note and vitals reviewed.         Assessment & Plan:   1. Nausea and vomiting, intractability of vomiting not specified, unspecified vomiting type   2. URI, acute   3.  Right otitis media, unspecified otitis media type     Nausea and vomiting Ondansetron rx refilled.  URI, acute Augmentin  [place] Controlled Substance Database verified- no contraindications noted to rx narcotic cough syrup. Increase fluids/rest/vit c. Alternate OTC Acetaminophen/Iburprofen for fever/body aches/sore throat.  Work excuse provided-Rest Up! If sx's persist after ABX completed, then RTC.   Right otitis media Augmentin    FOLLOW-UP:  Return if symptoms worsen or fail to improve.

## 2016-10-19 NOTE — Assessment & Plan Note (Signed)
Augmentin

## 2016-10-19 NOTE — Assessment & Plan Note (Signed)
Augmentin  [place] Controlled Substance Database verified- no contraindications noted to rx narcotic cough syrup. Increase fluids/rest/vit c. Alternate OTC Acetaminophen/Iburprofen for fever/body aches/sore throat.  Work excuse provided-Rest Up! If sx's persist after ABX completed, then RTC.

## 2016-10-19 NOTE — Assessment & Plan Note (Signed)
Ondansetron rx refilled.

## 2016-10-19 NOTE — Patient Instructions (Signed)
Upper Respiratory Infection, Adult Most upper respiratory infections (URIs) are a viral infection of the air passages leading to the lungs. A URI affects the nose, throat, and upper air passages. The most common type of URI is nasopharyngitis and is typically referred to as "the common cold." URIs run their course and usually go away on their own. Most of the time, a URI does not require medical attention, but sometimes a bacterial infection in the upper airways can follow a viral infection. This is called a secondary infection. Sinus and middle ear infections are common types of secondary upper respiratory infections. Bacterial pneumonia can also complicate a URI. A URI can worsen asthma and chronic obstructive pulmonary disease (COPD). Sometimes, these complications can require emergency medical care and may be life threatening. What are the causes? Almost all URIs are caused by viruses. A virus is a type of germ and can spread from one person to another. What increases the risk? You may be at risk for a URI if:  You smoke.  You have chronic heart or lung disease.  You have a weakened defense (immune) system.  You are very young or very old.  You have nasal allergies or asthma.  You work in crowded or poorly ventilated areas.  You work in health care facilities or schools.  What are the signs or symptoms? Symptoms typically develop 2-3 days after you come in contact with a cold virus. Most viral URIs last 7-10 days. However, viral URIs from the influenza virus (flu virus) can last 14-18 days and are typically more severe. Symptoms may include:  Runny or stuffy (congested) nose.  Sneezing.  Cough.  Sore throat.  Headache.  Fatigue.  Fever.  Loss of appetite.  Pain in your forehead, behind your eyes, and over your cheekbones (sinus pain).  Muscle aches.  How is this diagnosed? Your health care provider may diagnose a URI by:  Physical exam.  Tests to check that your  symptoms are not due to another condition such as: ? Strep throat. ? Sinusitis. ? Pneumonia. ? Asthma.  How is this treated? A URI goes away on its own with time. It cannot be cured with medicines, but medicines may be prescribed or recommended to relieve symptoms. Medicines may help:  Reduce your fever.  Reduce your cough.  Relieve nasal congestion.  Follow these instructions at home:  Take medicines only as directed by your health care provider.  Gargle warm saltwater or take cough drops to comfort your throat as directed by your health care provider.  Use a warm mist humidifier or inhale steam from a shower to increase air moisture. This may make it easier to breathe.  Drink enough fluid to keep your urine clear or pale yellow.  Eat soups and other clear broths and maintain good nutrition.  Rest as needed.  Return to work when your temperature has returned to normal or as your health care provider advises. You may need to stay home longer to avoid infecting others. You can also use a face mask and careful hand washing to prevent spread of the virus.  Increase the usage of your inhaler if you have asthma.  Do not use any tobacco products, including cigarettes, chewing tobacco, or electronic cigarettes. If you need help quitting, ask your health care provider. How is this prevented? The best way to protect yourself from getting a cold is to practice good hygiene.  Avoid oral or hand contact with people with cold symptoms.  Wash your   hands often if contact occurs.  There is no clear evidence that vitamin C, vitamin E, echinacea, or exercise reduces the chance of developing a cold. However, it is always recommended to get plenty of rest, exercise, and practice good nutrition. Contact a health care provider if:  You are getting worse rather than better.  Your symptoms are not controlled by medicine.  You have chills.  You have worsening shortness of breath.  You have  brown or red mucus.  You have yellow or brown nasal discharge.  You have pain in your face, especially when you bend forward.  You have a fever.  You have swollen neck glands.  You have pain while swallowing.  You have white areas in the back of your throat. Get help right away if:  You have severe or persistent: ? Headache. ? Ear pain. ? Sinus pain. ? Chest pain.  You have chronic lung disease and any of the following: ? Wheezing. ? Prolonged cough. ? Coughing up blood. ? A change in your usual mucus.  You have a stiff neck.  You have changes in your: ? Vision. ? Hearing. ? Thinking. ? Mood. This information is not intended to replace advice given to you by your health care provider. Make sure you discuss any questions you have with your health care provider. Document Released: 07/22/2000 Document Revised: 09/29/2015 Document Reviewed: 05/03/2013 Elsevier Interactive Patient Education  2017 [employer].   Otitis Media, Adult Otitis media occurs when there is inflammation and fluid in the middle ear. Your middle ear is a part of the ear that contains bones for hearing as well as air that helps send sounds to your brain. What are the causes? This condition is caused by a blockage in the eustachian tube. This tube drains fluid from the ear to the back of the nose (nasopharynx). A blockage in this tube can be caused by an object or by swelling (edema) in the tube. Problems that can cause a blockage include:  A cold or other upper respiratory infection.  Allergies.  An irritant, such as tobacco smoke.  Enlarged adenoids. The adenoids are areas of soft tissue located high in the back of the throat, behind the nose and the roof of the mouth.  A mass in the nasopharynx.  Damage to the ear caused by pressure changes (barotrauma).  What are the signs or symptoms? Symptoms of this condition include:  Ear pain.  A fever.  Decreased hearing.  A  headache.  Tiredness (lethargy).  Fluid leaking from the ear.  Ringing in the ear.  How is this diagnosed? This condition is diagnosed with a physical exam. During the exam your health care provider will use an instrument called an otoscope to look into your ear and check for redness, swelling, and fluid. He or she will also ask about your symptoms. Your health care provider may also order tests, such as:  A test to check the movement of the eardrum (pneumatic otoscopy). This test is done by squeezing a small amount of air into the ear.  A test that changes air pressure in the middle ear to check how well the eardrum moves and whether the eustachian tube is working (tympanogram).  How is this treated? This condition usually goes away on its own within 3-5 days. But if the condition is caused by a bacteria infection and does not go away own its own, or keeps coming back, your health care provider may:  Prescribe antibiotic medicines to treat  the infection.  Prescribe or recommend medicines to control pain.  Follow these instructions at home:  Take over-the-counter and prescription medicines only as told by your health care provider.  If you were prescribed an antibiotic medicine, take it as told by your health care provider. Do not stop taking the antibiotic even if you start to feel better.  Keep all follow-up visits as told by your health care provider. This is important. Contact a health care provider if:  You have bleeding from your nose.  There is a lump on your neck.  You are not getting better in 5 days.  You feel worse instead of better. Get help right away if:  You have severe pain that is not controlled with medicine.  You have swelling, redness, or pain around your ear.  You have stiffness in your neck.  A part of your face is paralyzed.  The bone behind your ear (mastoid) is tender when you touch it.  You develop a severe headache. Summary  Otitis  media is redness, soreness, and swelling of the middle ear.  This condition usually goes away on its own within 3-5 days.  If the problem does not go away in 3-5 days, your health care provider may prescribe or recommend medicines to treat your symptoms.  If you were prescribed an antibiotic medicine, take it as told by your health care provider. This information is not intended to replace advice given to you by your health care provider. Make sure you discuss any questions you have with your health care provider. Document Released: 11/01/2003 Document Revised: 01/17/2016 Document Reviewed: 01/17/2016 Elsevier Interactive Patient Education  2017 [employer].   Please take all medications as directed. Take Diflucan only if yeast infection symptoms develop. Increase fluids/rest/vit c. Alternate OTC Acetaminophen and Ibuprofen. If symptoms persist after antibiotic completed, then please call clinic. Work excuse provided-REST IT! FEEL BETTER!

## 2016-10-23 ENCOUNTER — Encounter: Payer: Self-pay | Admitting: Adult Health

## 2016-10-26 ENCOUNTER — Encounter: Payer: Self-pay | Admitting: Genetic Counselor

## 2016-10-26 ENCOUNTER — Telehealth: Payer: Self-pay | Admitting: Family Medicine

## 2016-10-26 ENCOUNTER — Encounter: Payer: Self-pay | Admitting: Adult Health

## 2016-10-26 ENCOUNTER — Other Ambulatory Visit: Payer: Self-pay | Admitting: Adult Health

## 2016-10-26 DIAGNOSIS — J069 Acute upper respiratory infection, unspecified: Secondary | ICD-10-CM

## 2016-10-26 DIAGNOSIS — Z1379 Encounter for other screening for genetic and chromosomal anomalies: Secondary | ICD-10-CM | POA: Insufficient documentation

## 2016-10-26 MED ORDER — DOXYCYCLINE HYCLATE 100 MG PO TABS: 100.0000 mg | ORAL_TABLET | Freq: Two times a day (BID) | ORAL | 0 refills | Status: DC

## 2016-10-26 NOTE — Telephone Encounter (Signed)
Patient called to respond to your message to her, she states she did not go to the UC and is still feeling bad

## 2016-10-26 NOTE — Telephone Encounter (Signed)
Pt sent MyChart message, which was forwarded to Mina Marble, NP.  Charyl Bigger, CMA

## 2016-11-12 ENCOUNTER — Ambulatory Visit (INDEPENDENT_AMBULATORY_CARE_PROVIDER_SITE_OTHER): Payer: 59 | Admitting: Neurology

## 2016-11-12 ENCOUNTER — Telehealth: Payer: Self-pay | Admitting: Neurology

## 2016-11-12 ENCOUNTER — Encounter: Payer: Self-pay | Admitting: Neurology

## 2016-11-12 VITALS — BP 115/77 | HR 72 | Resp 16 | Ht 67.0 in | Wt 137.0 lb

## 2016-11-12 DIAGNOSIS — R519 Headache, unspecified: Secondary | ICD-10-CM

## 2016-11-12 DIAGNOSIS — R000 Tachycardia, unspecified: Secondary | ICD-10-CM

## 2016-11-12 DIAGNOSIS — R55 Syncope and collapse: Secondary | ICD-10-CM

## 2016-11-12 DIAGNOSIS — G8929 Other chronic pain: Secondary | ICD-10-CM

## 2016-11-12 DIAGNOSIS — G90A Postural orthostatic tachycardia syndrome (POTS): Secondary | ICD-10-CM

## 2016-11-12 DIAGNOSIS — M5481 Occipital neuralgia: Secondary | ICD-10-CM | POA: Diagnosis not present

## 2016-11-12 DIAGNOSIS — R51 Headache: Secondary | ICD-10-CM | POA: Diagnosis not present

## 2016-11-12 DIAGNOSIS — R251 Tremor, unspecified: Secondary | ICD-10-CM | POA: Diagnosis not present

## 2016-11-12 DIAGNOSIS — I951 Orthostatic hypotension: Secondary | ICD-10-CM

## 2016-11-12 DIAGNOSIS — M542 Cervicalgia: Secondary | ICD-10-CM | POA: Diagnosis not present

## 2016-11-12 DIAGNOSIS — M797 Fibromyalgia: Secondary | ICD-10-CM

## 2016-11-12 MED ORDER — ALPRAZOLAM 0.5 MG PO TABS: ORAL_TABLET | ORAL | 0 refills | Status: DC

## 2016-11-12 MED ORDER — FLUDROCORTISONE ACETATE 0.1 MG PO TABS: 0.1000 mg | ORAL_TABLET | Freq: Every day | ORAL | 11 refills | Status: DC

## 2016-11-12 NOTE — Telephone Encounter (Signed)
10/4-Called pt to schedule EEG. LVM for pt to call back and schedule JBA

## 2016-11-12 NOTE — Telephone Encounter (Signed)
LMOM that Alprazolam rx. has been faxed to [facility].  She does not need to return this call unless she has other questions/concerns/fim

## 2016-11-12 NOTE — Telephone Encounter (Signed)
Pt is asking for a call back re: her having sever migraines.  She wanted to know if she could be seen today because she has been passing out for the last 3 days and she says she has Pots.  Pt has agreed to accept 1st avail appointment for 12-11 and is on wait list, please call if she can be seen any time before this date.  Pt advised to use ED if needed

## 2016-11-12 NOTE — Telephone Encounter (Signed)
Spoke with pt. and offered w/i 0930 this am.  She is agreeable, will be here/fim

## 2016-11-12 NOTE — Telephone Encounter (Signed)
Rx. awaiting RAS sig/fim 

## 2016-11-12 NOTE — Telephone Encounter (Signed)
The patient is scheduled for 11/17/16 at the [facility] mobile unit. She did inform me that she is slightly claustrophic and would need something to help her nerves.

## 2016-11-12 NOTE — Progress Notes (Signed)
[facility]  PATIENT: Cindy Turner DOB: Jul 23, 1985  REFERRING DOCTOR OR PCP:  Aura Dials SOURCE: Patient, ED notes, imaging reports, spine MRI images  was personally reviewed on PACS.  _________________________________   HISTORICAL  CHIEF COMPLAINT:  Chief Complaint  Patient presents with  . POTS    Increased dizziness with 3 syncopal episodes over the last 3 days.  Has been treated for a persistent uri over the last 3 wks. Sts. po fluid intake has been good. Nausea, but no vomiting or diarrhea/fim    HISTORY OF PRESENT ILLNESS:  Cindy Turner is a 31 y.o. woman with POTS and FMS.    She has had increased dizziness and also has had 3 episodes of syncope over the last 3 days but has had other episodes over the last month.   Syncope:   She has had several episodes of syncope the last 3 days.   There is no warning.     She is always standing.   She just passes out, now without presyncope first.    She then falls and has hit her head twice in last 3 falls.     In the past, syncope episodes were preceded by presyncope and she was often able to get to a chair.  She was diagnosed with possible POTS after two sets of orthostatic VS showed pulse increasing > 20 bpm.    after she has the syncope, she sweats profusely and reports worsening of her her headaches and also has had some mild confusion and problems getting words out. Her husband noted some jerking with at least one of the episodes (just for a second or two).   With some of the episodes, after she came to, she began to lightheaded and have little jerking again.    In June, she was started on fludrocortisone. She initially did better with fewer episodes of presyncope and no episodes of syncope. However, recently the benefits are not as evident. She is been off and on Copaxone for many years.  Recent head injury: She had a head injury in early June 2018 (due to tripping chasing her goat) a few months ago  associated with HA afterwards for days.  A splenius capitus/ONB shot helped the HA pain.   There was no GTC, tongue biting or loss of urine.    Orthostatic trembling:   She often notes cramping in her hands when she stands up and she walks.   This will only occur with standing and not with sitting. She has not noted any change in handwriting (no tremor with writing when sitting)    HA/neck pain:    Her headaches and neck pain were much better for about a month after the splenius capitis TPI /occipital nerve block .    The headaches are occurring daily. They are throbbing and severe in intensity.. They occur in the occiput as well as in the forehead. Bright lights and moving will make the headaches worse.   FMS:   FMS pain has done better without medication recently.     When present, pain is R > L and mostly near the spine in the upper body and in the right lower back down to the legs lower down.   Bending her ankles sometimes increase the leg pain.  Lifting increases her pain.       Insomnia:   She was sleeping better when she was not having headache or taking Flexeril at bedtime only. However, since the headaches  have returned her insomnia has become much worse.     HA History:  She reports a history of headaches since childhood. For many years the headaches were occurring 2-3 times a month and would last 1-2 days. With the headache she will get nausea, photophobia and phonophobia. Moving the head makes the pain worse. The headaches have been worsening this year. Currently she has a headache that has lasted one week. This one is associated with pain entirely on the right side that is throbbing at times. The headache started in the 4 head then went to the occiput. It intensified quite a bit at that point and shot up and down. She has taken over-the-counter anti-inflammatory medications. Additionally she went to the emergency room last week and was given Valium and oxycodone (more for her back). Those  only helped her for short period of time. She has never tried a triptan. She has never been on prophylactic medications for the headaches.  Presyncope History:  Since 2016, she has had many episodes of lightheadedness and even one episode of syncope. These episodes fluctuate quite a bit and some months have been much worse than others. 3, and these are occurring she will stand up and get lightheaded she often has to get back down to a chair because she doesn't feel safe if she would stand up for longer period of time. One time she actually passed out. Most of the time, the spells occur while she is standing but sometimes she will get a lightheaded spell while laying down and feel very numb.   She reports having a Holter monitor for a week. There was variation in her heart rate by her report but I do not have the official cardiology interpretation [facility]).   She notes some swelling in her feet, right greater than left. Sometimes the whole right leg will be more swollen.     REVIEW OF SYSTEMS: Constitutional: No fevers, chills, sweats, or change in appetite.   She notes Eyes: No visual changes, double vision, eye pain Ear, nose and throat: No hearing loss, ear pain, nasal congestion, sore throat Cardiovascular: No chest pain, palpitations Respiratory: No shortness of breath at rest or with exertion.   No wheezes GastrointestinaI: No nausea, vomiting, diarrhea, abdominal pain, fecal incontinence Genitourinary: No dysuria, urinary retention or frequency.  No nocturia. Musculoskeletal: as above Integumentary: No rash, pruritus, skin lesions Neurological: as above Psychiatric: No depression at this time.  No anxiety Endocrine: No palpitations, diaphoresis, change in appetite, change in weigh or increased thirst Hematologic/Lymphatic: No anemia, purpura, petechiae. Allergic/Immunologic: No itchy/runny eyes, nasal congestion, recent allergic reactions, rashes  ALLERGIES: Allergies    Allergen Reactions  . Terbutaline Sulfate     arrythmia  . Vitamin A     Headaches   . Latex Rash    HOME MEDICATIONS:  Current Outpatient Prescriptions:  .  cyclobenzaprine (FLEXERIL) 5 MG tablet, Take 1 tablet (5 mg total) by mouth every 8 (eight) hours as needed for muscle spasms., Disp: 30 tablet, Rfl: 5 .  ondansetron (ZOFRAN ODT) 4 MG disintegrating tablet, Take 1 tablet (4 mg total) by mouth every 8 (eight) hours as needed for nausea or vomiting., Disp: 30 tablet, Rfl: 0 .  fludrocortisone (FLORINEF) 0.1 MG tablet, Take 1 tablet (0.1 mg total) by mouth daily., Disp: 30 tablet, Rfl: 11  PAST MEDICAL HISTORY: Past Medical History:  Diagnosis Date  . Allergy   . Fibromyalgia   . Migraines   . Raynaud's disease   .  Ureterolithiasis     PAST SURGICAL HISTORY: Past Surgical History:  Procedure Laterality Date  . APPENDECTOMY    . BREAST BIOPSY    . DILATION AND CURETTAGE OF UTERUS    . kidney stent    . LAPAROSCOPY    . TONSILLECTOMY    . TUBAL LIGATION    . TUBAL LIGATION      FAMILY HISTORY: Family History  Problem Relation Age of Onset  . Hypertension Mother   . Arthritis Mother   . Breast cancer Mother 50  . Uterine cancer Mother   . Legg-Calve-Perthes disease Father   . Healthy Brother   . Asthma Daughter   . Healthy Daughter   . Breast cancer Maternal Grandmother        dx in her 57s  . Throat cancer Maternal Grandfather        smoker; dx in 10s  . Melanoma Paternal Grandfather   . Hypertension Paternal Grandfather   . Diabetes Paternal Grandfather   . Heart attack Paternal Grandfather   . Melanoma Maternal Aunt        dx in her 30s    SOCIAL HISTORY:  Social History   Social History  . Marital status: Married    Spouse name: N/A  . Number of children: N/A  . Years of education: N/A   Occupational History  . Not on file.   Social History Main Topics  . Smoking status: Never Smoker  . Smokeless tobacco: Never Used  . Alcohol use No   . Drug use: No  . Sexual activity: Yes    Partners: Male    Birth control/ protection: Surgical     Comment: tubal ligation   Other Topics Concern  . Not on file   Social History Narrative  . No narrative on file     PHYSICAL EXAM  Vitals:   11/12/16 0932  BP: 115/77  Pulse: 72  Resp: 16  Weight: 137 lb (62.1 kg)  Height: 5\' 7"  (1.702 m)   Pulse goes from 76 to 98 upon standing  Body mass index is 21.46 kg/m.   Orthostatic VS for the past 24 hrs (Last 3 readings):  BP- Lying Pulse- Lying BP- Sitting Pulse- Sitting BP- Standing at 0 minutes Pulse- Standing at 0 minutes BP- Standing at 3 minutes Pulse- Standing at 3 minutes  11/12/16 0934 115/77 72 118/77 67 125/84 73 112/73 82    General: The patient is well-developed and well-nourished and in no acute distress  Neck: The neck is supple, no carotid bruits are noted.  The neck is tender at the right > left  occiput and to a lesser extent the paraspinal muscles .   Cardiovascular: There are no carotid bruits and normal pulses. Heart has a regular rate and rhythm normal S1 and S2 no murmurs, rubs. Lungs are clear to auscultation.  Extremities: There are no rashes. Joints are not inflamed.  Neurologic Exam  Mental status: The patient is alert and oriented x 3 at the time of the examination. The patient has apparent normal recent and remote memory, with an apparently normal attention span and concentration ability.   Speech is normal.  Cranial nerves: Extraocular movements are full.  There is good facial sensation to soft touch bilaterally.Facial strength is normal.  Trapezius and sternocleidomastoid strength is normal. No dysarthria is noted.  The tongue is midline, and the patient has symmetric elevation of the soft palate. No obvious hearing deficits are noted.  Motor:  She  has a right greater than left rest tremor that worsens when she stands up Muscle bulk is normal.   Tone is normal. Strength is  5 / 5 in all 4  extremities.   Sensory: Sensory testing is intact to touch in all 4 extremities.  Coordination: Cerebellar testing reveals good finger-nose-finger  bilaterally.  Gait and station: Station is normal.   Gait is mildly wide and she has difficulty doing a tandem..    Reflexes: Deep tendon reflexes are symmetric and normal bilaterally.       DIAGNOSTIC DATA (LABS, IMAGING, TESTING) - I reviewed patient records, labs, notes, testing and imaging myself where available.  Lab Results  Component Value Date   WBC 5.6 07/09/2016   HGB 13.5 07/09/2016   HCT 41.2 07/09/2016   MCV 77 (L) 07/09/2016   PLT 244 07/09/2016      Component Value Date/Time   NA 138 07/09/2016 1507   K 4.6 07/09/2016 1507   CL 102 07/09/2016 1507   CO2 22 07/09/2016 1507   GLUCOSE 78 07/09/2016 1507   GLUCOSE 84 11/19/2015 0243   BUN 12 07/09/2016 1507   CREATININE 0.65 07/09/2016 1507   CALCIUM 9.8 07/09/2016 1507   PROT 7.7 07/09/2016 1507   ALBUMIN 4.9 07/09/2016 1507   AST 16 07/09/2016 1507   ALT 15 07/09/2016 1507   ALKPHOS 54 07/09/2016 1507   BILITOT 1.4 (H) 07/09/2016 1507   GFRNONAA 118 07/09/2016 1507   GFRAA 136 07/09/2016 1507    Lab Results  Component Value Date   TSH 0.802 07/09/2016       ASSESSMENT AND PLAN  POTS (postural orthostatic tachycardia syndrome)  Fibromyalgia  Occipital neuralgia of right side  Neck pain  Syncope, unspecified syncope type - Plan: EEG adult, Ambulatory referral to Cardiology, HOLTER MONITOR - 48 HOUR  Tremor - Plan: EEG adult  Chronic intractable headache, unspecified headache type - Plan: MR BRAIN W WO CONTRAST   1.   She has had multiple episodes of syncope, some associated with jerking and also an increase in headache and mildly off balance gait.. Although the jerking would be more likely to be myoclonus than seizure, we do need to check an EEG to make sure that she is not susceptible to seizures and an MRI to make sure that her spells are  not due to stroke, inflammation or mass lesion.. Additionally, we'll have her see cardiology and we will check a Holter monitor. She may need a tilt table test but I would want her to see cardiology first.  2.    Trigger point injections into bilateral splenius capitus muscles and trapezius muscles using 80 mg Depo-Medrol and sterile technique.   She tolerated the injections well and there were no complications. Pain was better afterwards.  3.    Continue fludrocortisone 0.1 mg daily. If her resting tremor worsens, consider a beta blocker. 4.    She will return to see me in 4 months or sooner if there are new or worsening neurologic symptoms.   Eeg/cards/holter/tpi Richard A. Felecia Shelling, MD, PhD 41/10/3788, 24:09 AM Certified in Neurology, Clinical Neurophysiology, Sleep Medicine, Pain Medicine and Neuroimaging  [facility] [address] [place], [place] [address] [phone]   `

## 2016-11-13 ENCOUNTER — Encounter: Payer: Self-pay | Admitting: Neurology

## 2016-11-17 ENCOUNTER — Observation Stay (HOSPITAL_COMMUNITY)
Admission: EM | Admit: 2016-11-17 | Discharge: 2016-11-18 | Disposition: A | Discharge status: Home / Self Care | Payer: 59 | Attending: Family Medicine | Admitting: Family Medicine

## 2016-11-17 ENCOUNTER — Ambulatory Visit (INDEPENDENT_AMBULATORY_CARE_PROVIDER_SITE_OTHER): Payer: 59

## 2016-11-17 ENCOUNTER — Encounter (HOSPITAL_COMMUNITY): Payer: Self-pay | Admitting: Emergency Medicine

## 2016-11-17 DIAGNOSIS — R51 Headache: Secondary | ICD-10-CM

## 2016-11-17 DIAGNOSIS — M797 Fibromyalgia: Secondary | ICD-10-CM | POA: Diagnosis not present

## 2016-11-17 DIAGNOSIS — F449 Dissociative and conversion disorder, unspecified: Secondary | ICD-10-CM | POA: Diagnosis not present

## 2016-11-17 DIAGNOSIS — I7300 Raynaud's syndrome without gangrene: Secondary | ICD-10-CM | POA: Diagnosis not present

## 2016-11-17 DIAGNOSIS — F4321 Adjustment disorder with depressed mood: Secondary | ICD-10-CM | POA: Diagnosis not present

## 2016-11-17 DIAGNOSIS — Z79899 Other long term (current) drug therapy: Secondary | ICD-10-CM | POA: Insufficient documentation

## 2016-11-17 DIAGNOSIS — Z23 Encounter for immunization: Secondary | ICD-10-CM | POA: Insufficient documentation

## 2016-11-17 DIAGNOSIS — F4323 Adjustment disorder with mixed anxiety and depressed mood: Secondary | ICD-10-CM | POA: Diagnosis present

## 2016-11-17 DIAGNOSIS — R519 Headache, unspecified: Secondary | ICD-10-CM

## 2016-11-17 DIAGNOSIS — R55 Syncope and collapse: Secondary | ICD-10-CM | POA: Diagnosis not present

## 2016-11-17 DIAGNOSIS — F429 Obsessive-compulsive disorder, unspecified: Secondary | ICD-10-CM | POA: Diagnosis present

## 2016-11-17 DIAGNOSIS — G90A Postural orthostatic tachycardia syndrome (POTS): Secondary | ICD-10-CM | POA: Diagnosis present

## 2016-11-17 DIAGNOSIS — R000 Tachycardia, unspecified: Secondary | ICD-10-CM | POA: Diagnosis not present

## 2016-11-17 DIAGNOSIS — M5481 Occipital neuralgia: Secondary | ICD-10-CM | POA: Diagnosis not present

## 2016-11-17 DIAGNOSIS — G43909 Migraine, unspecified, not intractable, without status migrainosus: Secondary | ICD-10-CM | POA: Insufficient documentation

## 2016-11-17 DIAGNOSIS — I951 Orthostatic hypotension: Secondary | ICD-10-CM | POA: Diagnosis not present

## 2016-11-17 DIAGNOSIS — G43709 Chronic migraine without aura, not intractable, without status migrainosus: Secondary | ICD-10-CM | POA: Diagnosis present

## 2016-11-17 DIAGNOSIS — IMO0002 Reserved for concepts with insufficient information to code with codable children: Secondary | ICD-10-CM | POA: Diagnosis present

## 2016-11-17 DIAGNOSIS — R52 Pain, unspecified: Secondary | ICD-10-CM | POA: Diagnosis not present

## 2016-11-17 DIAGNOSIS — G4489 Other headache syndrome: Secondary | ICD-10-CM | POA: Diagnosis not present

## 2016-11-17 LAB — COMPREHENSIVE METABOLIC PANEL
ALT: 10 U/L — ABNORMAL LOW (ref 14–54)
AST: 20 U/L (ref 15–41)
Albumin: 3.4 g/dL — ABNORMAL LOW (ref 3.5–5.0)
Alkaline Phosphatase: 42 U/L (ref 38–126)
Anion gap: 9 (ref 5–15)
BUN: 10 mg/dL (ref 6–20)
CHLORIDE: 107 mmol/L (ref 101–111)
CO2: 21 mmol/L — AB (ref 22–32)
Calcium: 8 mg/dL — ABNORMAL LOW (ref 8.9–10.3)
Creatinine, Ser: 0.59 mg/dL (ref 0.44–1.00)
GFR calc Af Amer: >60 mL/min (ref >60)
Glucose, Bld: 73 mg/dL (ref 65–99)
POTASSIUM: 3.6 mmol/L (ref 3.5–5.1)
SODIUM: 137 mmol/L (ref 135–145)
Total Bilirubin: 0.8 mg/dL (ref 0.3–1.2)
Total Protein: 5.6 g/dL — ABNORMAL LOW (ref 6.5–8.1)

## 2016-11-17 LAB — CBC WITH DIFFERENTIAL/PLATELET
Basophils Absolute: 0.1 10*3/uL (ref 0.0–0.1)
Basophils Relative: 1 %
EOS PCT: 2 %
Eosinophils Absolute: 0.1 10*3/uL (ref 0.0–0.7)
HCT: 37.7 % (ref 36.0–46.0)
HEMOGLOBIN: 12.5 g/dL (ref 12.0–15.0)
LYMPHS ABS: 1.5 10*3/uL (ref 0.7–4.0)
LYMPHS PCT: 25 %
MCH: 25.1 pg — AB (ref 26.0–34.0)
MCHC: 33.2 g/dL (ref 30.0–36.0)
MCV: 75.6 fL — AB (ref 78.0–100.0)
Monocytes Absolute: 0.6 10*3/uL (ref 0.1–1.0)
Monocytes Relative: 10 %
NEUTROS PCT: 62 %
Neutro Abs: 3.8 10*3/uL (ref 1.7–7.7)
Platelets: 193 10*3/uL (ref 150–400)
RBC: 4.99 MIL/uL (ref 3.87–5.11)
RDW: 14.2 % (ref 11.5–15.5)
WBC: 6 10*3/uL (ref 4.0–10.5)

## 2016-11-17 LAB — CBG MONITORING, ED: GLUCOSE-CAPILLARY: 85 mg/dL (ref 65–99)

## 2016-11-17 MED ORDER — FLUDROCORTISONE ACETATE 0.1 MG PO TABS
0.1000 mg | ORAL_TABLET | Freq: Every day | ORAL | Status: DC
  Administered 2016-11-17 – 2016-11-18 (×2): 0.1 mg via ORAL
  Filled 2016-11-17 (×3): qty 1

## 2016-11-17 MED ORDER — SODIUM CHLORIDE 0.9% FLUSH: 3.0000 mL | INTRAVENOUS | Status: DC | PRN

## 2016-11-17 MED ORDER — SODIUM CHLORIDE 0.9% FLUSH
3.0000 mL | Freq: Two times a day (BID) | INTRAVENOUS | Status: DC
  Administered 2016-11-17 – 2016-11-18 (×2): 3 mL via INTRAVENOUS

## 2016-11-17 MED ORDER — ONDANSETRON 4 MG PO TBDP
4.0000 mg | ORAL_TABLET | Freq: Three times a day (TID) | ORAL | Status: DC | PRN
  Filled 2016-11-17: qty 1

## 2016-11-17 MED ORDER — INFLUENZA VAC SPLIT QUAD 0.5 ML IM SUSY
0.5000 mL | PREFILLED_SYRINGE | INTRAMUSCULAR | Status: AC
Start: 2016-11-18 — End: 2016-11-18
  Administered 2016-11-18: 0.5 mL via INTRAMUSCULAR
  Filled 2016-11-17: qty 0.5

## 2016-11-17 MED ORDER — GADOPENTETATE DIMEGLUMINE 469.01 MG/ML IV SOLN: 12.0000 mL | Freq: Once | INTRAVENOUS | Status: DC | PRN

## 2016-11-17 MED ORDER — CYCLOBENZAPRINE HCL 10 MG PO TABS
5.0000 mg | ORAL_TABLET | Freq: Three times a day (TID) | ORAL | Status: DC | PRN
  Administered 2016-11-17: 5 mg via ORAL
  Filled 2016-11-17: qty 1

## 2016-11-17 MED ORDER — LORAZEPAM 2 MG/ML IJ SOLN
INTRAMUSCULAR | Status: AC
  Administered 2016-11-17: 1 mg
  Filled 2016-11-17: qty 1

## 2016-11-17 MED ORDER — SODIUM CHLORIDE 0.9 % IV SOLN: 250.0000 mL | INTRAVENOUS | Status: DC | PRN

## 2016-11-17 NOTE — ED Triage Notes (Signed)
Per EMS: Pt had a MRI early today for recurring headaches x 1 month.  Pt's husband found her at home, extremely lethargic, and stating she needed to use the bathroom but couldn't walk.  Pt's husband got her to the bathroom, pt had a syncopal episode and pt's husband called EMS.  Pt took 2 Xanax prior to having MRI which is a new med for her. Pt states right side head pain with numbness.  Pt has a hx of POTS.

## 2016-11-17 NOTE — ED Notes (Signed)
Patient urinated 234ml through pure wick, urine opaque yellow in color, but very cloudy

## 2016-11-17 NOTE — ED Notes (Signed)
While pl;acing pt on monitor she became unresponsive and pupils checked and noticed eye deviation. RN made aware and EDP notified.

## 2016-11-17 NOTE — ED Provider Notes (Signed)
[facility] DEPT Provider Note   CSN: [id number] Arrival date & time: 11/17/16  1430     History   Chief Complaint Chief Complaint  Patient presents with  . Headache  . Loss of Consciousness    HPI Cindy Turner is a 31 y.o. female.  The history is provided by the patient and medical records. No language interpreter was used.  Headache   Associated symptoms include syncope.  Loss of Consciousness   Associated symptoms include headaches.   Cindy Turner is a 31 y.o. female  with a PMH of POTS, fibromyalgia, chronic migraines who presents to the Emergency Department for evaluation of numerous possible syncopal episodes just prior to arrival. The majority of the history is obtained by husband at bedside. He states that patient did go for an MRI ordered by [facility] as an outpatient. She did take two xanax prior to the imaging. She came home and per husband, was a little tired, but fine. She then said she needed to use the restroom, but didn't feel like she could get up. Husband helped her to the restroom. He noted that she was "a little wobbly" on her feet. He saw a spider on the wall and let go of her to kill the spider and in that timeframe, patient had syncopal episode. Initially, husband thought this was similar to her POTS episodes. He states that she typically will pass out and return to baseline in a few minutes. She did return to baseline for a few seconds, saying that her head hurt, then had another syncopal episode. She has been having syncopal episodes back-to-back since. Husband states that she will gasp for air, say a few words, then pass out again. During the few intermittent times when she will answer questions, she complains of right-sided headache and flashes of light.   Level V caveat applies 2/2 acuity of condition.   Past Medical History:  Diagnosis Date  . Allergy   . Fibromyalgia   . Migraines   . Raynaud's disease   . Ureterolithiasis      Patient Active Problem List   Diagnosis Date Noted  . Genetic testing 10/26/2016  . Nausea and vomiting 10/19/2016  . URI, acute 10/19/2016  . Right otitis media 10/19/2016  . Tremor 07/16/2016  . Syncope 07/16/2016  . Head injury 07/16/2016  . Rib contusion, right, sequela 07/09/2016  . Chronic migraine 04/22/2016  . Chronic pelvic pain in female 01/17/2016  . Endometriosis of pelvic peritoneum 01/17/2016  . Hx of tubal ligation 01/17/2016  . Vaginitis 01/17/2016  . Dysuria 01/16/2016  . Acute bilateral low back pain 01/16/2016  . Painless hematuria 01/16/2016  . Irregular periods 01/16/2016  . Right shoulder injury- ortho 12/25/2015  . Family history of diabetes mellitus 12/25/2015  . OCD (obsessive compulsive disorder) 12/25/2015  . Adjustment disorder with mixed anxiety and depressed mood 12/25/2015  . Family history of breast cancer in first degree relative- mother age 23 12/25/2015  . Family history of uterine cancer- Mom age 70 12/25/2015  . Family history of malignant melanoma of skin- in 30's 12/25/2015  . Common migraine without intractability 11/25/2015  . Occipital neuralgia of right side 11/25/2015  . Neck pain 11/25/2015  . POTS (postural orthostatic tachycardia syndrome) 11/25/2015  . Lightheadedness 11/25/2015  . Insomnia 11/25/2015  . Periodic limb movement disorder (PLMD) 11/25/2015  . Right wrist injury and R shoulder- injured at work- ortho 09/11/2015  . 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 Fibromyalgia 05/16/2011  .  Abdominal pain, acute, right upper quadrant 09/01/2010    Past Surgical History:  Procedure Laterality Date  . APPENDECTOMY    . BREAST BIOPSY    . DILATION AND CURETTAGE OF UTERUS    . kidney stent    . LAPAROSCOPY    . TONSILLECTOMY    . TUBAL LIGATION    . TUBAL LIGATION      OB History    Gravida Para Term Preterm AB Living   3 2 2   1 3    SAB TAB Ectopic Multiple Live Births   1               Home Medications     Prior to Admission medications   Medication Sig Start Date End Date Taking? Authorizing Provider  ALPRAZolam (XANAX) 0.5 MG tablet Take 1 tablet 30 min. before MRI. Take the 2nd tablet with you and take it if needed.  You must not drive or operate dangerous equipment after taking this medication 11/12/16   Sater, Nanine Means, MD  chlorpheniramine-HYDROcodone (TUSSIONEX) 10-8 MG/5ML SUER Take 5 mLs by mouth every 12 (twelve) hours as needed for cough. 10/19/16   [provider]  cyclobenzaprine (FLEXERIL) 5 MG tablet Take 1 tablet (5 mg total) by mouth every 8 (eight) hours as needed for muscle spasms. 04/22/16   Sater, Nanine Means, MD  fludrocortisone (FLORINEF) 0.1 MG tablet Take 1 tablet (0.1 mg total) by mouth daily. 11/12/16   Sater, Nanine Means, MD  ondansetron (ZOFRAN ODT) 4 MG disintegrating tablet Take 1 tablet (4 mg total) by mouth every 8 (eight) hours as needed for nausea or vomiting. 10/19/16   DanfordBerna Spare, NP    Family History Family History  Problem Relation Age of Onset  . Hypertension Mother   . Arthritis Mother   . Breast cancer Mother 60  . Uterine cancer Mother   . Legg-Calve-Perthes disease Father   . Healthy Brother   . Asthma Daughter   . Healthy Daughter   . Breast cancer Maternal Grandmother        dx in her 46s  . Throat cancer Maternal Grandfather        smoker; dx in 55s  . Melanoma Paternal Grandfather   . Hypertension Paternal Grandfather   . Diabetes Paternal Grandfather   . Heart attack Paternal Grandfather   . Melanoma Maternal Aunt        dx in her 28s    Social History Social History  Substance Use Topics  . Smoking status: Never Smoker  . Smokeless tobacco: Never Used  . Alcohol use No     Allergies   Pineapple; Terbutaline sulfate; Vitamin a; and Latex   Review of Systems Review of Systems  Unable to perform ROS: Acuity of condition  Cardiovascular: Positive for syncope.  Neurological: Positive for syncope and headaches.      Physical Exam Updated Vital Signs BP 116/89   Pulse 84   Temp (!) 97.3 F (36.3 C) (Oral)   Resp 12   LMP  (LMP Unknown)   SpO2 100%   Physical Exam  Constitutional: She appears well-developed and well-nourished. No distress.  HENT:  Head: Normocephalic and atraumatic.  Eyes: Pupils are equal, round, and reactive to light. EOM are normal.  Neck: Neck supple.  Cardiovascular: Normal rate, regular rhythm and normal heart sounds.   No murmur heard. Pulmonary/Chest: Effort normal and breath sounds normal. No respiratory distress.  Abdominal: Soft. She exhibits no distension. There is no  tenderness.  Musculoskeletal:  Will move all extremities spontaneously.  Neurological: She is alert.  Able to follow commands. During periods of alertness, she is oriented x 4. Good grip strength bilaterally.  Responds to painful stimuli in all four extremities.  Skin: Skin is warm and dry.  Nursing note and vitals reviewed.    ED Treatments / Results  Labs (all labs ordered are listed, but only abnormal results are displayed) Labs Reviewed  COMPREHENSIVE METABOLIC PANEL - Abnormal; Notable for the following:       Result Value   CO2 21 (*)    Calcium 8.0 (*)    Total Protein 5.6 (*)    Albumin 3.4 (*)    ALT 10 (*)    All other components within normal limits  CBC WITH DIFFERENTIAL/PLATELET - Abnormal; Notable for the following:    MCV 75.6 (*)    MCH 25.1 (*)    All other components within normal limits  CBC WITH DIFFERENTIAL/PLATELET  CBG MONITORING, ED    EKG  EKG Interpretation  Date/Time:  Tuesday November 17 2016 14:38:08 EDT Ventricular Rate:  92 PR Interval:    QRS Duration: 108 QT Interval:  361 QTC Calculation: 447 R Axis:   96 Text Interpretation:  Sinus rhythm Probable left atrial enlargement Consider right ventricular hypertrophy No significant change since last tracing Confirmed by Blanchie Dessert ([id number]) on 11/17/2016 2:41:51 PM       Radiology Mr  Jeri Cos [facility] Contrast  Result Date: 11/17/2016  [facility] [address] [place], [place] [address] [phone] NEUROIMAGING REPORT STUDY DATE: 11/17/2016 PATIENT NAME: Cindy Turner DOB: 07-26-1985 MRN: [id number] EXAM: MRI Brain with and without contrast ORDERING CLINICIAN: Richard A. Sater, MD. PhD CLINICAL HISTORY: 31 year old woman with headaches and history of head trauma COMPARISON FILMS: None TECHNIQUE: MRI of the brain with and without contrast was obtained utilizing 5 mm axial slices with T1, T2, T2 flair, T2 star gradient echo and diffusion weighted views.  T1 sagittal, T2 coronal and postcontrast views in the axial and coronal plane were obtained. CONTRAST: 12 ml Magnevist IMAGING SITE: [facility], [address] FINDINGS: On sagittal images, the spinal cord is imaged caudally to C4 and is normal in caliber.   The contents of the posterior fossa are of normal size and position.   The pituitary gland and optic chiasm appear normal.    Brain volume appears normal.   The ventricles are normal in size and without distortion.  There are no abnormal extra-axial collections of fluid.  The cerebellum and brainstem appears normal.   The deep gray matter appears normal. There is an expanded Virchow-Robin space in the lentiform nucleus on the left, a normal finding.  The cerebral hemispheres appear normal.  Diffusion weighted images are normal.  Gradient echo heme weighted images are normal.  The orbits appear normal.   The VIIth/VIIIth nerve complex appears normal.  The mastoid air cells appear normal.  The paranasal sinuses appear normal.  Flow voids are identified within the major intracerebral arteries.   After the infusion of contrast material, a normal enhancement pattern is noted.    This is a normal MRI of the brain with and without contrast. INTERPRETING PHYSICIAN: Richard A. Felecia Shelling, MD, PhD, FAAN Certified in  Neuroimaging by [employer]  Neuroimaging    Procedures Procedures (including critical care time)  Medications Ordered in ED Medications  LORazepam (ATIVAN) 2 MG/ML injection (1 mg  Given 11/17/16 1456)  Initial Impression / Assessment and Plan / ED Course  I have reviewed the triage vital signs and the nursing notes.  Pertinent labs & imaging results that were available during my care of the patient were reviewed by me and considered in my medical decision making (see chart for details).    MARENE GILLIAM is a 32 y.o. female with hx of POTS and chronic headaches who presents to ED for multiple syncopal episodes today. Followed by neurology and had outpatient MRI this morning. Spoke with patient's neurologist, Dr. Felecia Shelling, who urgently read imaging. MRI normal. Recommended possible EEG and tilt-testing for further evaluation of this episodes. Patient seen by neurohospitalist who recommends medical admission with EEG in the morning. Cardiology, Dr. Oval Linsey consulted. Cards to see patient. Hospitalist consulted who will admit.   Patient discussed with Dr. Darl Householder who agrees with treatment plan.   Final Clinical Impressions(s) / ED Diagnoses   Final diagnoses:  Syncope and collapse    New Prescriptions New Prescriptions   No medications on file     Ward, Ozella Almond, PA-C 11/17/16 1832    Drenda Freeze, MD 11/17/16 [phone]

## 2016-11-17 NOTE — Consult Note (Signed)
I was called to consult on Cindy Turner for recurrent syncope/seizures.  While in the room she had several prototypical events.  She was shaking with an uncoordinated tremor, eyes closed.  When I introduced myself she opened her eyes, stopped tremoring and said, "nice to meet you, but I can't see your face." She thrashed around in an uncoordinated effort trying to shake my hand.  She then closed her eyes and started tremoring again.  When I asked how she was diagnosed with POTS, she was able to correct her husband and tell us the name of the cardiologist she has seen as an outpatient (Dr. Einar Gip).  There was no loss of bowel/bladder function or post-ictal confusion.  She has been on telemetry and only sinus rhythm has been observed.  Her orthostatic vital signs are completely unremarkable.  This is NOT consistent with a cardiac etiology and I would not advise calling it syncope.  Pursuing additional cardiac testing would be a misuse of resources.  I would not get an echo or an ambulatory monitor given that there is no evidence of heart failure and her rhythm has been observed during an episode.  I would not recommend outpatient cardiology follow up and I do not think she has POTS.  Would consider psychiatric evaluation.  Her symptoms are not consistent with any cardiovascular pathology and seem inconsistent with a neurologic cause as well.     Vadie Principato C. Oval Linsey, MD, [facility]  11/17/2016 7:00 PM

## 2016-11-17 NOTE — ED Notes (Signed)
Got patient into a gown on the monitor patient is resting with family at bedside did ekg shown to Dr Maryan Rued patient has call bell in reach

## 2016-11-17 NOTE — Consult Note (Signed)
Neurology Consultation  Reason for Consult: headache, LOC Referring Physician: Dr. Inocencio Homes Ward  CC: headache and loss of consciousness  History is obtained from:patient, her husband, chart  HPI: Cindy Turner is a 31 y.o. female was a past medical history of fiber myalgia, migraines, Raynaud's disease, POTS, who has hadfrequently increasing episodes of dizziness and syncope over the past few months. She came to the ER today because these episodes have become as frequent as 10-20 times an hour. Husband reports that she has been extremely stressed, especially with parents marital problems. She denies any preceding illnesses. 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 Note chest pain or shortness of breath. She has these episodes where she becomes unresponsive after telling you that she has not become unresponsive. She has 4 can be described as fluttering of both her arms before she passes out, becomes apneic for a small duration of time while her vitals remained stable and then comes about without any clear postictal state. She has been evaluated by outpatient neurologist, who was considered EEG for seizure as etiology for these symptoms. During the encounter, as I was speaking with her, she had multiples of these episodes on, on my observation, they look very psychogenic in nature. Her eyes deviated to either left or right, she says "I am going to pass out", then she has an episode of unresponsiveness during which her vitals remained stable, which lasted anywhere from 15 seconds to 45 seconds, and then she comes about.  ROS: A 14 point ROS was performed and is negative except as noted in the HPI. Past Medical History:  Diagnosis Date  . Allergy   . Fibromyalgia   . Migraines   . Raynaud's disease   . Ureterolithiasis    Family History  Problem Relation Age of Onset  . Hypertension Mother   . Arthritis Mother   . Breast cancer Mother 63  . Uterine cancer  Mother   . Legg-Calve-Perthes disease Father   . Healthy Brother   . Asthma Daughter   . Healthy Daughter   . Breast cancer Maternal Grandmother        dx in her 49s  . Throat cancer Maternal Grandfather        smoker; dx in 103s  . Melanoma Paternal Grandfather   . Hypertension Paternal Grandfather   . Diabetes Paternal Grandfather   . Heart attack Paternal Grandfather   . Melanoma Maternal Aunt        dx in her 45s    Social History:   reports that she has never smoked. She has never used smokeless tobacco. She reports that she does not drink alcohol or use drugs.  Medications No current facility-administered medications for this encounter.   Current Outpatient Prescriptions:  .  ALPRAZolam (XANAX) 0.5 MG tablet, Take 1 tablet 30 min. before MRI. Take the 2nd tablet with you and take it if needed.  You must not drive or operate dangerous equipment after taking this medication, Disp: 2 tablet, Rfl: 0 .  chlorpheniramine-HYDROcodone (TUSSIONEX) 10-8 MG/5ML SUER, Take 5 mLs by mouth every 12 (twelve) hours as needed for cough., Disp: , Rfl: 0 .  cyclobenzaprine (FLEXERIL) 5 MG tablet, Take 1 tablet (5 mg total) by mouth every 8 (eight) hours as needed for muscle spasms., Disp: 30 tablet, Rfl: 5 .  fludrocortisone (FLORINEF) 0.1 MG tablet, Take 1 tablet (0.1 mg total) by mouth daily., Disp: 30 tablet, Rfl: 11 .  ondansetron (ZOFRAN ODT)  4 MG disintegrating tablet, Take 1 tablet (4 mg total) by mouth every 8 (eight) hours as needed for nausea or vomiting., Disp: 30 tablet, Rfl: 0  Facility-Administered Medications Ordered in Other Encounters:  .  gadopentetate dimeglumine (MAGNEVIST) injection 12 mL, 12 mL, Intravenous, Once PRN, Sater, Nanine Means, MD   Exam: Current vital signs: BP 127/80   Pulse 97   Temp (!) 97.3 F (36.3 C) (Oral)   Resp 15   LMP  (LMP Unknown)   SpO2 99%  Vital signs in last 24 hours: Temp:  [97.3 F (36.3 C)] 97.3 F (36.3 C) (10/09 1442) Pulse Rate:   [78-102] 97 (10/09 1530) Resp:  [15-33] 15 (10/09 1530) BP: (126-147)/(78-96) 127/80 (10/09 1530) SpO2:  [99 %-100 %] 99 % (10/09 1530)  GENERAL: intermittently awake and intermittently exhibiting episodes of unresponsiveness HEENT: - Normocephalic and atraumatic, dry mm, no LN++, no Thyromegally LUNGS - Clear to auscultation bilaterally with no wheezes CV - S1S2 RRR, no m/r/g, equal pulses bilaterally. ABDOMEN - Soft, nontender, nondistended with normoactive BS Ext: warm, well perfused, intact peripheral pulses, no edema  NEURO:  Mental Status: intermittently awake and intermittently gets unresponsive with normal vitals.while awake, is fully oriented. Language: speech is clear.  Naming, repetition, fluency, and comprehension intact. Cranial Nerves: PERRL 39mm/brisk. EOMI, visual fields full, no facial asymmetry, facial sensation intact, hearing intact, tongue/uvula/soft palate midline, normal sternocleidomastoid and trapezius muscle strength. No evidence of tongue atrophy or fibrillations Motor: 5/5 all over Tone: is normal and bulk is normal Sensation- Intact to light touch bilaterally Coordination: FTN intact bilaterally, no ataxia in BLE. Gait- deferred   Labs I have reviewed labs in epic and the results pertinent to this consultation are:  CBC    Component Value Date/Time   WBC 5.6 07/09/2016 1507   WBC 4.8 11/14/2015 2340   RBC 5.35 (H) 07/09/2016 1507   RBC 5.02 11/14/2015 2340   HGB 13.5 07/09/2016 1507   HCT 41.2 07/09/2016 1507   PLT 244 07/09/2016 1507   MCV 77 (L) 07/09/2016 1507   MCH 25.2 (L) 07/09/2016 1507   MCH 25.7 (L) 11/14/2015 2340   MCHC 32.8 07/09/2016 1507   MCHC 33.6 11/14/2015 2340   RDW 15.1 07/09/2016 1507   LYMPHSABS 1.1 07/09/2016 1507   MONOABS 0.4 11/14/2015 2340   EOSABS 0.1 07/09/2016 1507   BASOSABS 0.1 07/09/2016 1507    CMP     Component Value Date/Time   NA 137 11/17/2016 1532   NA 138 07/09/2016 1507   K 3.6 11/17/2016 1532    CL 107 11/17/2016 1532   CO2 21 (L) 11/17/2016 1532   GLUCOSE 73 11/17/2016 1532   BUN 10 11/17/2016 1532   BUN 12 07/09/2016 1507   CREATININE 0.59 11/17/2016 1532   CALCIUM 8.0 (L) 11/17/2016 1532   PROT 5.6 (L) 11/17/2016 1532   PROT 7.7 07/09/2016 1507   ALBUMIN 3.4 (L) 11/17/2016 1532   ALBUMIN 4.9 07/09/2016 1507   AST 20 11/17/2016 1532   ALT 10 (L) 11/17/2016 1532   ALKPHOS 42 11/17/2016 1532   BILITOT 0.8 11/17/2016 1532   BILITOT 1.4 (H) 07/09/2016 1507   GFRNONAA >60 11/17/2016 1532   GFRAA >60 11/17/2016 1532    Lipid Panel     Component Value Date/Time   CHOL 125 07/09/2016 1507   TRIG 44 07/09/2016 1507   HDL 60 07/09/2016 1507   CHOLHDL 2.1 07/09/2016 1507   LDLCALC 56 07/09/2016 1507     Imaging  I have reviewed the images obtained:  MRI examination of the brain which was done today at [facility] neurology was reportedly completely normal. I have reviewed the images and concur.  Assessment:  31 year old woman with a past medical history of fibromyalgia migraines brain AND postural ORTHOSTATIC tachycardia syndrome (POTS), presents with increasingly worsening frequency of syncopal episodes. I witnessed multiple of these events while I was at bedside. The description as above. In my opinion, these episodes had a huge amount of functional overlay and appeared very nonphysiological.  Outpatient neurologist is concerning for possible seizuresand was planning to do an EEG. I would recommend admission for some workup as she continues to be not returning back to her baseline.  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 I would recommend outpatient  cardiology follow-up. I have spoken to the patient and family about getting psychotherapy and finding a therapist. I would encourage therapy referral. Please call neurology if the EEG shows any abnormality.  No further inpatient neurology recommendations at this time. Patient should follow with outpatient neurology as planned.  Please call neurology with questions   Amie Portland, MD [facility] [phone]  If 7pm to 7am, please call on call as listed on AMION.

## 2016-11-17 NOTE — H&P (Signed)
History and Physical    Cindy Turner ZOX:[id number] DOB: 21-Sep-1985 DOA: 11/17/2016  PCP: Mellody Dance, DO  Patient coming from:  home  Chief Complaint:  Spells of passing out  HPI: Cindy Turner is a 31 y.o. female with medical history significant of fibromyalgia, migraines, Potts, raynauds comes in with more frequent episodes of "passing out".  Pt reports this has been happening for ten years but has been much more frequent the last several months.  It is now occurring many times a day.  An episode actually occurred while me and the cardiology APP were in the room.  She was telling us what occurs with her husband in the room.  Before the episode occurs she gets this right sided face numbness and her arms start to tingle and she know she is about "to go down".  She lies down , passes out for several minutes than comes too.  Her husband has witnessed these and reports her eyes will start to get strange rolling back and then she starts to twitch all over.  Then she suddenly wakes up.  He reports she is not confused after these spells, she quickly comes too.  She has no urinary or bowel incontinence during these episodes.  She reports she has a severe migraine after they occur.  No fevers.  No n/v/d.   She does not hurt herself during these episodes and there is no tongue injury during any of these episodes.  She usually feels palpitations during it.  During our interview she had one of these where she said "its happening"  Her eyes rolled over and she started twitching her hand, her vitals all did not change during the episode, it lasted less than 2 minutes than she suddenly woke up in bed alert.  Pt is being referred for admission for possible seizures.  During the episode she did not respond to painful stimuli.   Review of Systems: As per HPI otherwise 10 point review of systems negative.   Past Medical History:  Diagnosis Date  . Allergy   . Fibromyalgia   . Migraines   .  Raynaud's disease   . Ureterolithiasis     Past Surgical History:  Procedure Laterality Date  . APPENDECTOMY    . BREAST BIOPSY    . DILATION AND CURETTAGE OF UTERUS    . kidney stent    . LAPAROSCOPY    . TONSILLECTOMY    . TUBAL LIGATION    . TUBAL LIGATION       reports that she has never smoked. She has never used smokeless tobacco. She reports that she does not drink alcohol or use drugs.  Allergies  Allergen Reactions  . Pineapple Anaphylaxis, Itching and Rash  . Terbutaline Sulfate Other (See Comments)    Arrhythmia  . Vitamin A Other (See Comments)    Headaches   . Latex Rash    Family History  Problem Relation Age of Onset  . Hypertension Mother   . Arthritis Mother   . Breast cancer Mother 41  . Uterine cancer Mother   . Legg-Calve-Perthes disease Father   . Healthy Brother   . Asthma Daughter   . Healthy Daughter   . Breast cancer Maternal Grandmother        dx in her 34s  . Throat cancer Maternal Grandfather        smoker; dx in 36s  . Melanoma Paternal Grandfather   . Hypertension Paternal Grandfather   . Diabetes  Paternal Grandfather   . Heart attack Paternal Grandfather   . Melanoma Maternal Aunt        dx in her 33s    Prior to Admission medications   Medication Sig Start Date End Date Taking? Authorizing Provider  ALPRAZolam (XANAX) 0.5 MG tablet Take 1 tablet 30 min. before MRI. Take the 2nd tablet with you and take it if needed.  You must not drive or operate dangerous equipment after taking this medication 11/12/16   Sater, Nanine Means, MD  chlorpheniramine-HYDROcodone (TUSSIONEX) 10-8 MG/5ML SUER Take 5 mLs by mouth every 12 (twelve) hours as needed for cough. 10/19/16   [provider]  cyclobenzaprine (FLEXERIL) 5 MG tablet Take 1 tablet (5 mg total) by mouth every 8 (eight) hours as needed for muscle spasms. 04/22/16   Sater, Nanine Means, MD  fludrocortisone (FLORINEF) 0.1 MG tablet Take 1 tablet (0.1 mg total) by mouth daily. 11/12/16    Sater, Nanine Means, MD  ondansetron (ZOFRAN ODT) 4 MG disintegrating tablet Take 1 tablet (4 mg total) by mouth every 8 (eight) hours as needed for nausea or vomiting. 10/19/16   Mina Marble D, NP    Physical Exam: Vitals:   11/17/16 1726 11/17/16 1745 11/17/16 1815 11/17/16 1830  BP:  (!) 122/91 108/69 116/89  Pulse: 79 70 71 84  Resp: 18 14 20 12   Temp:      TempSrc:      SpO2: 100% 100% 100% 100%    Constitutional: NAD, calm, comfortable Vitals:   11/17/16 1726 11/17/16 1745 11/17/16 1815 11/17/16 1830  BP:  (!) 122/91 108/69 116/89  Pulse: 79 70 71 84  Resp: 18 14 20 12   Temp:      TempSrc:      SpO2: 100% 100% 100% 100%   Eyes: PERRL, lids and conjunctivae normal ENMT: Mucous membranes are moist. Posterior pharynx clear of any exudate or lesions.Normal dentition.  Neck: normal, supple, no masses, no thyromegaly Respiratory: clear to auscultation bilaterally, no wheezing, no crackles. Normal respiratory effort. No accessory muscle use.  Cardiovascular: Regular rate and rhythm, no murmurs / rubs / gallops. No extremity edema. 2+ pedal pulses. No carotid bruits.  Abdomen: no tenderness, no masses palpated. No hepatosplenomegaly. Bowel sounds positive.  Musculoskeletal: no clubbing / cyanosis. No joint deformity upper and lower extremities. Good ROM, no contractures. Normal muscle tone.  Skin: no rashes, lesions, ulcers. No induration Neurologic: CN 2-12 grossly intact. Sensation intact, DTR normal. Strength 5/5 in all 4.  Psychiatric: Normal judgment and insight. Alert and oriented x 3. Normal mood.    Labs on Admission: I have personally reviewed following labs and imaging studies  CBC:  Recent Labs Lab 11/17/16 1727  WBC 6.0  NEUTROABS 3.8  HGB 12.5  HCT 37.7  MCV 75.6*  PLT 409   Basic Metabolic Panel:  Recent Labs Lab 11/17/16 1532  NA 137  K 3.6  CL 107  CO2 21*  GLUCOSE 73  BUN 10  CREATININE 0.59  CALCIUM 8.0*   GFR: Estimated Creatinine  Clearance: 98.2 mL/min (by C-G formula based on SCr of 0.59 mg/dL). Liver Function Tests:  Recent Labs Lab 11/17/16 1532  AST 20  ALT 10*  ALKPHOS 42  BILITOT 0.8  PROT 5.6*  ALBUMIN 3.4*   CBG:  Recent Labs Lab 11/17/16 1444  GLUCAP 85   Radiological Exams on Admission: Mr Jeri Cos Wo Contrast  Result Date: 11/17/2016  [facility] [address], [place], [place]  [address] [phone] NEUROIMAGING REPORT STUDY DATE: 11/17/2016 PATIENT NAME: SHERRONDA SWEIGERT DOB: [birth year] MRN: [id number] EXAM: MRI Brain with and without contrast ORDERING CLINICIAN: Richard A. Sater, MD. PhD CLINICAL HISTORY: 31 year old woman with headaches and history of head trauma COMPARISON FILMS: None TECHNIQUE: MRI of the brain with and without contrast was obtained utilizing 5 mm axial slices with T1, T2, T2 flair, T2 star gradient echo and diffusion weighted views.  T1 sagittal, T2 coronal and postcontrast views in the axial and coronal plane were obtained. CONTRAST: 12 ml Magnevist IMAGING SITE: [facility], [address] FINDINGS: On sagittal images, the spinal cord is imaged caudally to C4 and is normal in caliber.   The contents of the posterior fossa are of normal size and position.   The pituitary gland and optic chiasm appear normal.    Brain volume appears normal.   The ventricles are normal in size and without distortion.  There are no abnormal extra-axial collections of fluid.  The cerebellum and brainstem appears normal.   The deep gray matter appears normal. There is an expanded Virchow-Robin space in the lentiform nucleus on the left, a normal finding.  The cerebral hemispheres appear normal.  Diffusion weighted images are normal.  Gradient echo heme weighted images are normal.  The orbits appear normal.   The VIIth/VIIIth nerve complex appears normal.  The mastoid air cells appear normal.  The paranasal sinuses appear normal.  Flow voids are identified within  the major intracerebral arteries.   After the infusion of contrast material, a normal enhancement pattern is noted.    This is a normal MRI of the brain with and without contrast. INTERPRETING PHYSICIAN: Richard A. Felecia Shelling, MD, PhD, FAAN Certified in  Neuroimaging by  [employer]    EKG: Independently reviewed. 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  Have discussed with dr Rory Percy and dr Oval Linsey who also this is pseudoseizures.  Plan to obtain EEG in am.  She has a holter monitor arranged with her outpt neurologist on oct 17.  She has already had a 48 hour monitor which did not reveal any significant arrythmia.  These episodes are associated with her migraines which she is getting monthly occipital shots for which do not seem to be helping her.  That can be managed further by her outpt neurologist.  She has never had an EEG.  During this episode witnessed by myself, the neurologist and the cardiologist her vitals did not change, she remained in NSR with a normal rate and no arrythmia detected on monitor during episode.  Cont cardiac monitoring while here.  obs on tele.  Active Problems:   Occipital neuralgia of right side- noted   POTS (postural orthostatic tachycardia syndrome)- she is not orthostatic here.  She is on florinef.  Ck orthostatics q shift   OCD (obsessive compulsive disorder)- noted   Adjustment disorder with mixed anxiety and depressed mood- noted   Chronic migraine- noted   She may benefit from psychological counseling as an outpatient.   DVT prophylaxis:  scds Code Status:  full Family Communication:  husband Disposition Plan:  Per day team Consults called:  Neurologist, cardiologist Admission status:   observation   Matther Labell A MD [facility]  If  7PM-7AM, please contact night-coverage [email or web address] Password [facility]  11/17/2016, 6:53 PM

## 2016-11-18 ENCOUNTER — Encounter: Payer: Self-pay | Admitting: *Deleted

## 2016-11-18 ENCOUNTER — Telehealth: Payer: Self-pay | Admitting: *Deleted

## 2016-11-18 ENCOUNTER — Observation Stay (HOSPITAL_COMMUNITY): Payer: 59

## 2016-11-18 DIAGNOSIS — R000 Tachycardia, unspecified: Secondary | ICD-10-CM | POA: Diagnosis not present

## 2016-11-18 DIAGNOSIS — I951 Orthostatic hypotension: Secondary | ICD-10-CM | POA: Diagnosis not present

## 2016-11-18 DIAGNOSIS — M5481 Occipital neuralgia: Secondary | ICD-10-CM

## 2016-11-18 DIAGNOSIS — F4323 Adjustment disorder with mixed anxiety and depressed mood: Secondary | ICD-10-CM

## 2016-11-18 DIAGNOSIS — R55 Syncope and collapse: Principal | ICD-10-CM

## 2016-11-18 DIAGNOSIS — F429 Obsessive-compulsive disorder, unspecified: Secondary | ICD-10-CM

## 2016-11-18 DIAGNOSIS — G43709 Chronic migraine without aura, not intractable, without status migrainosus: Secondary | ICD-10-CM | POA: Diagnosis not present

## 2016-11-18 LAB — BASIC METABOLIC PANEL
ANION GAP: 7 (ref 5–15)
BUN: 10 mg/dL (ref 6–20)
CO2: 24 mmol/L (ref 22–32)
Calcium: 8.6 mg/dL — ABNORMAL LOW (ref 8.9–10.3)
Chloride: 107 mmol/L (ref 101–111)
Creatinine, Ser: 0.59 mg/dL (ref 0.44–1.00)
GFR calc Af Amer: >60 mL/min (ref >60)
Glucose, Bld: 87 mg/dL (ref 65–99)
POTASSIUM: 3.7 mmol/L (ref 3.5–5.1)
SODIUM: 138 mmol/L (ref 135–145)

## 2016-11-18 LAB — CBC
HEMATOCRIT: 36.6 % (ref 36.0–46.0)
Hemoglobin: 11.7 g/dL — ABNORMAL LOW (ref 12.0–15.0)
MCH: 24.4 pg — ABNORMAL LOW (ref 26.0–34.0)
MCHC: 32 g/dL (ref 30.0–36.0)
MCV: 76.3 fL — ABNORMAL LOW (ref 78.0–100.0)
Platelets: 210 10*3/uL (ref 150–400)
RBC: 4.8 MIL/uL (ref 3.87–5.11)
RDW: 14 % (ref 11.5–15.5)
WBC: 5.4 10*3/uL (ref 4.0–10.5)

## 2016-11-18 NOTE — Telephone Encounter (Signed)
MRI results sent via My Chart message/fim

## 2016-11-18 NOTE — Procedures (Signed)
ELECTROENCEPHALOGRAM REPORT  Date of Study: 11/18/2016  Patient's Name: Cindy Turner MRN: [id number] Date of Birth: [birth year]  Referring Provider: Dr. Derrill Kay  Clinical History: This is a 30 year old woman with frequent episodes of passing out.   Medications: cyclobenzaprine (FLEXERIL) tablet 5 mg  fludrocortisone (FLORINEF) tablet 0.1 mg  ondansetron (ZOFRAN-ODT) disintegrating tablet 4 mg   Technical Summary: A multichannel digital EEG recording measured by the international 10-20 system with electrodes applied with paste and impedances below 5000 ohms performed in our laboratory with EKG monitoring in an awake patient.  Hyperventilation and photic stimulation were performed.  The digital EEG was referentially recorded, reformatted, and digitally filtered in a variety of bipolar and referential montages for optimal display.    Description: The patient is awake during the recording.  During maximal wakefulness, there is a symmetric, medium voltage 10 Hz posterior dominant rhythm that attenuates with eye opening.  The record is symmetric. Sleep was not captured. Hyperventilation and photic stimulation did not elicit any epileptiform abnormalities. During hyperventilation, she reported she was going to pass out, then started having intermittent irregular left arm shaking/tremulousness and side to side head shaking with eyes closed, lasting 30 seconds. When staff stimulated her, she startles awake and reports nausea. She then reports she was going out again and closes eyes. During these episodes, there were no EEG or EKG changes seen.  There were no epileptiform discharges or electrographic seizures seen.    EKG lead was unremarkable.  Impression and Clinical Correlation: This awake EEG is normal.  Patient had 2 episodes captured, one where she reported she was going to pass out followed by intermittent left arm tremulousness and side to side head shaking with eyes closed, and  another where she reported going out again with no motor activity seen, both with no EEG changes, consistent with psychogenic non-epileptic events.   Ellouise Newer, M.D.

## 2016-11-18 NOTE — Telephone Encounter (Signed)
-----   Message from Britt Bottom, MD sent at 11/17/2016  6:21 PM EDT ----- Please let her know that the MRI was normal.

## 2016-11-18 NOTE — Progress Notes (Signed)
EEG Completed; Results Pending  

## 2016-11-18 NOTE — Progress Notes (Signed)
CSW received consult for patient to have Psych/counseling resources. CSW placed follow up information for [place] in patient's discharge paperwork.  CSW signing off.  Percell Locus Adeana Grilliot LCSWA [phone]

## 2016-11-19 NOTE — Discharge Summary (Signed)
Physician Discharge Summary  Cindy Turner:[id number] DOB: August 01, 1985 DOA: 11/17/2016  PCP: Mellody Dance, DO  Admit date: 11/17/2016 Discharge date: 11/18/2016  Admitted From: Home Disposition:  Home  Recommendations for Outpatient Follow-up:  1. Follow up with PCP in 1-2 weeks  Home Health: N Equipment/Devices: None   Discharge Condition: Good  CODE STATUS: FULL Diet recommendation: Regular  Brief/Interim Summary: 31 y.o. female with medical history significant of fibromyalgia, migraines, Potts, raynauds comes in with more frequent episodes of "passing out".  Pt reports this has been happening for ten years but has been much more frequent the last several months.  It is now occurring many times a day.  An episode actually occurred while me and the cardiology APP were in the room.  She was telling us what occurs with her husband in the room.  Before the episode occurs she gets this right sided face numbness and her arms start to tingle and she know she is about "to go down".  She lies down , passes out for several minutes than comes too.  Her husband has witnessed these and reports her eyes will start to get strange rolling back and then she starts to twitch all over.  Then she suddenly wakes up.  He reports she is not confused after these spells, she quickly comes too.  She has no urinary or bowel incontinence during these episodes.  She reports she has a severe migraine after they occur.  No fevers.  No n/v/d.   She does not hurt herself during these episodes and there is no tongue injury during any of these episodes.  She usually feels palpitations during it.  During our interview she had one of these where she said "its happening"  Her eyes rolled over and she started twitching her hand, her vitals all did not change during the episode, it lasted less than 2 minutes than she suddenly woke up in bed alert.  Pt is being referred for admission for possible seizures.  During the  episode she did not respond to painful stimuli.  The patient was evaluated by Neurology who felt clinically that her observed episodes were not seizure-like, but were psychogenic.  EEG was obtained during which two episodes occurred and showed no seizure-like discharges.  MRI brain was normal.  Cardiology were consulted who recommended to avoid cardiac workup, given her syncope episodes were primarily characterized by tremoring, which is extremely atypical for cardiogenic syncope.  Discharge Diagnoses:  Principal Problem:   Syncope and collapse Active Problems:   Occipital neuralgia of right side   POTS (postural orthostatic tachycardia syndrome)   OCD (obsessive compulsive disorder)   Adjustment disorder with mixed anxiety and depressed mood   Chronic migraine    Discharge Instructions  Discharge Instructions    Diet general    Complete by:  As directed    Discharge instructions    Complete by:  As directed    See your PCP within the next two weeks.   Ask them about referrals for a sleep study. Also ask about referrals for counseling (if you are still trying to find one). An alternative would be to discuss a sleep study with your Neurologist.   Increase activity slowly    Complete by:  As directed      Allergies as of 11/18/2016      Reactions   Pineapple Anaphylaxis, Itching, Rash   Terbutaline Sulfate Other (See Comments)   Arrhythmia   Vitamin A Other (See Comments)  Headaches    Latex Rash      Medication List    TAKE these medications   ALPRAZolam 0.5 MG tablet Commonly known as:  XANAX Take 1 tablet 30 min. before MRI. Take the 2nd tablet with you and take it if needed.  You must not drive or operate dangerous equipment after taking this medication   cyclobenzaprine 5 MG tablet Commonly known as:  FLEXERIL Take 1 tablet (5 mg total) by mouth every 8 (eight) hours as needed for muscle spasms.   ondansetron 4 MG disintegrating tablet Commonly known as:   ZOFRAN ODT Take 1 tablet (4 mg total) by mouth every 8 (eight) hours as needed for nausea or vomiting.      Follow-up [place]. Call.   Specialty:  Professional Counselor Why:  Walk in or call for an appointment.  Contact information: [facility] [address] [place] [address] [phone]          Allergies  Allergen Reactions  . Pineapple Anaphylaxis, Itching and Rash  . Terbutaline Sulfate Other (See Comments)    Arrhythmia  . Vitamin A Other (See Comments)    Headaches   . Latex Rash    Consultations:  Cardiology, Neurology   Procedures/Studies: Mr Kizzie Fantasia Contrast  Result Date: 11/17/2016  [facility] [address], [place], [place] [address] [phone] NEUROIMAGING REPORT STUDY DATE: 11/17/2016 PATIENT NAME: Cindy Turner DOB: [birth year] MRN: [id number] EXAM: MRI Brain with and without contrast ORDERING CLINICIAN: Richard A. Sater, MD. PhD CLINICAL HISTORY: 31 year old woman with headaches and history of head trauma COMPARISON FILMS: None TECHNIQUE: MRI of the brain with and without contrast was obtained utilizing 5 mm axial slices with T1, T2, T2 flair, T2 star gradient echo and diffusion weighted views.  T1 sagittal, T2 coronal and postcontrast views in the axial and coronal plane were obtained. CONTRAST: 12 ml Magnevist IMAGING SITE: [facility], [address] FINDINGS: On sagittal images, the spinal cord is imaged caudally to C4 and is normal in caliber.   The contents of the posterior fossa are of normal size and position.   The pituitary gland and optic chiasm appear normal.    Brain volume appears normal.   The ventricles are normal in size and without distortion.  There are no abnormal extra-axial collections of fluid.  The cerebellum and brainstem appears normal.   The deep gray matter appears normal. There is an expanded Virchow-Robin  space in the lentiform nucleus on the left, a normal finding.  The cerebral hemispheres appear normal.  Diffusion weighted images are normal.  Gradient echo heme weighted images are normal.  The orbits appear normal.   The VIIth/VIIIth nerve complex appears normal.  The mastoid air cells appear normal.  The paranasal sinuses appear normal.  Flow voids are identified within the major intracerebral arteries.   After the infusion of contrast material, a normal enhancement pattern is noted.    This is a normal MRI of the brain with and without contrast. INTERPRETING PHYSICIAN: Richard A. Felecia Shelling, MD, PhD, FAAN Certified in  Neuroimaging by [employer]       Subjective: She feels better.  She has had some episodes today, but no chest pain, dyspnea, palpitations, focal weakness, facial droop, slurred speech.  Discharge Exam: Vitals:   11/18/16 1300 11/18/16 1400  BP: 119/76   Pulse: 74 75  Resp: 10  17  Temp: 97.9 F (36.6 C)   SpO2:     Vitals:   11/18/16 1000 11/18/16 1100 11/18/16 1300 11/18/16 1400  BP:   119/76   Pulse: 73 67 74 75  Resp: 16 15 10 17   Temp:   97.9 F (36.6 C)   TempSrc:   Oral   SpO2:      Weight:      Height:        General: Pt is alert, awake, not in acute distress Cardiovascular: RRR, S1/S2 +, no rubs, no gallops Respiratory: CTA bilaterally, no wheezing, no rhonchi Abdominal: Soft, NT, ND, bowel sounds + Extremities: no edema, no cyanosis    The results of significant diagnostics from this hospitalization (including imaging, microbiology, ancillary and laboratory) are listed below for reference.     Microbiology: No results found for this or any previous visit (from the past 240 hour(s)).   Labs: BNP (last 3 results) No results for input(s): BNP in the last 8760 hours. Basic Metabolic Panel:  Recent Labs Lab 11/17/16 1532 11/18/16 0424  NA 137 138  K 3.6 3.7  CL 107 107  CO2 21* 24  GLUCOSE 73 87  BUN 10 10   CREATININE 0.59 0.59  CALCIUM 8.0* 8.6*   Liver Function Tests:  Recent Labs Lab 11/17/16 1532  AST 20  ALT 10*  ALKPHOS 42  BILITOT 0.8  PROT 5.6*  ALBUMIN 3.4*   No results for input(s): LIPASE, AMYLASE in the last 168 hours. No results for input(s): AMMONIA in the last 168 hours. CBC:  Recent Labs Lab 11/17/16 1727 11/18/16 0424  WBC 6.0 5.4  NEUTROABS 3.8  --   HGB 12.5 11.7*  HCT 37.7 36.6  MCV 75.6* 76.3*  PLT 193 210   Cardiac Enzymes: No results for input(s): CKTOTAL, CKMB, CKMBINDEX, TROPONINI in the last 168 hours. BNP: Invalid input(s): POCBNP CBG:  Recent Labs Lab 11/17/16 1444  GLUCAP 85   D-Dimer No results for input(s): DDIMER in the last 72 hours. Hgb A1c No results for input(s): HGBA1C in the last 72 hours. Lipid Profile No results for input(s): CHOL, HDL, LDLCALC, TRIG, CHOLHDL, LDLDIRECT in the last 72 hours. Thyroid function studies No results for input(s): TSH, T4TOTAL, T3FREE, THYROIDAB in the last 72 hours.  Invalid input(s): FREET3 Anemia work up No results for input(s): VITAMINB12, FOLATE, FERRITIN, TIBC, IRON, RETICCTPCT in the last 72 hours. Urinalysis    Component Value Date/Time   COLORURINE YELLOW 10/23/2014 1957   APPEARANCEUR CLEAR 10/23/2014 1957   LABSPEC 1.011 10/23/2014 1957   PHURINE 8.0 10/23/2014 1957   GLUCOSEU NEGATIVE 10/23/2014 1957   HGBUR NEGATIVE 10/23/2014 1957   BILIRUBINUR negative 08/19/2016 0951   KETONESUR 15 (A) 10/23/2014 1957   PROTEINUR 30 08/19/2016 0951   PROTEINUR NEGATIVE 10/23/2014 1957   UROBILINOGEN 0.2 08/19/2016 0951   UROBILINOGEN 1.0 10/23/2014 1957   NITRITE negative 08/19/2016 0951   NITRITE NEGATIVE 10/23/2014 1957   LEUKOCYTESUR Moderate (2+) (A) 08/19/2016 0951   Sepsis Labs Invalid input(s): PROCALCITONIN,  WBC,  LACTICIDVEN Microbiology No results found for this or any previous visit (from the past 240 hour(s)).   Time coordinating discharge: Over 30  minutes  SIGNED:   Edwin Dada, MD  [facility] 11/18/2016, 3:30 PM   If 7PM-7AM, please contact night-coverage [email or web address] Password [email or web address]

## 2016-11-30 ENCOUNTER — Telehealth: Payer: Self-pay | Admitting: Neurology

## 2016-11-30 ENCOUNTER — Ambulatory Visit: Payer: 59

## 2016-11-30 ENCOUNTER — Ambulatory Visit (INDEPENDENT_AMBULATORY_CARE_PROVIDER_SITE_OTHER): Payer: 59 | Admitting: Family Medicine

## 2016-11-30 ENCOUNTER — Encounter: Payer: Self-pay | Admitting: Family Medicine

## 2016-11-30 VITALS — BP 126/80 | HR 66 | Ht 67.0 in | Wt 137.1 lb

## 2016-11-30 DIAGNOSIS — R5382 Chronic fatigue, unspecified: Secondary | ICD-10-CM

## 2016-11-30 DIAGNOSIS — G479 Sleep disorder, unspecified: Secondary | ICD-10-CM

## 2016-11-30 DIAGNOSIS — F4329 Adjustment disorder with other symptoms: Secondary | ICD-10-CM

## 2016-11-30 DIAGNOSIS — R0681 Apnea, not elsewhere classified: Secondary | ICD-10-CM

## 2016-11-30 DIAGNOSIS — Z72820 Sleep deprivation: Secondary | ICD-10-CM

## 2016-11-30 NOTE — Progress Notes (Signed)
Impression and Recommendations:    No diagnosis found.  There are no diagnoses linked to this encounter.  No problem-specific Assessment & Plan notes found for this encounter.   The patient was counseled, risk factors were discussed, anticipatory guidance given.   New Prescriptions   No medications on file    Discontinued Medications   ONDANSETRON (ZOFRAN ODT) 4 MG DISINTEGRATING TABLET    Take 1 tablet (4 mg total) by mouth every 8 (eight) hours as needed for nausea or vomiting.    Modified Medications   No medications on file     No orders of the defined types were placed in this encounter.    No orders of the defined types were placed in this encounter.    Gross side effects, risk and benefits, and alternatives of medications and treatment plan in general discussed with patient.  Patient is aware that all medications have potential side effects and we are unable to predict every side effect or drug-drug interaction that may occur.   Patient will call with any questions prior to using medication if they have concerns.  Expresses verbal understanding and consents to current therapy and treatment regimen.  No barriers to understanding were identified.  Red flag symptoms and signs discussed in detail.  Patient expressed understanding regarding what to do in case of emergency\urgent symptoms  Please see AVS handed out to patient at the end of our visit for further patient instructions/ counseling done pertaining to today's office visit.   No Follow-up on file.     Note: This document was prepared using Dragon voice recognition software and may include unintentional dictation errors.  Mellody Dance 1:59 PM --------------------------------------------------------------------------------------------------------------------------------------------------------------------------------------------------------------------------------------------    Subjective:     CC:  Chief Complaint  Patient presents with  . Hospitalization Follow-up    Patient went to ED on 11/17/2016 after passing out at home    HPI: Cindy Turner is a 31 y.o. female who presents to [place] at [facility] today for issues as discussed below.  In hospital- told her she had significant sx of OSA.   They would have to "jump on her sternum and do sternal rub" to get her to breath again.  She was observed by the RN staff in the hospital she was holding her breath for 25-30 seconds at a time and then would gasp for air.  Patient reports lates that she has never been a good sleeper ever since she's been a teenager.  Sleeps on average 3 hours per night within the past year or so.  She is having difficulty sleeping- Only slept for 2 hours last 2 days.  She paces the house quite often in the middle the night.  She goes to the recliner to the bed and back again as she is trying to make yourself sleepy.  Patient has only used Flexeril in the recent past over the past 12 months for sleeping.  Which didn't really did not help her sleep anyhow.    No problems updated.   Wt Readings from Last 3 Encounters:  11/30/16 137 lb 1.6 oz (62.2 kg)  11/17/16 130 lb 3.2 oz (59.1 kg)  11/12/16 137 lb (62.1 kg)   BP Readings from Last 3 Encounters:  11/30/16 126/80  11/18/16 119/76  11/12/16 115/77   Pulse Readings from Last 3 Encounters:  11/30/16 66  11/18/16 75  11/12/16 72   BMI Readings from Last 3 Encounters:  11/30/16 21.47 kg/m  11/17/16 20.39 kg/m  11/12/16 21.46 kg/m     Patient Care Team    Relationship Specialty Notifications Start End  Mellody Dance, DO PCP - General Family Medicine  12/25/15   Reche Dixon, PA-C Consulting Physician Orthopedic Surgery  12/25/15   Adrian Prows, MD Consulting Physician Cardiology  12/25/15   Britt Bottom, MD  Neurology  12/25/15      Patient Active Problem List   Diagnosis Date Noted  . Family history of  diabetes mellitus 12/25/2015    Priority: High  . Adjustment disorder with mixed anxiety and depressed mood 12/25/2015    Priority: High  . Genetic testing 10/26/2016  . Nausea and vomiting 10/19/2016  . URI, acute 10/19/2016  . Right otitis media 10/19/2016  . Tremor 07/16/2016  . Syncope and collapse 07/16/2016  . Head injury 07/16/2016  . Rib contusion, right, sequela 07/09/2016  . Chronic migraine 04/22/2016  . Chronic pelvic pain in female 01/17/2016  . Endometriosis of pelvic peritoneum 01/17/2016  . Hx of tubal ligation 01/17/2016  . Vaginitis 01/17/2016  . Dysuria 01/16/2016  . Acute bilateral low back pain 01/16/2016  . Painless hematuria 01/16/2016  . Irregular periods 01/16/2016  . Right shoulder injury- ortho 12/25/2015  . OCD (obsessive compulsive disorder) 12/25/2015  . Family history of breast cancer in first degree relative- mother age 19 12/25/2015  . Family history of uterine cancer- Mom age 59 12/25/2015  . Family history of malignant melanoma of skin- in 30's 12/25/2015  . Common migraine without intractability 11/25/2015  . Occipital neuralgia of right side 11/25/2015  . Neck pain 11/25/2015  . POTS (postural orthostatic tachycardia syndrome) 11/25/2015  . Lightheadedness 11/25/2015  . Insomnia 11/25/2015  . Periodic limb movement disorder (PLMD) 11/25/2015  . Right wrist injury and R shoulder- injured at work- ortho 09/11/2015  . 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 Fibromyalgia 05/16/2011  . Abdominal pain, acute, right upper quadrant 09/01/2010    Past Medical history, Surgical history, Family history, Social history, Allergies and Medications have been entered into the medical record, reviewed and changed as needed.    Current Meds  Medication Sig  . cyclobenzaprine (FLEXERIL) 5 MG tablet Take 1 tablet (5 mg total) by mouth every 8 (eight) hours as needed for muscle spasms.    Allergies:  Allergies  Allergen Reactions  . Pineapple  Anaphylaxis, Itching and Rash  . Terbutaline Sulfate Other (See Comments)    Arrhythmia  . Vitamin A Other (See Comments)    Headaches   . Latex Rash     Review of Systems: General:   Denies fever, chills, unexplained weight loss.  Optho/Auditory:   Denies visual changes, blurred vision/LOV Respiratory:   Denies wheeze, DOE more than baseline levels.  Cardiovascular:   Denies chest pain, palpitations, new onset peripheral edema  Gastrointestinal:   Denies nausea, vomiting, diarrhea, abd pain.  Genitourinary: Denies dysuria, freq/ urgency, flank pain or discharge from genitals.  Endocrine:     Denies hot or cold intolerance, polyuria, polydipsia. Musculoskeletal:   Denies unexplained myalgias, joint swelling, unexplained arthralgias, gait problems.  Skin:  Denies new onset rash, suspicious lesions Neurological:     Denies dizziness, unexplained weakness, numbness  Psychiatric/Behavioral:   Denies mood changes, suicidal or homicidal ideations, hallucinations    Objective:   Blood pressure 126/80, pulse 66, height 5\' 7"  (1.702 m), weight 137 lb 1.6 oz (62.2 kg), last menstrual period 11/10/2016. Body mass index is 21.47 kg/m. General:  Well Developed, well nourished,  appropriate for stated age.  Neuro:  Alert and oriented,  extra-ocular muscles intact  HEENT:  Normocephalic, atraumatic, neck supple, no carotid bruits appreciated  Skin:  no gross rash, warm, pink. Cardiac:  RRR, S1 S2 Respiratory:  ECTA B/L and A/P, Not using accessory muscles, speaking in full sentences- unlabored. Vascular:  Ext warm, no cyanosis apprec.; cap RF less 2 sec. Psych:  No HI/SI, judgement and insight good, Euthymic mood. Full Affect.

## 2016-11-30 NOTE — Patient Instructions (Addendum)
Please talk to your neurologist and see if he thinks a neuropsychiatrist would be beneficial for you to see and be evaluated by.   - Please call your medical insurance company and see if they have preferred provider in the area.  Here are some recommendations below.   Please I want you to find a counselor and start seeing them about your stress levels etc.   I want you to speed walk for 20 minutes every day.  This is part of your treatment plan.  Eat 2-3 times daily.      Use lose it app to track your intake daily.    Behavioral Health/Psychiatry Referrals    Dr. Tomi Bamberger, PHD Dr. Tomi Bamberger, PHD is a counselor in [place], [place].  [address] [place], [place] [address] [place] [phone]   Kristie Cowman, [place]  31 [phone] [email or web address] ( she does [place] and faith-based coaching and counseling)   [email or web address] [employer]- gen counseling;  PHD   Wilber Oliphant, MSW [address] [place]   [facility] Apolonio Schneiders, PhD [address] [place] [phone]   [facility]- children [address] [place], [place] [phone]   Lanelle Bal Professional Counselor Counseling and [employer] [place] abuse [facility] Manager [address] [place] [phone] [employer] [address] [place] Delmer Islam, PhD Oneida [place], PhD Leitha Bleak, LCSW Jillene Bucks, PhD-child, adolescent and adults   [facility] [address], [place] [phone] Merilyn Baba, MS-child, adolescent and adults Lennart Pall, PhD-adolescent and  adults   [facility]-grief, terminal illness [place], [place] [address], [facility] [address], [place], [place]   [facility] [address] [place] [address], [place], [place] [phone]   [facility] [address], [place] [phone]   [facility] [address], [place]   [facility] [address], [place] [address], [place]

## 2016-11-30 NOTE — Telephone Encounter (Signed)
Pt. had one of her typical episodes during EEG done at the hospital, and no EEG changes, sz. activity seen.  EEG sched. in our office cancelled/fim

## 2016-11-30 NOTE — Telephone Encounter (Signed)
Pt called to c/a EEG today. Pt said she had 2 EEG's while in the hospital 11/17/16 and thought she had c/a this. I spoke with Faith at lunch about taking her off the schedule but she said the pt might need to keep the appt. I am sending a reminder to check on this appt. Thank you.

## 2017-01-19 ENCOUNTER — Ambulatory Visit: Payer: Commercial Managed Care - HMO | Admitting: Neurology

## 2017-01-25 ENCOUNTER — Encounter: Payer: Self-pay | Admitting: Family Medicine

## 2017-01-25 ENCOUNTER — Ambulatory Visit (INDEPENDENT_AMBULATORY_CARE_PROVIDER_SITE_OTHER): Payer: 59 | Admitting: Family Medicine

## 2017-01-25 VITALS — BP 110/73 | HR 62 | Temp 98.5°F | Ht 67.0 in | Wt 140.2 lb

## 2017-01-25 DIAGNOSIS — J029 Acute pharyngitis, unspecified: Secondary | ICD-10-CM | POA: Diagnosis not present

## 2017-01-25 DIAGNOSIS — H60392 Other infective otitis externa, left ear: Secondary | ICD-10-CM | POA: Diagnosis not present

## 2017-01-25 DIAGNOSIS — J019 Acute sinusitis, unspecified: Secondary | ICD-10-CM | POA: Diagnosis not present

## 2017-01-25 DIAGNOSIS — H6502 Acute serous otitis media, left ear: Secondary | ICD-10-CM

## 2017-01-25 DIAGNOSIS — R509 Fever, unspecified: Secondary | ICD-10-CM

## 2017-01-25 DIAGNOSIS — R42 Dizziness and giddiness: Secondary | ICD-10-CM

## 2017-01-25 DIAGNOSIS — R52 Pain, unspecified: Secondary | ICD-10-CM | POA: Diagnosis not present

## 2017-01-25 DIAGNOSIS — H6983 Other specified disorders of Eustachian tube, bilateral: Secondary | ICD-10-CM | POA: Diagnosis not present

## 2017-01-25 LAB — POCT RAPID STREP A (OFFICE): RAPID STREP A SCREEN: NEGATIVE

## 2017-01-25 LAB — POC INFLUENZA A&B (BINAX/QUICKVUE)
Influenza A, POC: NEGATIVE
Influenza B, POC: NEGATIVE

## 2017-01-25 MED ORDER — CIPROFLOXACIN-DEXAMETHASONE 0.3-0.1 % OT SUSP: 4.0000 [drp] | Freq: Two times a day (BID) | OTIC | 0 refills | Status: AC

## 2017-01-25 MED ORDER — AMOXICILLIN-POT CLAVULANATE 875-125 MG PO TABS: 1.0000 | ORAL_TABLET | Freq: Two times a day (BID) | ORAL | 0 refills | Status: AC

## 2017-01-25 MED ORDER — PREDNISONE 20 MG PO TABS: ORAL_TABLET | ORAL | 0 refills | Status: DC

## 2017-01-25 NOTE — Progress Notes (Signed)
Acute Care Office visit  Assessment and plan:  1. Sore throat   2. Fever, unspecified fever cause   3. Generalized body aches   4. Acute serous otitis media of left ear, recurrence not specified   5. Other infective acute otitis externa of left ear   6. ETD (Eustachian tube dysfunction), bilateral   7. Vertigo   8. Acute sinusitis, recurrence not specified, unspecified location     Pt prescribed Augmentin and prednisone  -Ciprodex ear drops for left ear  - Pt advised exercises for vertigo. - Viral vs Allergic vs Bacterial causes for pt's symptoms reveiwed.    - Supportive care and various OTC medications discussed in addition to any prescribed. - Call or RTC if new symptoms, or if no improvement or worse over next couple days.   Pharyngitis:  Pt with sore throat x4 days POCT rapid strep ran today and results negative. Pt advised to drink many fluids and get a lot of rest.    Education and routine counseling performed. Handouts provided.  Orders Placed This Encounter  Procedures  . POCT rapid strep A  . POC Influenza A&B (Binax test)    Anticipatory guidance and routine counseling done re: condition, txmnt options and need for follow up. All questions of patient's were answered.   Gross side effects, risk and benefits, and alternatives of medications discussed with patient.  Patient is aware that all medications have potential side effects and we are unable to predict every sideeffect or drug-drug interaction that may occur.  Expresses verbal understanding and consents to current therapy plan and treatment regiment.   Return if symptoms worsen or fail to improve.  Please see AVS handed out to patient at the end of our visit for additional patient instructions/ counseling done pertaining to today's office visit.  Note: This document was prepared using Dragon voice recognition software and may include unintentional dictation errors.   This document serves as a record  of services personally performed by Mellody Dance, MD. It was created on her behalf by Mayer Masker, a trained medical scribe. The creation of this record is based on the scribe's personal observations and the provider's statements to them.   I have reviewed the above documentation for accuracy and completeness, and I agree with the above.   Mellody Dance 01/25/17 7:19 PM   Subjective:    Chief Complaint  Patient presents with  . Sore Throat    sore throat, fevers, cough    HPI:    Pt presents with Sx for 4 days.   C/o: Pt states her symptoms initiated with a migraine which worsened into nausea, vomiting, right-sided back pain and abdominal pain, generalized body aches, sore throat, HA, facial swelling, nasal swelling, 100+ fever, cough, pain with breathing, and "shooting pain up her spine". She has taken nyquil with mild relief Denies: SOB    For symptoms patient has tried:  Nyquil with mild relief.  Overall getting worse.    Past medical history, Surgical history, Family history reviewed and noted below, Social history, Allergies, and Medications have been entered into the medical record, reviewed and changed as needed.   Allergies  Allergen Reactions  . Pineapple Anaphylaxis, Itching and Rash  . Terbutaline Sulfate Other (See Comments)    Arrhythmia  . Vitamin A Other (See Comments)    Headaches   . Latex Rash    Review of Systems: General:    F (100+)/C, no wt loss Pulm:   No DIB,  pleuritic chest pain Card:  No CP, palpitations Abd:  pain Ext:  No inc edema from baseline   Objective:   Blood pressure 110/73, pulse 62, temperature 98.5 F (36.9 C), height 5\' 7"  (1.702 m), weight 140 lb 3.2 oz (63.6 kg), last menstrual period 01/04/2017. Body mass index is 21.96 kg/m. General: Well Developed, well nourished, appropriate for stated age.  Neuro: Alert and oriented x3, extra-ocular muscles intact, sensation grossly intact.  HEENT: Normocephalic, atraumatic,  pupils equal round reactive to light, neck supple, no masses, no painful lymphadenopathy, TM's intact B/L, no acute findings. Nares- patent, clear d/c, OP- clear, mild erythema, No TTP sinuses. Throat normal, no erythema. Right opacity to TM on the right Decreased light reflex  The left is 3/4 opaque with air- fluid levels and EAC swollen and erythematous.  no light reflex Skin: Warm and dry, no gross rash. Cardiac: RRR, S1 S2,  no murmurs rubs or gallops.  Respiratory: ECTA B/L and A/P, Not using accessory muscles, speaking in full sentences- unlabored. Breath sounds normal, no rales, rhonchi, wheezing. Vascular:  No gross lower ext edema, cap RF less 2 sec. Psych: No HI/SI, judgement and insight good, Euthymic mood. Full Affect.   Patient Care Team    Relationship Specialty Notifications Start End  Mellody Dance, DO PCP - General Family Medicine  12/25/15   Reche Dixon, PA-C Consulting Physician Orthopedic Surgery  12/25/15   Adrian Prows, MD Consulting Physician Cardiology  12/25/15   Britt Bottom, MD  Neurology  12/25/15

## 2017-01-25 NOTE — Patient Instructions (Signed)
How to Treat Vertigo at Home with Exercises  What is Vertigo?  Vertigo is a relatively common symptom most often associated with conditions such as sinusitis (inflammation of your sinuses due to viruses, allergies, or bacterial infections), or an inner ear infection or ear trauma.   It can be brought on by trauma (e.g. a blow to the head or whiplash) or more serious things like minor strokes.   Symptoms can also be brought on by normal degenerative changes to your inner ear that occur with aging.  The condition tends to be more commonly seen in the elderly but it can occur in all ages.    Patients most often complain of dizziness, as if the room is spinning around them.   Symptoms are provoked by quick head movements or changes in position like going from standing to lying in bed, or even turning over in bed.   It may present with nausea and/or vomiting, and can be very debilitating to some folks.    By far the most common cause, known as Benign Paroxysmal Positional Vertigo (BPPV), is categorized by a sudden onset of symptoms, that are intense but short-lived (60 seconds or less), which is triggered by a change in head position.   Symptoms usually dissipate if you stay in one position and do not move your head.   Within the inner ear are collections of calcium carbonate crystals referred to as "otoliths" which may become dislodged from their normal position and migrate into the semicircular canals of the inner ear, throwing off your body's ability to sense where you are in space.     Fig. 921 Anatomy of the Right Osseous Labyrinth. Antonieta Iba. Anatomy of the Human Body. 1918.            What Else Could Be Behind My Vertigo?  Some other causes of vertigo include:  Meniere's disease (disorder of inner ear with ringing in ears, feeling of fullness/pressure within ear, and fluctuating hearing loss) Tumours Neurological disorders e.g. Multiple Sclerosis Motion Sickness (lack of coordination  between visual stimuli, inner ear balance and positional sense) Migraine Labyrinthitis (inflammation of the fluid-filled tubes and sacs within the inner ear; may also be associated with changes in hearing) Vestibular neuritis (inflammation of the nerves associated with transmission of sensory info from the inner ear; usually of viral origins)  How it can be treated/cured? While certain medications have been prescribed for vertigo including Lorazepam your doing well 7 house the house going organizing and getting things ready for sale with the and Meclizine (for motion sickness), there exists no evidence to support a recommendation of any medication in the routine treatment of BPPV.  Clinical trials have demonstrated that repositioning techniques (listed below) are a superior option for management Otis Dials et al., 2008).    Figure above:  (A) Instructions for the modified Epley procedure (MEP) for left ear posterior canal benign paroxysmal positional vertigo (PC-BPPV). For right ear BPPV, the procedure has to be performed in the opposite direction, starting with the head turned to the right side.  1. Start by sitting on a bed with your head turned 45 to the left. Place a pillow behind you so that on lying back it will be under your shoulders.  2. Lie back quickly with shoulders on the pillow, neck extended, and head resting on the bed. In this position, the affected (left) ear is underneath. Wait for 30 secondS.  3. Turn your head 90 to the right (without raising it), and  wait again for 30 seconds.  ?4. Turn your body and head another 90? to the right, and wait for another 30 seconds.  ?5. Sit up on the right side. This maneuver should be performed three times a day. Repeat this daily until you are free from positional vertigo for 24 hours. ? ? (B) Instructions for the modified Semont maneuver (MSM) for left ear PC-BPPV. For right ear BPPV, the maneuver has to be performed in the opposite direction,  starting with the head turned toward the left ear.  ?1. Sit upright on a bed with your head turned 45? toward the right ear.  ?2. Drop quickly to the left side, so that your head touches the bed behind your left ear. Wait 30 seconds.  ?3. Move head and trunk in a swift movement toward the other side without stopping in the upright position, so that your head comes to rest on the right side of your forehead. Wait again for 30 seconds.  ?4. Sit up again.  ?This maneuver should be performed three times a day. Repeat this daily until you are free from positional vertigo symptoms for 24 hours.  ? ?(   See the video in the supplementary material on the NeurologyWeb site; go to http://www.neurology.org/content/63/1/150/F1.expansion.html   ) ? ? ? ? ?You can also try this motion at home as well- ?Self-Treatment of Benign Paroxysmal Positional Vertigo ?Benign Paroxysmal Positioning Vertigo is caused by loose inner ear crystals in the inner ear that migrate while sleeping to the back-bottom inner ear balance canal, the so-called ?posterior semi-circular canal.? The maneuver demonstrated below is the way to reposition the loose crystals so that the symptoms caused by the loose crystals go away. You may have a floating, swaying sense while walking or sitting for a few days after this procedure.  ? ? ? ? ? ? ? ? ? ? ?

## 2017-01-26 ENCOUNTER — Encounter: Payer: Self-pay | Admitting: Family Medicine

## 2017-02-10 ENCOUNTER — Encounter: Payer: Self-pay | Admitting: Internal Medicine

## 2017-02-11 ENCOUNTER — Telehealth: Payer: Self-pay | Admitting: Family Medicine

## 2017-02-11 NOTE — Telephone Encounter (Signed)
Called patient and notified her that per Dr. Hershal Coria instructions our recommends is for her to go to an Urgent Care due to [place] having no appointments open. MPulliam, CMA/RT(R)

## 2017-02-11 NOTE — Telephone Encounter (Signed)
Patient called in wanting to speak with someone clinical about not feeling any better. She has completed the course of meds but is still running a fever daily. She also complain of pain in her ears and right side of neck.

## 2017-02-13 ENCOUNTER — Other Ambulatory Visit: Payer: Self-pay

## 2017-02-13 ENCOUNTER — Encounter (HOSPITAL_BASED_OUTPATIENT_CLINIC_OR_DEPARTMENT_OTHER): Payer: Self-pay | Admitting: *Deleted

## 2017-02-13 ENCOUNTER — Emergency Department (HOSPITAL_BASED_OUTPATIENT_CLINIC_OR_DEPARTMENT_OTHER): Payer: 59

## 2017-02-13 DIAGNOSIS — Z91040 Latex allergy status: Secondary | ICD-10-CM | POA: Insufficient documentation

## 2017-02-13 DIAGNOSIS — Z79899 Other long term (current) drug therapy: Secondary | ICD-10-CM | POA: Insufficient documentation

## 2017-02-13 DIAGNOSIS — R05 Cough: Secondary | ICD-10-CM | POA: Diagnosis not present

## 2017-02-13 DIAGNOSIS — R509 Fever, unspecified: Secondary | ICD-10-CM | POA: Diagnosis not present

## 2017-02-13 DIAGNOSIS — R0981 Nasal congestion: Secondary | ICD-10-CM | POA: Diagnosis not present

## 2017-02-13 NOTE — ED Triage Notes (Signed)
Pt reports cough and fever x 4 weeks. Pt saw her PCP in December and was dx with double ear infections and was started on prednisone amoxicillin and another antibiotic

## 2017-02-14 ENCOUNTER — Emergency Department (HOSPITAL_BASED_OUTPATIENT_CLINIC_OR_DEPARTMENT_OTHER)
Admission: EM | Admit: 2017-02-14 | Discharge: 2017-02-14 | Disposition: A | Discharge status: Home / Self Care | Payer: 59 | Attending: Physician Assistant | Admitting: Physician Assistant

## 2017-02-14 DIAGNOSIS — R058 Other specified cough: Secondary | ICD-10-CM

## 2017-02-14 DIAGNOSIS — R05 Cough: Secondary | ICD-10-CM

## 2017-02-14 MED ORDER — GUAIFENESIN-CODEINE 100-10 MG/5ML PO SOLN: 5.0000 mL | Freq: Four times a day (QID) | ORAL | 0 refills | Status: DC | PRN

## 2017-02-14 NOTE — ED Provider Notes (Signed)
[facility] Provider Note   CSN: [id number] Arrival date & time: 02/13/17  2254     History   Chief Complaint Chief Complaint  Patient presents with  . Cough    HPI Cindy Turner is a 32 y.o. female.  HPI   Patient is a 32 year old female presenting with allergy fibromyalgia and presenting today with post viral cough.  Patient reports that she is been sick since 17 December.  She has already taken Augmentin and prednisone.  She reports these did not help and that she still continues to have mild cough and congestion.   She reports intermittent fevers.  However patient does not have a fever here and has not had one at the doctor's office.  Patient's exam is very reassuring.  Patient's vital signs are all normal.  Given that she is already had adequate antibiotic treatment for this, I do not think additional antibiotics are required.  Patient's chest x-ray is clear.  We will give her symptomatic care and have her follow-up with her PCP    Past Medical History:  Diagnosis Date  . Allergy   . Fibromyalgia   . Migraines   . Raynaud's disease   . Ureterolithiasis     Patient Active Problem List   Diagnosis Date Noted  . Genetic testing 10/26/2016  . Nausea and vomiting 10/19/2016  . URI, acute 10/19/2016  . Right otitis media 10/19/2016  . Tremor 07/16/2016  . Syncope and collapse 07/16/2016  . Head injury 07/16/2016  . Rib contusion, right, sequela 07/09/2016  . Chronic migraine 04/22/2016  . Chronic pelvic pain in female 01/17/2016  . Endometriosis of pelvic peritoneum 01/17/2016  . Hx of tubal ligation 01/17/2016  . Vaginitis 01/17/2016  . Dysuria 01/16/2016  . Acute bilateral low back pain 01/16/2016  . Painless hematuria 01/16/2016  . Irregular periods 01/16/2016  . Right shoulder injury- ortho 12/25/2015  . Family history of diabetes mellitus 12/25/2015  . OCD (obsessive compulsive disorder) 12/25/2015  . Adjustment  disorder with mixed anxiety and depressed mood 12/25/2015  . Family history of breast cancer in first degree relative- mother age 49 12/25/2015  . Family history of uterine cancer- Mom age 26 12/25/2015  . Family history of malignant melanoma of skin- in 30's 12/25/2015  . Common migraine without intractability 11/25/2015  . Occipital neuralgia of right side 11/25/2015  . Neck pain 11/25/2015  . POTS (postural orthostatic tachycardia syndrome) 11/25/2015  . Lightheadedness 11/25/2015  . Insomnia 11/25/2015  . Periodic limb movement disorder (PLMD) 11/25/2015  . Right wrist injury and R shoulder- injured at work- ortho 09/11/2015  . 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 Fibromyalgia 05/16/2011  . Abdominal pain, acute, right upper quadrant 09/01/2010    Past Surgical History:  Procedure Laterality Date  . APPENDECTOMY    . BREAST BIOPSY    . DILATION AND CURETTAGE OF UTERUS    . kidney stent    . LAPAROSCOPY    . TONSILLECTOMY    . TUBAL LIGATION    . TUBAL LIGATION      OB History    Gravida Para Term Preterm AB Living   3 2 2   1 3    SAB TAB Ectopic Multiple Live Births   1               Home Medications    Prior to Admission medications   Medication Sig Start Date End Date Taking? Authorizing Provider  ALPRAZolam Duanne Moron) 0.5 MG  tablet Take 1 tablet 30 min. before MRI. Take the 2nd tablet with you and take it if needed.  You must not drive or operate dangerous equipment after taking this medication Patient not taking: Reported on 11/30/2016 11/12/16   Sater, Nanine Means, MD  cyclobenzaprine (FLEXERIL) 5 MG tablet Take 1 tablet (5 mg total) by mouth every 8 (eight) hours as needed for muscle spasms. Patient not taking: Reported on 01/25/2017 04/22/16   Sater, Nanine Means, MD  guaiFENesin-codeine 100-10 MG/5ML syrup Take 5 mLs by mouth every 6 (six) hours as needed for cough. 02/14/17   Javonne Dorko Lyn, MD  predniSONE (DELTASONE) 20 MG tablet Take 3 pills a day for 2 days,  2 pills a day for 2 days, 1 pill a day for 2 days then one half pill a day for 2 days then off 01/25/17   Mellody Dance, DO    Family History Family History  Problem Relation Age of Onset  . Hypertension Mother   . Arthritis Mother   . Breast cancer Mother 1  . Uterine cancer Mother   . Legg-Calve-Perthes disease Father   . Healthy Brother   . Asthma Daughter   . Healthy Daughter   . Breast cancer Maternal Grandmother        dx in her 52s  . Throat cancer Maternal Grandfather        smoker; dx in 32s  . Melanoma Paternal Grandfather   . Hypertension Paternal Grandfather   . Diabetes Paternal Grandfather   . Heart attack Paternal Grandfather   . Melanoma Maternal Aunt        dx in her 50s    Social History Social History   Tobacco Use  . Smoking status: Never Smoker  . Smokeless tobacco: Never Used  Substance Use Topics  . Alcohol use: No  . Drug use: No     Allergies   Pineapple; Terbutaline sulfate; Vitamin a; and Latex   Review of Systems Review of Systems  Constitutional: Positive for fatigue and fever. Negative for activity change.  HENT: Positive for congestion and sinus pressure.   Respiratory: Positive for cough. Negative for shortness of breath.   Cardiovascular: Negative for chest pain.  Gastrointestinal: Negative for abdominal pain.     Physical Exam Updated Vital Signs BP 133/77 (BP Location: Left Arm)   Pulse 68   Temp 98.4 F (36.9 C) (Oral)   Resp 18   LMP 02/10/2017   SpO2 100%   Physical Exam  Constitutional: She is oriented to person, place, and time. She appears well-developed and well-nourished.  HENT:  Head: Normocephalic and atraumatic.  Right Ear: External ear normal.  Left Ear: External ear normal.  L TM with mild fluid, R TM normal  Eyes: Conjunctivae and EOM are normal. Pupils are equal, round, and reactive to light. Right eye exhibits no discharge. Left eye exhibits no discharge.  Cardiovascular: Normal rate, regular  rhythm and normal heart sounds.  No murmur heard. Pulmonary/Chest: Effort normal and breath sounds normal. She has no wheezes. She has no rales.  Neurological: She is oriented to person, place, and time.  Skin: Skin is warm and dry. She is not diaphoretic.  Psychiatric: She has a normal mood and affect.  Nursing note and vitals reviewed.    ED Treatments / Results  Labs (all labs ordered are listed, but only abnormal results are displayed) Labs Reviewed - No data to display  EKG  EKG Interpretation None  Radiology Dg Chest 2 View  Result Date: 02/13/2017 CLINICAL DATA:  4 wks w/ cough, congestion, bilat ear infections, and bronchitis. Intermittent fevers. Non smoker. EXAM: CHEST  2 VIEW COMPARISON:  07/16/2016 FINDINGS: The heart size and mediastinal contours are within normal limits. Both lungs are clear. The visualized skeletal structures are unremarkable. IMPRESSION: No active cardiopulmonary disease. Electronically Signed   By: Nolon Nations M.D.   On: 02/13/2017 23:51    Procedures Procedures (including critical care time)  Medications Ordered in ED Medications - No data to display   Initial Impression / Assessment and Plan / ED Course  I have reviewed the triage vital signs and the nursing notes.  Pertinent labs & imaging results that were available during my care of the patient were reviewed by me and considered in my medical decision making (see chart for details).       Patient is a 32 year old female presenting with allergy fibromyalgia and presenting today with post viral cough.  Patient reports that she is been sick since 17 December.  She has already taken Augmentin and prednisone.  She reports these did not help and that she still continues to have mild cough and congestion.   She reports intermittent fevers.  However patient does not have a fever here and has not had one at the doctor's office.  Patient's exam is very reassuring.  Patient's vital  signs are all normal.  Given that she is already had adequate antibiotic treatment for this, I do not think additional antibiotics are required.  Patient's chest x-ray is clear.  We will give her symptomatic care and have her follow-up with her PCP    Final Clinical Impressions(s) / ED Diagnoses   Final diagnoses:  Post-viral cough syndrome    ED Discharge Orders        Ordered    guaiFENesin-codeine 100-10 MG/5ML syrup  Every 6 hours PRN     02/14/17 0235       Macarthur Critchley, MD 02/14/17 [phone]

## 2017-02-14 NOTE — Discharge Instructions (Signed)
We think you have something called post viral cough syndrome.  Occasionally viruses can linger for a long time causing cough afterwards.  Please take the cough syrup at night to help with the cough.  Initially take a Nettie pot to help with decongestion.

## 2017-03-01 ENCOUNTER — Inpatient Hospital Stay: Payer: Self-pay | Admitting: Family Medicine

## 2017-03-04 ENCOUNTER — Encounter: Payer: Self-pay | Admitting: Family Medicine

## 2017-03-04 ENCOUNTER — Ambulatory Visit (INDEPENDENT_AMBULATORY_CARE_PROVIDER_SITE_OTHER): Payer: 59 | Admitting: Family Medicine

## 2017-03-04 VITALS — BP 104/72 | HR 78 | Ht 67.0 in | Wt 134.0 lb

## 2017-03-04 DIAGNOSIS — J029 Acute pharyngitis, unspecified: Secondary | ICD-10-CM

## 2017-03-04 DIAGNOSIS — J069 Acute upper respiratory infection, unspecified: Secondary | ICD-10-CM | POA: Diagnosis not present

## 2017-03-04 DIAGNOSIS — M791 Myalgia, unspecified site: Secondary | ICD-10-CM

## 2017-03-04 DIAGNOSIS — R5383 Other fatigue: Secondary | ICD-10-CM

## 2017-03-04 DIAGNOSIS — R509 Fever, unspecified: Secondary | ICD-10-CM | POA: Diagnosis not present

## 2017-03-04 LAB — POCT URINALYSIS DIPSTICK
Bilirubin, UA: NEGATIVE
GLUCOSE UA: NEGATIVE
Ketones, UA: 80
NITRITE UA: NEGATIVE
PROTEIN UA: NEGATIVE
Spec Grav, UA: 1.025 (ref 1.010–1.025)
Urobilinogen, UA: 1 E.U./dL
pH, UA: 6.5 (ref 5.0–8.0)

## 2017-03-04 LAB — POCT RAPID STREP A (OFFICE): RAPID STREP A SCREEN: NEGATIVE

## 2017-03-04 LAB — POCT INFLUENZA A/B
Influenza A, POC: NEGATIVE
Influenza B, POC: NEGATIVE

## 2017-03-04 NOTE — Progress Notes (Signed)
Acute Care Office visit  Assessment and plan:  1. Viral upper respiratory tract infection   2. Fever and chills   3. Fatigue, unspecified type   4. Pharyngitis, unspecified etiology   5. Myalgias- generalized       Will order labs today. Continue supportive care. Explained to pt not sure what else to rule out. If this is benign, will consider sending to infectious disease.   - Viral vs Allergic vs Bacterial causes for pt's symptoms reveiwed.    - Supportive care and various OTC medications discussed in addition to any prescribed. - Call or RTC if new symptoms, or if no improvement or worse over next several days.   - Will consider ABX if sx continue past 10 days and worsening.   Orders Placed This Encounter  Procedures  . Urine Culture  . Mononucleosis Test, Qual W/ Reflex  . CBC with Differential/Platelet  . Comprehensive metabolic panel  . POCT Influenza A/B  . POCT rapid strep A  . POCT urinalysis dipstick    Gross side effects, risk and benefits, and alternatives of medications discussed with patient.  Patient is aware that all medications have potential side effects and we are unable to predict every sideeffect or drug-drug interaction that may occur.  Expresses verbal understanding and consents to current therapy plan and treatment regiment.   Education and routine counseling performed. Handouts provided.  Anticipatory guidance and routine counseling done re: condition, txmnt options and need for follow up. All questions of patient's were answered.  Return for We will await findings of all results and readdress.  Continue supportive care..  Please see AVS handed out to patient at the end of our visit for additional patient instructions/ counseling done pertaining to today's office visit.  Note: This document was partially repared using Dragon voice recognition software and may include unintentional dictation errors.  This document serves as a record of services  personally performed by Mellody Dance, DO. It was created on her behalf by Mayer Masker, a trained medical scribe. The creation of this record is based on the scribe's personal observations and the provider's statements to them.   I have reviewed the above medical documentation for accuracy and completeness and I concur.  Mellody Dance 03/16/17 9:17 AM   Subjective:    Chief Complaint  Patient presents with  . Follow-up    HPI:  Pt presents with Sx for 30+ days.   C/o:  Ear pain, sneezing, HA, sore throat, swollen tongue, intermittent fever (102 max), chills, diaphoresis, and rash. She states she feels "run down" or being "ran over by truck"    Denies: NA    For symptoms patient has tried:  Dayquil, nyquil, sudafed, mucinex, Vitamin C with mild to no relief.  Overall getting:   The same.  She has no sick contacts. Immunizations UTD.  Patient Care Team    Relationship Specialty Notifications Start End  Mellody Dance, DO PCP - General Family Medicine  12/25/15   Reche Dixon, PA-C Consulting Physician Orthopedic Surgery  12/25/15   Adrian Prows, MD Consulting Physician Cardiology  12/25/15   Britt Bottom, MD  Neurology  12/25/15     Past medical history, Surgical history, Family history reviewed and noted below, Social history, Allergies, and Medications have been entered into the medical record, reviewed and changed as needed.   Allergies  Allergen Reactions  . Pineapple Anaphylaxis, Itching and Rash  . Terbutaline Sulfate Other (See Comments)    Arrhythmia  .  Vitamin A Other (See Comments)    Headaches   . Latex Rash    Review of Systems: - see above HPI for pertinent positives General:   No wt loss Pulm:   No DIB, pleuritic chest pain Card:  No CP, palpitations Abd:  No n/v/d or pain Ext:  No inc edema from baseline   Objective:   Blood pressure 104/72, pulse 78, height 5\' 7"  (1.702 m), weight 134 lb (60.8 kg), last menstrual period 02/10/2017. Body  mass index is 20.99 kg/m. General: Well Developed, well nourished, appropriate for stated age.  Neuro: Alert and oriented x3, extra-ocular muscles intact, sensation grossly intact.  HEENT: Normocephalic, atraumatic, pupils equal round reactive to light, neck supple, no masses, no painful lymphadenopathy, TM's intact B/L, no acute findings. Nares- patent, clear d/c, OP- clear, mild erythema, No TTP sinuses.  Skin: Warm and dry, no gross rash. Cardiac: RRR, S1 S2,  no murmurs rubs or gallops.  Respiratory: ECTA B/L and A/P, Not using accessory muscles, speaking in full sentences- unlabored. Vascular:  No gross lower ext edema, cap RF less 2 sec. Psych: No HI/SI, judgement and insight good, Euthymic mood. Full Affect.

## 2017-03-04 NOTE — Patient Instructions (Signed)
We will check CBC with differential, CMP today.  If CMP shows elevated liver enzymes will consider hepatitis panel.  Check mono, flu, strep and urinalysis in office today.  We will send off for culture if remotely positive urine.   Upper respiratory viral infection, Adult Adenoviruses are common viruses that cause many different types of infections. The viruses usually affect the lungs, but they can also affect other parts of the body, including the eyes, stomach, bowels, bladder, and brain. The most common type of adenovirus infection is the common cold. Usually, adenovirus infections are not severe unless you have another health problem that makes it hard for your body to fight off infection. What are the causes? You can get this condition if you:  Touch a surface or object that has an adenovirus on it and then touch your mouth, nose, or eyes with unwashed hands.  Come into close physical contact with an infected person, such as by hugging or shaking hands.  Breathe in droplets that fly through the air when an infected person talks, coughs, or sneezes.  Have contact with infected stool.  Swim in a pool that does not have enough chlorine.  Adenoviruses can live outside the body for many weeks. They spread easily from person to person (are contagious). What increases the risk? This condition is more likely to develop in:  People who spend a lot of time in places where there are many people, such as schools, summer camps, daycare centers, community centers, and [employer] recruit training centers.  Elderly adults.  People with a weak body defense system (immune system).  People with a lung disease.  People with a heart condition.  What are the signs or symptoms? Adenovirus infections usually cause flu-like symptoms. Once the virus gets into the body, symptoms of this condition can take up to 14 days to develop. Symptoms may include:  Headache.  Stiff neck.  Sleepiness or  fatigue.  Confusion or disorientation.  Fever.  Sore throat.  Cough.  Trouble breathing.  Runny nose or congestion.  Pink eye (conjunctivitis).  Bleeding into the covering of the eye.  Stomachache or diarrhea.  Nausea or vomiting.  Blood in the urine or pain while urinating.  Ear pain or fullness.  How is this diagnosed? This condition may be diagnosed based on your symptoms and a physical exam. Your health care provider may order tests to make sure your symptoms are not caused by another type of problem. Tests can include:  Blood tests.  Urine tests.  Stool tests.  Chest X-ray.  Tissue or throat culture.  How is this treated? This condition goes away on its own with time. Treatment for this condition involves managing symptoms until the condition goes away. Your health care provider may recommend:  Rest.  Drinking more fluids.  Taking over-the-counter medicine to help relieve a sore throat, fever, or headache.  Follow these instructions at home:  Rest at home until your symptoms go away.  Drink enough fluid to keep your urine clear or pale yellow.  Take over-the-counter and prescription medicines only as told by your health care provider.  Keep all follow-up visits as told by your health care provider. This is important. How is this prevented? Adenoviruses are resistant to many cleaning products and can remain on surfaces for long periods of time. To help prevent infection:  Wash your hands often with soap and water.  Cover your nose or mouth when you sneeze or cough.  Do not touch your eyes,  nose, or mouth with unwashed hands.  Clean commonly used objects often.  Do not swim in a pool that is not properly chlorinated.  Avoid close contact with people who are sick.  Do not go to school or work when you are sick.  Contact a health care provider if:  Your symptoms do not improve after 10 days.  Your symptoms get worse.  You cannot eat or  drink without vomiting. Get help right away if:  You have trouble breathing or you are breathing rapidly.  Your skin, lips, or fingernails look blue (cyanosis).  You have a rapid heart rate.  You become confused.  You lose consciousness. This information is not intended to replace advice given to you by your health care provider. Make sure you discuss any questions you have with your health care provider. Document Released: 04/18/2002 Document Revised: 09/23/2015 Document Reviewed: 09/23/2015 Elsevier Interactive Patient Education  [employer].

## 2017-03-05 LAB — COMPREHENSIVE METABOLIC PANEL
ALK PHOS: 48 IU/L (ref 39–117)
ALT: 16 IU/L (ref 0–32)
AST: 13 IU/L (ref 0–40)
Albumin/Globulin Ratio: 2 (ref 1.2–2.2)
Albumin: 4.7 g/dL (ref 3.5–5.5)
BILIRUBIN TOTAL: 0.8 mg/dL (ref 0.0–1.2)
BUN / CREAT RATIO: 13 (ref 9–23)
BUN: 10 mg/dL (ref 6–20)
CHLORIDE: 105 mmol/L (ref 96–106)
CO2: 19 mmol/L — AB (ref 20–29)
CREATININE: 0.76 mg/dL (ref 0.57–1.00)
Calcium: 9.3 mg/dL (ref 8.7–10.2)
GFR calc Af Amer: 119 mL/min/{1.73_m2} (ref >59)
GFR calc non Af Amer: 103 mL/min/{1.73_m2} (ref >59)
GLUCOSE: 84 mg/dL (ref 65–99)
Globulin, Total: 2.4 g/dL (ref 1.5–4.5)
Potassium: 4.5 mmol/L (ref 3.5–5.2)
Sodium: 139 mmol/L (ref 134–144)
Total Protein: 7.1 g/dL (ref 6.0–8.5)

## 2017-03-05 LAB — CBC WITH DIFFERENTIAL/PLATELET
BASOS ABS: 0.1 10*3/uL (ref 0.0–0.2)
Basos: 1 %
EOS (ABSOLUTE): 0.1 10*3/uL (ref 0.0–0.4)
Eos: 1 %
HEMOGLOBIN: 13 g/dL (ref 11.1–15.9)
Hematocrit: 40.4 % (ref 34.0–46.6)
Immature Grans (Abs): 0 10*3/uL (ref 0.0–0.1)
Immature Granulocytes: 0 %
Lymphocytes Absolute: 1.1 10*3/uL (ref 0.7–3.1)
Lymphs: 25 %
MCH: 24.2 pg — AB (ref 26.6–33.0)
MCHC: 32.2 g/dL (ref 31.5–35.7)
MCV: 75 fL — ABNORMAL LOW (ref 79–97)
MONOCYTES: 10 %
Monocytes Absolute: 0.5 10*3/uL (ref 0.1–0.9)
Neutrophils Absolute: 2.8 10*3/uL (ref 1.4–7.0)
Neutrophils: 63 %
PLATELETS: 204 10*3/uL (ref 150–379)
RBC: 5.37 x10E6/uL — AB (ref 3.77–5.28)
RDW: 15.4 % (ref 12.3–15.4)
WBC: 4.5 10*3/uL (ref 3.4–10.8)

## 2017-03-05 LAB — MONO QUAL W/RFLX QN: MONO QUAL W/RFLX QN: NEGATIVE

## 2017-03-06 LAB — URINE CULTURE

## 2017-03-08 ENCOUNTER — Telehealth: Payer: Self-pay | Admitting: Family Medicine

## 2017-03-08 ENCOUNTER — Encounter: Payer: Self-pay | Admitting: Family Medicine

## 2017-03-08 NOTE — Telephone Encounter (Signed)
Dr. Raliegh Scarlet notified patient of results through My Chart message. MPulliam, CMA/RT(R)

## 2017-03-08 NOTE — Telephone Encounter (Signed)
Pt called for test results--pls call her husband's cell phone with results [phone]

## 2017-03-30 ENCOUNTER — Ambulatory Visit: Payer: Self-pay | Admitting: Neurology

## 2017-04-06 ENCOUNTER — Ambulatory Visit (INDEPENDENT_AMBULATORY_CARE_PROVIDER_SITE_OTHER): Payer: 59 | Admitting: Family Medicine

## 2017-04-06 ENCOUNTER — Encounter: Payer: Self-pay | Admitting: Family Medicine

## 2017-04-06 ENCOUNTER — Ambulatory Visit: Payer: Self-pay | Admitting: Family

## 2017-04-06 VITALS — BP 104/69 | HR 65 | Temp 98.2°F | Ht 67.0 in | Wt 139.0 lb

## 2017-04-06 DIAGNOSIS — R69 Illness, unspecified: Secondary | ICD-10-CM

## 2017-04-06 DIAGNOSIS — R5383 Other fatigue: Secondary | ICD-10-CM | POA: Diagnosis not present

## 2017-04-06 DIAGNOSIS — J329 Chronic sinusitis, unspecified: Secondary | ICD-10-CM | POA: Diagnosis not present

## 2017-04-06 DIAGNOSIS — R509 Fever, unspecified: Secondary | ICD-10-CM

## 2017-04-06 NOTE — Patient Instructions (Signed)
Please do your sinus rinses twice daily followed by Flonase 1 spray each nostril twice daily.  Take Claritin and/or Zyrtec which should help with the rhinosinusitis-runny nose and head congestion.  Also we are sending you to ear nose and throat as well as infectious disease for further evaluation of your chronic fatigue, fevers of unknown origin and general illness.

## 2017-04-06 NOTE — Progress Notes (Signed)
Impression and Recommendations:    1. Fever of unknown origin (FUO)   2. Fatigue, unspecified type   3. Illness   4. Chronic sinusitis, unspecified location   5. Rhinosinusitis     Discussed with the patient our desire to rule out uncommon causes of infection.  - Ambulatory Referral to Infectious Disease Patient desires specialty referral at this time, for her fatigue and fevers of unknown origin. Patient has seen [place] several times with no improvement with her fevers in the afternoons and night, and excessive fatigue. She has had repeated negative flu, strep, and mono tests.  Sent for evaluation and treatment.  - Ambulatory Referral to ENT.  - In the meantime, advised the patient to begin using her Netipot sinus rinses daily, BID.  Patient may follow these rinses with flonase BID (one spray to each nostril).  Advised that the patient may also incorporate allegra or claritin PRN.    Orders Placed This Encounter  Procedures  . Ambulatory referral to Infectious Disease  . Ambulatory referral to ENT     Gross side effects, risk and benefits, and alternatives of medications and treatment plan in general discussed with patient.  Patient is aware that all medications have potential side effects and we are unable to predict every side effect or drug-drug interaction that may occur.   Patient will call with any questions prior to using medication if they have concerns.  Expresses verbal understanding and consents to current therapy and treatment regimen.  No barriers to understanding were identified.  Red flag symptoms and signs discussed in detail.  Patient expressed understanding regarding what to do in case of emergency\urgent symptoms  Please see AVS handed out to patient at the end of our visit for further patient instructions/ counseling done pertaining to today's office visit.   No Follow-up on file.    Note: This note was prepared with assistance of Dragon voice  recognition software. Occasional wrong-word or sound-a-like substitutions may have occurred due to the inherent limitations of voice recognition software.   This document serves as a record of services personally performed by Mellody Dance, DO. It was created on her behalf by Toni Amend, a trained medical scribe. The creation of this record is based on the scribe's personal observations and the provider's statements to them.   I have reviewed the above medical documentation for accuracy and completeness and I concur.  Mellody Dance 04/06/17 9:16 AM   ------------------------------------------------------------------------------------------------------------------------------------------------------------------------------    Subjective:     HPI: Cindy Turner is a 32 y.o. female who presents to [place] at [facility] today for issues as discussed below.  After all of her negative tests (flu, strep, mono), her same symptoms continue.  Symptoms have persisted for 2 months.  Symptoms Her throat hurts, ears hurt, off and on; she feels like the sickness is in her chest.   She coughs a lot, and has a fever off and on periodically. The fever typically spikes and goes away.  Previously, fever was flaring every day - past two weeks it's been every other day.   Fever went up to 101 yesterday, then fell down and was fine. Continued fatigue and body aches.   History of unusual sleeping habits (this is her norm). Some days she sleeps well, some days she doesn't sleep at all for days.  She notes that she's been this way for years. If she sleeps for 45 minutes she notes she sometimes has more  energy than if she sleeps a whole night.  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 Her own kids have been sick at home as well, and her husband had the flu recently too.  Patient is very low risk.  Denies exposure to nursing homes or  other high risk patients. Doesn't do IV drugs, never has. Denies any past or present high-risk sexual activity.    Wt Readings from Last 3 Encounters:  04/06/17 139 lb (63 kg)  03/04/17 134 lb (60.8 kg)  01/25/17 140 lb 3.2 oz (63.6 kg)   BP Readings from Last 3 Encounters:  04/06/17 104/69  03/04/17 104/72  02/14/17 116/72   Pulse Readings from Last 3 Encounters:  04/06/17 65  03/04/17 78  02/14/17 65   BMI Readings from Last 3 Encounters:  04/06/17 21.77 kg/m  03/04/17 20.99 kg/m  01/25/17 21.96 kg/m     Patient Care Team    Relationship Specialty Notifications Start End  Mellody Dance, DO PCP - General Family Medicine  12/25/15   Reche Dixon, PA-C Consulting Physician Orthopedic Surgery  12/25/15   Adrian Prows, MD Consulting Physician Cardiology  12/25/15   Britt Bottom, MD  Neurology  12/25/15      Patient Active Problem List   Diagnosis Date Noted  . Family history of diabetes mellitus 12/25/2015    Priority: High  . Adjustment disorder with mixed anxiety and depressed mood 12/25/2015    Priority: High  . Genetic testing 10/26/2016  . Nausea and vomiting 10/19/2016  . URI, acute 10/19/2016  . Right otitis media 10/19/2016  . Tremor 07/16/2016  . Syncope and collapse 07/16/2016  . Head injury 07/16/2016  . Rib contusion, right, sequela 07/09/2016  . Chronic migraine 04/22/2016  . Chronic pelvic pain in female 01/17/2016  . Endometriosis of pelvic peritoneum 01/17/2016  . Hx of tubal ligation 01/17/2016  . Vaginitis 01/17/2016  . Dysuria 01/16/2016  . Acute bilateral low back pain 01/16/2016  . Painless hematuria 01/16/2016  . Irregular periods 01/16/2016  . Right shoulder injury- ortho 12/25/2015  . OCD (obsessive compulsive disorder) 12/25/2015  . Family history of breast cancer in first degree relative- mother age 50 12/25/2015  . Family history of uterine cancer- Mom age 15 12/25/2015  . Family history of malignant melanoma of skin- in  30's 12/25/2015  . Common migraine without intractability 11/25/2015  . Occipital neuralgia of right side 11/25/2015  . Neck pain 11/25/2015  . POTS (postural orthostatic tachycardia syndrome) 11/25/2015  . Lightheadedness 11/25/2015  . Insomnia 11/25/2015  . Periodic limb movement disorder (PLMD) 11/25/2015  . Right wrist injury and R shoulder- injured at work- ortho 09/11/2015  . 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 Fibromyalgia 05/16/2011  . Abdominal pain, acute, right upper quadrant 09/01/2010    Past Medical history, Surgical history, Family history, Social history, Allergies and Medications have been entered into the medical record, reviewed and changed as needed.    Current Meds  Medication Sig  . cyclobenzaprine (FLEXERIL) 5 MG tablet Take 1 tablet (5 mg total) by mouth every 8 (eight) hours as needed for muscle spasms.    Allergies:  Allergies  Allergen Reactions  . Pineapple Anaphylaxis, Itching and Rash  . Terbutaline Sulfate Other (See Comments)    Arrhythmia  . Vitamin A Other (See Comments)    Headaches   . Latex Rash     Review of Systems:  A fourteen system review of systems was performed  and found to be positive as per HPI.   Objective:   Blood pressure 104/69, pulse 65, temperature 98.2 F (36.8 C), height 5\' 7"  (1.702 m), weight 139 lb (63 kg), last menstrual period 03/23/2017, SpO2 100 %. Body mass index is 21.77 kg/m. General:  Well Developed, well nourished, appropriate for stated age.  Neuro:  Alert and oriented,  extra-ocular muscles intact  HEENT: TMs bilaterally within normal limits, external auditory canal within normal limits.  No tenderness to palpation sinuses.  Nares clear without discharge, oropharynx-no exudate or erythema.  No lymphadenopathy appreciated anterior cervical periauricular or posterior cervical.  Normocephalic, atraumatic, neck supple Skin:  no gross rash, warm, pink. Cardiac:  RRR, S1 S2 Respiratory:  ECTA B/L and A/P,  Not using accessory muscles, speaking in full sentences- unlabored. Vascular:  Ext warm, no cyanosis apprec.; cap RF less 2 sec. Psych:  No HI/SI, judgement and insight good, Euthymic mood. Full Affect.

## 2017-04-18 DIAGNOSIS — J029 Acute pharyngitis, unspecified: Secondary | ICD-10-CM | POA: Diagnosis not present

## 2017-04-18 DIAGNOSIS — J111 Influenza due to unidentified influenza virus with other respiratory manifestations: Secondary | ICD-10-CM | POA: Diagnosis not present

## 2017-04-26 NOTE — Progress Notes (Deleted)
Subjective:    Patient ID: Cindy Turner, female    DOB: 11-17-1985, 32 y.o.   MRN: [id number]  No chief complaint on file.   HPI:  LATONA KRICHBAUM is a 32 y.o. female who presents today for evaluation of fever.  Ms. Mowrer symptoms started around mid-Decemeber with sore throat, fevers, and cough. Notes described 100 +fever and pain with breathing and "shooting up her spine." Influenza and strep test were negative. She was prescribed Augmentin, prednisone and Ciprodex. She completed the medications as prescribed with no clinical improvement. She was seen in the ED on 02/14/17 with clear chest x-ray and recommended symptomatic care. Seen on 1/24 in primary care and urine culture, mono test, CBC, CMP and Rapid Strep and Influenza were all negative. She was continued on supportive care at that time. She was last seen on 2/26 with continued intermittent fever, fatigue, chills, and feeling run down.   Allergies  Allergen Reactions  . Pineapple Anaphylaxis, Itching and Rash  . Terbutaline Sulfate Other (See Comments)    Arrhythmia  . Vitamin A Other (See Comments)    Headaches   . Latex Rash      Outpatient Medications Prior to Visit  Medication Sig Dispense Refill  . cyclobenzaprine (FLEXERIL) 5 MG tablet Take 1 tablet (5 mg total) by mouth every 8 (eight) hours as needed for muscle spasms. 30 tablet 5   Facility-Administered Medications Prior to Visit  Medication Dose Route Frequency Provider Last Rate Last Dose  . gadopentetate dimeglumine (MAGNEVIST) injection 12 mL  12 mL Intravenous Once PRN Sater, Nanine Means, MD         Past Medical History:  Diagnosis Date  . Allergy   . Fibromyalgia   . Migraines   . Raynaud's disease   . Ureterolithiasis       Past Surgical History:  Procedure Laterality Date  . APPENDECTOMY    . BREAST BIOPSY    . DILATION AND CURETTAGE OF UTERUS    . kidney stent    . LAPAROSCOPY    . TONSILLECTOMY    . TUBAL LIGATION    .  TUBAL LIGATION        Family History  Problem Relation Age of Onset  . Hypertension Mother   . Arthritis Mother   . Breast cancer Mother 16  . Uterine cancer Mother   . Legg-Calve-Perthes disease Father   . Healthy Brother   . Asthma Daughter   . Healthy Daughter   . Breast cancer Maternal Grandmother        dx in her 2s  . Throat cancer Maternal Grandfather        smoker; dx in 44s  . Melanoma Paternal Grandfather   . Hypertension Paternal Grandfather   . Diabetes Paternal Grandfather   . Heart attack Paternal Grandfather   . Melanoma Maternal Aunt        dx in her 57s      Social History   Socioeconomic History  . Marital status: Married    Spouse name: Not on file  . Number of children: Not on file  . Years of education: Not on file  . Highest education level: Not on file  Social Needs  . Financial resource strain: Not on file  . Food insecurity - worry: Not on file  . Food insecurity - inability: Not on file  . Transportation needs - medical: Not on file  . Transportation needs - non-medical: Not on file  Occupational History  .  Not on file  Tobacco Use  . Smoking status: Never Smoker  . Smokeless tobacco: Never Used  Substance and Sexual Activity  . Alcohol use: No  . Drug use: No  . Sexual activity: Yes    Partners: Male    Birth control/protection: Surgical    Comment: tubal ligation  Other Topics Concern  . Not on file  Social History Narrative  . Not on file      Review of Systems     Objective:    There were no vitals taken for this visit. Nursing note and vital signs reviewed.  Physical Exam      Assessment & Plan:   Problem List Items Addressed This Visit    None       I am having Cindy Turner maintain her cyclobenzaprine.   No orders of the defined types were placed in this encounter.    Follow-up: No Follow-up on file.  Mauricio Po, [place] for Infectious Disease

## 2017-04-27 ENCOUNTER — Ambulatory Visit: Payer: Self-pay | Admitting: Family

## 2017-05-02 ENCOUNTER — Emergency Department (HOSPITAL_BASED_OUTPATIENT_CLINIC_OR_DEPARTMENT_OTHER)
Admission: EM | Admit: 2017-05-02 | Discharge: 2017-05-03 | Disposition: A | Discharge status: Home / Self Care | Payer: 59 | Attending: Emergency Medicine | Admitting: Emergency Medicine

## 2017-05-02 ENCOUNTER — Other Ambulatory Visit: Payer: Self-pay

## 2017-05-02 ENCOUNTER — Encounter (HOSPITAL_BASED_OUTPATIENT_CLINIC_OR_DEPARTMENT_OTHER): Payer: Self-pay | Admitting: Emergency Medicine

## 2017-05-02 DIAGNOSIS — R197 Diarrhea, unspecified: Secondary | ICD-10-CM | POA: Diagnosis not present

## 2017-05-02 DIAGNOSIS — R1013 Epigastric pain: Secondary | ICD-10-CM | POA: Insufficient documentation

## 2017-05-02 DIAGNOSIS — R509 Fever, unspecified: Secondary | ICD-10-CM | POA: Insufficient documentation

## 2017-05-02 DIAGNOSIS — R1110 Vomiting, unspecified: Secondary | ICD-10-CM

## 2017-05-02 DIAGNOSIS — R112 Nausea with vomiting, unspecified: Secondary | ICD-10-CM | POA: Diagnosis not present

## 2017-05-02 MED ORDER — SODIUM CHLORIDE 0.9 % IV BOLUS (SEPSIS)
1000.0000 mL | Freq: Once | INTRAVENOUS | Status: AC
  Administered 2017-05-02: 1000 mL via INTRAVENOUS

## 2017-05-02 MED ORDER — DICYCLOMINE HCL 10 MG PO CAPS
10.0000 mg | ORAL_CAPSULE | Freq: Once | ORAL | Status: AC
  Administered 2017-05-03: 10 mg via ORAL
  Filled 2017-05-02: qty 1

## 2017-05-02 MED ORDER — ONDANSETRON HCL 4 MG/2ML IJ SOLN
4.0000 mg | Freq: Once | INTRAMUSCULAR | Status: AC | PRN
  Administered 2017-05-02: 4 mg via INTRAVENOUS
  Filled 2017-05-02: qty 2

## 2017-05-02 MED ORDER — GI COCKTAIL ~~LOC~~
30.0000 mL | Freq: Once | ORAL | Status: AC
  Administered 2017-05-03: 30 mL via ORAL
  Filled 2017-05-02: qty 30

## 2017-05-02 NOTE — ED Triage Notes (Signed)
Patient states that she has had N/V/D starting at about 2 am

## 2017-05-02 NOTE — ED Provider Notes (Signed)
[facility] EMERGENCY DEPARTMENT Provider Note   CSN: [id number] Arrival date & time: 05/02/17  2207     History   Chief Complaint Chief Complaint  Patient presents with  . Emesis    HPI Cindy Turner is a 33 y.o. female.  HPI  This is a 32 year old female who presents with nausea, vomiting, diarrhea times 1 day.  Patient reports that she woke up at midnight with vomiting and diarrhea.  She reports multiple episodes of nonbilious, nonbloody emesis.  She reports myalgias and epigastric abdominal pain.  She reports pain is sharp and nonradiating.  It is epigastric.  Nothing seems to make it better or worse.  She has not taken anything for her symptoms.  Currently she rates her pain 8 out of 10.  Daughter is being evaluated for similar symptoms.  Reports temperature 100.0.  Recent diagnosis of the flu.  Denies any chest pain, shortness of breath, urinary symptoms.  Past Medical History:  Diagnosis Date  . Allergy   . Fibromyalgia   . Migraines   . Raynaud's disease   . Ureterolithiasis     Patient Active Problem List   Diagnosis Date Noted  . Genetic testing 10/26/2016  . Nausea and vomiting 10/19/2016  . URI, acute 10/19/2016  . Right otitis media 10/19/2016  . Tremor 07/16/2016  . Syncope and collapse 07/16/2016  . Head injury 07/16/2016  . Rib contusion, right, sequela 07/09/2016  . Chronic migraine 04/22/2016  . Chronic pelvic pain in female 01/17/2016  . Endometriosis of pelvic peritoneum 01/17/2016  . Hx of tubal ligation 01/17/2016  . Vaginitis 01/17/2016  . Dysuria 01/16/2016  . Acute bilateral low back pain 01/16/2016  . Painless hematuria 01/16/2016  . Irregular periods 01/16/2016  . Right shoulder injury- ortho 12/25/2015  . Family history of diabetes mellitus 12/25/2015  . OCD (obsessive compulsive disorder) 12/25/2015  . Adjustment disorder with mixed anxiety and depressed mood 12/25/2015  . Family history of breast cancer in first  degree relative- mother age 2 12/25/2015  . Family history of uterine cancer- Mom age 42 12/25/2015  . Family history of malignant melanoma of skin- in 30's 12/25/2015  . Common migraine without intractability 11/25/2015  . Occipital neuralgia of right side 11/25/2015  . Neck pain 11/25/2015  . POTS (postural orthostatic tachycardia syndrome) 11/25/2015  . Lightheadedness 11/25/2015  . Insomnia 11/25/2015  . Periodic limb movement disorder (PLMD) 11/25/2015  . Right wrist injury and R shoulder- injured at work- ortho 09/11/2015  . 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 Fibromyalgia 05/16/2011  . Abdominal pain, acute, right upper quadrant 09/01/2010    Past Surgical History:  Procedure Laterality Date  . APPENDECTOMY    . BREAST BIOPSY    . DILATION AND CURETTAGE OF UTERUS    . kidney stent    . LAPAROSCOPY    . TONSILLECTOMY    . TUBAL LIGATION    . TUBAL LIGATION       OB History    Gravida  3   Para  2   Term  2   Preterm      AB  1   Living  3     SAB  1   TAB      Ectopic      Multiple      Live Births               Home Medications    Prior to Admission medications   Medication Sig  Start Date End Date Taking? Authorizing Provider  cyclobenzaprine (FLEXERIL) 5 MG tablet Take 1 tablet (5 mg total) by mouth every 8 (eight) hours as needed for muscle spasms. 04/22/16   Sater, Nanine Means, MD  loperamide (IMODIUM) 2 MG capsule Take 1 capsule (2 mg total) by mouth 4 (four) times daily as needed for diarrhea or loose stools. 05/03/17   Jahir Halt, Barbette Hair, MD  ondansetron (ZOFRAN ODT) 4 MG disintegrating tablet Take 1 tablet (4 mg total) by mouth every 8 (eight) hours as needed for nausea or vomiting. 05/03/17   Blakely Maranan, Barbette Hair, MD    Family History Family History  Problem Relation Age of Onset  . Hypertension Mother   . Arthritis Mother   . Breast cancer Mother 61  . Uterine cancer Mother   . Legg-Calve-Perthes disease Father   . Healthy Brother    . Asthma Daughter   . Healthy Daughter   . Breast cancer Maternal Grandmother        dx in her 2s  . Throat cancer Maternal Grandfather        smoker; dx in 35s  . Melanoma Paternal Grandfather   . Hypertension Paternal Grandfather   . Diabetes Paternal Grandfather   . Heart attack Paternal Grandfather   . Melanoma Maternal Aunt        dx in her 84s    Social History Social History   Tobacco Use  . Smoking status: Never Smoker  . Smokeless tobacco: Never Used  Substance Use Topics  . Alcohol use: No  . Drug use: No     Allergies   Pineapple; Terbutaline sulfate; Vitamin a; and Latex   Review of Systems Review of Systems  Constitutional: Negative for fever.  Respiratory: Negative for shortness of breath.   Cardiovascular: Negative for chest pain.  Gastrointestinal: Positive for abdominal pain, diarrhea, nausea and vomiting.  Genitourinary: Negative for dysuria and hematuria.  Musculoskeletal: Negative for back pain.  Skin: Negative for rash.  All other systems reviewed and are negative.    Physical Exam Updated Vital Signs BP 114/72 (BP Location: Left Arm)   Pulse 96   Temp 97.9 F (36.6 C) (Oral)   Resp 18   Ht 5\' 7"  (1.702 m)   Wt 61.2 kg (135 lb)   LMP 05/02/2017   SpO2 100%   BMI 21.14 kg/m   Physical Exam  Constitutional: She is oriented to person, place, and time. She appears well-developed and well-nourished. No distress.  HENT:  Head: Normocephalic and atraumatic.  Cardiovascular: Regular rhythm and normal heart sounds.  Tachycardia  Pulmonary/Chest: Effort normal. No respiratory distress. She has no wheezes.  Abdominal: Soft. Bowel sounds are normal. There is tenderness. There is no rebound and no guarding.  Diffuse tenderness to palpation, no rebound or guarding  Neurological: She is alert and oriented to person, place, and time.  Skin: Skin is warm and dry.  Psychiatric: She has a normal mood and affect.  Nursing note and vitals  reviewed.    ED Treatments / Results  Labs (all labs ordered are listed, but only abnormal results are displayed) Labs Reviewed  CBC WITH DIFFERENTIAL/PLATELET - Abnormal; Notable for the following components:      Result Value   RBC 5.24 (*)    MCV 74.2 (*)    MCH 24.6 (*)    Lymphs Abs 0.5 (*)    All other components within normal limits  COMPREHENSIVE METABOLIC PANEL - Abnormal; Notable for the following components:  Potassium 3.2 (*)    Calcium 8.7 (*)    ALT 13 (*)    All other components within normal limits  HCG, QUANTITATIVE, PREGNANCY  LIPASE, BLOOD  URINALYSIS, ROUTINE W REFLEX MICROSCOPIC  PREGNANCY, URINE    EKG None  Radiology No results found.  Procedures Procedures (including critical care time)  Medications Ordered in ED Medications  ondansetron (ZOFRAN) injection 4 mg (4 mg Intravenous Given 05/02/17 2304)  dicyclomine (BENTYL) capsule 10 mg (10 mg Oral Given 05/03/17 0001)  gi cocktail (Maalox,Lidocaine,Donnatal) (30 mLs Oral Given 05/03/17 0002)  sodium chloride 0.9 % bolus 1,000 mL (0 mLs Intravenous Stopped 05/03/17 0016)  promethazine (PHENERGAN) injection 25 mg (25 mg Intravenous Given 05/03/17 0043)     Initial Impression / Assessment and Plan / ED Course  I have reviewed the triage vital signs and the nursing notes.  Pertinent labs & imaging results that were available during my care of the patient were reviewed by me and considered in my medical decision making (see chart for details).  Clinical Course as of May 03 205  Mon May 03, 2017  0045 Continues to dry heave.  Patient given Phenergan.  Unable to obtain urine.  For this reason, blood work obtained.   [CH]    Clinical Course User Index [CH] Mavis Fichera, Barbette Hair, MD    Patient presents with nausea, vomiting, diarrhea, abdominal pain.  She is overall not tachycardic.  Afebrile.  Her exam is fairly reassuring.  She does have some diffuse tenderness but no point tenderness or peritoneal  symptoms.  Given her daughter's similar symptoms, suspect viral etiology.  Initial urinalysis and urine pregnancy obtained.  Patient was given fluids and nausea medication.  She was unable to provide a urine sample.  For this reason, blood work was obtained.  Blood work is largely reassuring.  Beta hCG is negative.  Patient was dosed Phenergan.  On recheck, she is sleepy but feels better.  She is tolerating sips of fluid.  Recommend supportive measures at home.  After history, exam, and medical workup I feel the patient has been appropriately medically screened and is safe for discharge home. Pertinent diagnoses were discussed with the patient. Patient was given return precautions.   Final Clinical Impressions(s) / ED Diagnoses   Final diagnoses:  Vomiting and diarrhea    ED Discharge Orders        Ordered    ondansetron (ZOFRAN ODT) 4 MG disintegrating tablet  Every 8 hours PRN     05/03/17 0207    loperamide (IMODIUM) 2 MG capsule  4 times daily PRN     05/03/17 0207       Merryl Hacker, MD 05/03/17 [phone]

## 2017-05-03 LAB — CBC WITH DIFFERENTIAL/PLATELET
BASOS PCT: 0 %
Basophils Absolute: 0 10*3/uL (ref 0.0–0.1)
EOS ABS: 0.1 10*3/uL (ref 0.0–0.7)
EOS PCT: 1 %
HEMATOCRIT: 38.9 % (ref 36.0–46.0)
HEMOGLOBIN: 12.9 g/dL (ref 12.0–15.0)
LYMPHS ABS: 0.5 10*3/uL — AB (ref 0.7–4.0)
Lymphocytes Relative: 8 %
MCH: 24.6 pg — ABNORMAL LOW (ref 26.0–34.0)
MCHC: 33.2 g/dL (ref 30.0–36.0)
MCV: 74.2 fL — AB (ref 78.0–100.0)
MONO ABS: 0.3 10*3/uL (ref 0.1–1.0)
MONOS PCT: 5 %
Neutro Abs: 5.2 10*3/uL (ref 1.7–7.7)
Neutrophils Relative %: 86 %
Platelets: 167 10*3/uL (ref 150–400)
RBC: 5.24 MIL/uL — ABNORMAL HIGH (ref 3.87–5.11)
RDW: 14.7 % (ref 11.5–15.5)
WBC: 6.1 10*3/uL (ref 4.0–10.5)

## 2017-05-03 LAB — COMPREHENSIVE METABOLIC PANEL
ALBUMIN: 4 g/dL (ref 3.5–5.0)
ALK PHOS: 49 U/L (ref 38–126)
ALT: 13 U/L — ABNORMAL LOW (ref 14–54)
AST: 17 U/L (ref 15–41)
Anion gap: 9 (ref 5–15)
BILIRUBIN TOTAL: 1 mg/dL (ref 0.3–1.2)
BUN: 14 mg/dL (ref 6–20)
CALCIUM: 8.7 mg/dL — AB (ref 8.9–10.3)
CO2: 22 mmol/L (ref 22–32)
Chloride: 106 mmol/L (ref 101–111)
Creatinine, Ser: 0.77 mg/dL (ref 0.44–1.00)
GFR calc Af Amer: >60 mL/min (ref >60)
GLUCOSE: 97 mg/dL (ref 65–99)
POTASSIUM: 3.2 mmol/L — AB (ref 3.5–5.1)
Sodium: 137 mmol/L (ref 135–145)
TOTAL PROTEIN: 7.1 g/dL (ref 6.5–8.1)

## 2017-05-03 LAB — LIPASE, BLOOD: LIPASE: 27 U/L (ref 11–51)

## 2017-05-03 LAB — HCG, QUANTITATIVE, PREGNANCY: hCG, Beta Chain, Quant, S: <1 m[IU]/mL (ref <5)

## 2017-05-03 MED ORDER — LOPERAMIDE HCL 2 MG PO CAPS: 2.0000 mg | ORAL_CAPSULE | Freq: Four times a day (QID) | ORAL | 0 refills | Status: DC | PRN

## 2017-05-03 MED ORDER — ONDANSETRON 4 MG PO TBDP: 4.0000 mg | ORAL_TABLET | Freq: Three times a day (TID) | ORAL | 0 refills | Status: DC | PRN

## 2017-05-03 MED ORDER — PROMETHAZINE HCL 25 MG/ML IJ SOLN
25.0000 mg | Freq: Once | INTRAMUSCULAR | Status: AC
  Administered 2017-05-03: 25 mg via INTRAVENOUS
  Filled 2017-05-03: qty 1

## 2017-05-03 NOTE — Discharge Instructions (Addendum)
You were seen today for vomiting, diarrhea, and abdominal pain.  Your workup is reassuring.  This is likely related to a viral illness.  Make sure that you are staying hydrated.  Take Zofran and Imodium as needed for your symptoms.

## 2017-05-03 NOTE — ED Notes (Signed)
Pt and family given d/c instructions as per chart. Rx x 2. Verbalizes understanding. No questions. 

## 2017-05-03 NOTE — ED Notes (Signed)
Pt tolerating fluids.   

## 2017-05-03 NOTE — ED Notes (Signed)
Cindy Turner in lab made aware of new orders for blood holding in lab

## 2017-05-03 NOTE — ED Notes (Signed)
Pt given gingerale for fluid challenge. 

## 2017-05-23 ENCOUNTER — Encounter: Payer: Self-pay | Admitting: Family Medicine

## 2017-07-26 DIAGNOSIS — R109 Unspecified abdominal pain: Secondary | ICD-10-CM | POA: Diagnosis not present

## 2017-07-26 DIAGNOSIS — M549 Dorsalgia, unspecified: Secondary | ICD-10-CM | POA: Diagnosis not present

## 2017-07-26 DIAGNOSIS — J0140 Acute pansinusitis, unspecified: Secondary | ICD-10-CM | POA: Diagnosis not present

## 2017-08-04 ENCOUNTER — Ambulatory Visit: Payer: Self-pay | Admitting: Family Medicine

## 2017-08-27 DIAGNOSIS — R102 Pelvic and perineal pain: Secondary | ICD-10-CM | POA: Diagnosis not present

## 2017-08-27 DIAGNOSIS — Z01419 Encounter for gynecological examination (general) (routine) without abnormal findings: Secondary | ICD-10-CM | POA: Diagnosis not present

## 2017-08-27 DIAGNOSIS — N803 Endometriosis of pelvic peritoneum: Secondary | ICD-10-CM | POA: Diagnosis not present

## 2017-09-07 DIAGNOSIS — R87610 Atypical squamous cells of undetermined significance on cytologic smear of cervix (ASC-US): Secondary | ICD-10-CM | POA: Insufficient documentation

## 2017-09-21 DIAGNOSIS — N83201 Unspecified ovarian cyst, right side: Secondary | ICD-10-CM | POA: Diagnosis not present

## 2017-09-21 DIAGNOSIS — R1031 Right lower quadrant pain: Secondary | ICD-10-CM | POA: Insufficient documentation

## 2017-10-09 DIAGNOSIS — J069 Acute upper respiratory infection, unspecified: Secondary | ICD-10-CM | POA: Diagnosis not present

## 2017-10-09 DIAGNOSIS — H938X1 Other specified disorders of right ear: Secondary | ICD-10-CM | POA: Diagnosis not present

## 2017-10-09 DIAGNOSIS — R0989 Other specified symptoms and signs involving the circulatory and respiratory systems: Secondary | ICD-10-CM | POA: Diagnosis not present

## 2017-11-29 DIAGNOSIS — B373 Candidiasis of vulva and vagina: Secondary | ICD-10-CM | POA: Diagnosis not present

## 2017-11-29 DIAGNOSIS — B3731 Acute candidiasis of vulva and vagina: Secondary | ICD-10-CM | POA: Insufficient documentation

## 2017-11-29 DIAGNOSIS — R1031 Right lower quadrant pain: Secondary | ICD-10-CM | POA: Diagnosis not present

## 2017-11-29 DIAGNOSIS — N83202 Unspecified ovarian cyst, left side: Secondary | ICD-10-CM | POA: Diagnosis not present

## 2017-11-29 DIAGNOSIS — Z87442 Personal history of urinary calculi: Secondary | ICD-10-CM | POA: Insufficient documentation

## 2017-12-31 ENCOUNTER — Encounter (HOSPITAL_BASED_OUTPATIENT_CLINIC_OR_DEPARTMENT_OTHER): Payer: Self-pay | Admitting: *Deleted

## 2017-12-31 ENCOUNTER — Ambulatory Visit (HOSPITAL_COMMUNITY)
Admission: EM | Admit: 2017-12-31 | Discharge: 2017-12-31 | Disposition: A | Discharge status: Home / Self Care | Payer: 59 | Source: Home / Self Care

## 2017-12-31 ENCOUNTER — Other Ambulatory Visit: Payer: Self-pay

## 2017-12-31 ENCOUNTER — Encounter (HOSPITAL_COMMUNITY): Payer: Self-pay | Admitting: *Deleted

## 2017-12-31 ENCOUNTER — Emergency Department (HOSPITAL_BASED_OUTPATIENT_CLINIC_OR_DEPARTMENT_OTHER): Payer: 59

## 2017-12-31 ENCOUNTER — Emergency Department (HOSPITAL_BASED_OUTPATIENT_CLINIC_OR_DEPARTMENT_OTHER)
Admission: EM | Admit: 2017-12-31 | Discharge: 2017-12-31 | Disposition: A | Discharge status: Home / Self Care | Payer: 59 | Attending: Emergency Medicine | Admitting: Emergency Medicine

## 2017-12-31 DIAGNOSIS — R1011 Right upper quadrant pain: Secondary | ICD-10-CM

## 2017-12-31 DIAGNOSIS — R197 Diarrhea, unspecified: Secondary | ICD-10-CM | POA: Insufficient documentation

## 2017-12-31 DIAGNOSIS — R110 Nausea: Secondary | ICD-10-CM

## 2017-12-31 DIAGNOSIS — A09 Infectious gastroenteritis and colitis, unspecified: Secondary | ICD-10-CM | POA: Diagnosis not present

## 2017-12-31 DIAGNOSIS — R112 Nausea with vomiting, unspecified: Secondary | ICD-10-CM

## 2017-12-31 DIAGNOSIS — R630 Anorexia: Secondary | ICD-10-CM | POA: Diagnosis not present

## 2017-12-31 DIAGNOSIS — Z91040 Latex allergy status: Secondary | ICD-10-CM | POA: Diagnosis not present

## 2017-12-31 LAB — URINALYSIS, ROUTINE W REFLEX MICROSCOPIC
Bilirubin Urine: NEGATIVE
GLUCOSE, UA: NEGATIVE mg/dL
Ketones, ur: NEGATIVE mg/dL
LEUKOCYTES UA: NEGATIVE
Nitrite: NEGATIVE
Protein, ur: NEGATIVE mg/dL
Specific Gravity, Urine: >1.03 — ABNORMAL HIGH (ref 1.005–1.030)
pH: 5.5 (ref 5.0–8.0)

## 2017-12-31 LAB — CBC WITH DIFFERENTIAL/PLATELET
Abs Immature Granulocytes: 0.01 10*3/uL (ref 0.00–0.07)
Basophils Absolute: 0 10*3/uL (ref 0.0–0.1)
Basophils Relative: 1 %
EOS ABS: 0.1 10*3/uL (ref 0.0–0.5)
Eosinophils Relative: 2 %
HCT: 41.5 % (ref 36.0–46.0)
Hemoglobin: 12.5 g/dL (ref 12.0–15.0)
Immature Granulocytes: 0 %
LYMPHS ABS: 1.2 10*3/uL (ref 0.7–4.0)
LYMPHS PCT: 39 %
MCH: 22.7 pg — AB (ref 26.0–34.0)
MCHC: 30.1 g/dL (ref 30.0–36.0)
MCV: 75.3 fL — AB (ref 80.0–100.0)
Monocytes Absolute: 0.5 10*3/uL (ref 0.1–1.0)
Monocytes Relative: 17 %
NEUTROS PCT: 41 %
NRBC: 0 % (ref 0.0–0.2)
Neutro Abs: 1.3 10*3/uL — ABNORMAL LOW (ref 1.7–7.7)
PLATELETS: 179 10*3/uL (ref 150–400)
RBC: 5.51 MIL/uL — AB (ref 3.87–5.11)
RDW: 15.1 % (ref 11.5–15.5)
WBC: 3 10*3/uL — ABNORMAL LOW (ref 4.0–10.5)

## 2017-12-31 LAB — COMPREHENSIVE METABOLIC PANEL
ALK PHOS: 42 U/L (ref 38–126)
ALT: 19 U/L (ref 0–44)
ANION GAP: 7 (ref 5–15)
AST: 21 U/L (ref 15–41)
Albumin: 3.6 g/dL (ref 3.5–5.0)
BUN: 7 mg/dL (ref 6–20)
CALCIUM: 9.1 mg/dL (ref 8.9–10.3)
CHLORIDE: 106 mmol/L (ref 98–111)
CO2: 23 mmol/L (ref 22–32)
CREATININE: 0.74 mg/dL (ref 0.44–1.00)
Glucose, Bld: 87 mg/dL (ref 70–99)
Potassium: 4.2 mmol/L (ref 3.5–5.1)
Sodium: 136 mmol/L (ref 135–145)
Total Bilirubin: 0.5 mg/dL (ref 0.3–1.2)
Total Protein: 7.1 g/dL (ref 6.5–8.1)

## 2017-12-31 LAB — PREGNANCY, URINE: Preg Test, Ur: NEGATIVE

## 2017-12-31 LAB — URINALYSIS, MICROSCOPIC (REFLEX)

## 2017-12-31 LAB — LIPASE, BLOOD: LIPASE: 28 U/L (ref 11–51)

## 2017-12-31 MED ORDER — DICYCLOMINE HCL 20 MG PO TABS: 20.0000 mg | ORAL_TABLET | Freq: Two times a day (BID) | ORAL | 0 refills | Status: DC | PRN

## 2017-12-31 MED ORDER — ONDANSETRON 8 MG PO TBDP
8.0000 mg | ORAL_TABLET | Freq: Once | ORAL | Status: AC
  Administered 2017-12-31: 8 mg via ORAL
  Filled 2017-12-31: qty 1

## 2017-12-31 MED ORDER — CIPROFLOXACIN HCL 500 MG PO TABS: 500.0000 mg | ORAL_TABLET | Freq: Two times a day (BID) | ORAL | 0 refills | Status: AC

## 2017-12-31 MED ORDER — DICYCLOMINE HCL 10 MG PO CAPS
10.0000 mg | ORAL_CAPSULE | Freq: Once | ORAL | Status: AC
  Administered 2017-12-31: 10 mg via ORAL
  Filled 2017-12-31: qty 1

## 2017-12-31 MED ORDER — METOCLOPRAMIDE HCL 5 MG/ML IJ SOLN
10.0000 mg | Freq: Once | INTRAMUSCULAR | Status: AC
Start: 2017-12-31 — End: 2017-12-31
  Administered 2017-12-31: 10 mg via INTRAVENOUS
  Filled 2017-12-31: qty 2

## 2017-12-31 MED ORDER — SODIUM CHLORIDE 0.9 % IV BOLUS
1000.0000 mL | Freq: Once | INTRAVENOUS | Status: AC
  Administered 2017-12-31: 1000 mL via INTRAVENOUS

## 2017-12-31 MED FILL — DICYCLOMINE 20 MG TABLET: 20 | 10 days supply | Qty: 20 | Fill #0

## 2017-12-31 MED FILL — CIPROFLOXACIN HCL 500 MG TA: 500 | 7 days supply | Qty: 14 | Fill #0

## 2017-12-31 NOTE — ED Notes (Signed)
Patient is going to go to [place] with husband. Patient verbalized understanding that symptoms warrant more testing and interventions than we can do here at Urgent Care. Patient had 1 episode of diarrhea since arrival. Patient appears uncomfortable.

## 2017-12-31 NOTE — ED Triage Notes (Addendum)
Patient reports diarrhea, vomiting, and fever since Tuesday. Patient reports watery diarrhea, that almost been constant since yesterday. Patient has been taking imodium 2mg  4 times a day. Patient reports RUQ pain that radiates around back with generalized discomfort. Patient reports sharp pains when eating to RUQ. No fever today. Patient reports unable to drink PO fluids due to vomiting and nausea. Patient also reports lower leg cramping.

## 2017-12-31 NOTE — Discharge Instructions (Signed)
You have been seen in the Emergency Department (ED) for abdominal pain.  Your evaluation did not identify a clear cause of your symptoms but was generally reassuring.  You may start taking the antibiotic, Cipro, if not feeling better by Sunday. Use the Zofran and Imodium as needed and try to stay hydrated at home.   Please follow up as instructed above regarding todays emergent visit and the symptoms that are bothering you.  Return to the ED if your abdominal pain worsens or fails to improve, you develop bloody vomiting, bloody diarrhea, you are unable to tolerate fluids due to vomiting, fever greater than 101, or other symptoms that concern you.

## 2017-12-31 NOTE — ED Triage Notes (Signed)
Sudden pain to RUQ that radiates to her back all the way to her right shoulder and multiple diarrhea and vomiting since Tuesday.

## 2017-12-31 NOTE — Discharge Instructions (Addendum)
We need to send you down to the emergency room because of the abdominal pain and dehydration with muscle cramps.  They are better able to discern the cause of this problem and treated properly.

## 2017-12-31 NOTE — ED Notes (Signed)
ED Provider at bedside. 

## 2017-12-31 NOTE — ED Provider Notes (Signed)
[facility]    CSN: [id number] Arrival date & time: 12/31/17  0849     History   Chief Complaint Chief Complaint  Patient presents with  . Emesis  . Diarrhea  . Fever    HPI Cindy Turner is a 32 y.o. female.   This is an established urgent care patient.Patient reports diarrhea, vomiting, and fever since Tuesday. Patient reports watery diarrhea, that almost been constant since yesterday. Patient has been taking imodium 2mg  4 times a day. Patient reports RUQ pain that radiates around back with generalized discomfort. Patient reports sharp pains when eating to RUQ. No fever today. Patient reports unable to drink PO fluids due to vomiting and nausea. Patient also reports lower leg cramping.   Patient works in a daycare.  Her daughter was sick last week with vomiting.  Patient last vomited Wednesday night.  She is afraid to eat anything because it causes immediate right upper quadrant pain.     Past Medical History:  Diagnosis Date  . Allergy   . Fibromyalgia   . Migraines   . Raynaud's disease   . Ureterolithiasis     Patient Active Problem List   Diagnosis Date Noted  . Fibromyalgia 05/16/2011    Priority: High  . Genetic testing 10/26/2016  . Nausea and vomiting 10/19/2016  . URI, acute 10/19/2016  . Right otitis media 10/19/2016  . Tremor 07/16/2016  . Syncope and collapse 07/16/2016  . Head injury 07/16/2016  . Rib contusion, right, sequela 07/09/2016  . Chronic migraine 04/22/2016  . Chronic pelvic pain in female 01/17/2016  . Endometriosis of pelvic peritoneum 01/17/2016  . Hx of tubal ligation 01/17/2016  . Vaginitis 01/17/2016  . Dysuria 01/16/2016  . Acute bilateral low back pain 01/16/2016  . Painless hematuria 01/16/2016  . Irregular periods 01/16/2016  . Right shoulder injury- ortho 12/25/2015  . Family history of diabetes mellitus 12/25/2015  . OCD (obsessive compulsive disorder) 12/25/2015  . Adjustment disorder with mixed  anxiety and depressed mood 12/25/2015  . Family history of breast cancer in first degree relative- mother age 21 12/25/2015  . Family history of uterine cancer- Mom age 65 12/25/2015  . Family history of malignant melanoma of skin- in 30's 12/25/2015  . Common migraine without intractability 11/25/2015  . Occipital neuralgia of right side 11/25/2015  . Neck pain 11/25/2015  . POTS (postural orthostatic tachycardia syndrome) 11/25/2015  . Lightheadedness 11/25/2015  . Insomnia 11/25/2015  . Periodic limb movement disorder (PLMD) 11/25/2015  . Right wrist injury and R shoulder- injured at work- ortho 09/11/2015  . 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 Abdominal pain, acute, right upper quadrant 09/01/2010    Past Surgical History:  Procedure Laterality Date  . APPENDECTOMY    . BREAST BIOPSY    . DILATION AND CURETTAGE OF UTERUS    . kidney stent    . LAPAROSCOPY    . TONSILLECTOMY    . TUBAL LIGATION    . TUBAL LIGATION      OB History    Gravida  3   Para  2   Term  2   Preterm      AB  1   Living  3     SAB  1   TAB      Ectopic      Multiple      Live Births               Home Medications  Prior to Admission medications   Medication Sig Start Date End Date Taking? Authorizing Provider  loperamide (IMODIUM) 2 MG capsule Take 1 capsule (2 mg total) by mouth 4 (four) times daily as needed for diarrhea or loose stools. 05/03/17  Yes Horton, Barbette Hair, MD    Family History Family History  Problem Relation Age of Onset  . Hypertension Mother   . Arthritis Mother   . Breast cancer Mother 32  . Uterine cancer Mother   . Legg-Calve-Perthes disease Father   . Healthy Brother   . Asthma Daughter   . Healthy Daughter   . Breast cancer Maternal Grandmother        dx in her 79s  . Throat cancer Maternal Grandfather        smoker; dx in 70s  . Melanoma Paternal Grandfather   . Hypertension Paternal Grandfather   . Diabetes Paternal Grandfather     . Heart attack Paternal Grandfather   . Melanoma Maternal Aunt        dx in her 72s    Social History Social History   Tobacco Use  . Smoking status: Never Smoker  . Smokeless tobacco: Never Used  Substance Use Topics  . Alcohol use: No  . Drug use: No     Allergies   Pineapple; Terbutaline sulfate; Vitamin a; and Latex   Review of Systems Review of Systems  Gastrointestinal: Positive for abdominal pain, diarrhea, nausea and vomiting.  Musculoskeletal: Positive for myalgias.  Neurological: Positive for dizziness.  All other systems reviewed and are negative.    Physical Exam Triage Vital Signs ED Triage Vitals [12/31/17 0954]  Enc Vitals Group     BP      Pulse      Resp      Temp      Temp src      SpO2      Weight      Height      Head Circumference      Peak Flow      Pain Score 4     Pain Loc      Pain Edu?      Excl. in [place]?    No data found.  Updated Vital Signs LMP 12/12/2017     Physical Exam  Constitutional: She is oriented to person, place, and time. She appears well-developed and well-nourished.  HENT:  Right Ear: External ear normal.  Left Ear: External ear normal.  Mouth/Throat: Oropharynx is clear and moist.  Eyes: Pupils are equal, round, and reactive to light. Conjunctivae are normal.  Neck: Normal range of motion. Neck supple.  Cardiovascular: Regular rhythm and normal heart sounds.  Pulmonary/Chest: Effort normal and breath sounds normal.  Abdominal: Soft. There is tenderness. There is guarding.  Exquisite right upper quadrant tenderness  Musculoskeletal: Normal range of motion.  Neurological: She is alert and oriented to person, place, and time.  Skin: Skin is warm and dry.  Nursing note and vitals reviewed.    UC Treatments / Results  Labs (all labs ordered are listed, but only abnormal results are displayed) Labs Reviewed - No data to display  EKG None  Radiology No results found.  Procedures Procedures  (including critical care time)  Medications Ordered in UC Medications - No data to display  Initial Impression / Assessment and Plan / UC Course  I have reviewed the triage vital signs and the nursing notes.  Pertinent labs & imaging results that were available during my care  of the patient were reviewed by me and considered in my medical decision making (see chart for details).    Final Clinical Impressions(s) / UC Diagnoses   Final diagnoses:  Diarrhea of infectious origin  RUQ abdominal pain  Nausea and vomiting, intractability of vomiting not specified, unspecified vomiting type     Discharge Instructions     We need to send you down to the emergency room because of the abdominal pain and dehydration with muscle cramps.  They are better able to discern the cause of this problem and treated properly.    ED Prescriptions    None     Controlled Substance Prescriptions [place] Controlled Substance Registry consulted? Not Applicable   Robyn Haber, MD 12/31/17 1015

## 2017-12-31 NOTE — ED Provider Notes (Signed)
[facility] EMERGENCY DEPARTMENT Provider Note   CSN: [id number] Arrival date & time: 12/31/17  1041     History   Chief Complaint Chief Complaint  Patient presents with  . Abdominal pain, diarrhea, vomiting    HPI Cindy Turner is a 32 y.o. female.  Patient is a 32 year old female presenting with 4 days of RUQ, NBNB emesis, and pale diarrhea.    Patient was working at a daycare with sudden onset RUQ pain with a throbbing sensation radiating to her right back with associated nausea.  She then developed nonbloody nonbilious vomiting with 4 episodes over the course of 24 hours.  This was followed by nonbloody diarrhea with pale appearance.  She continues to have diarrhea over the last 4 days.  Patient reports a history of fever 101F initially during the onset but resolved after 24 hours.  She has a decreased appetite and tried eating soup yesterday but eating and drinking seem to trigger her RUQ pain.  Patient states she has a prior history of similar pain and was told she "had a swollen gallbladder."  Patient has a history of an appendectomy and tubal, otherwise no other abdominal or pelvic surgeries.  She is a non-smoker and denies alcohol or illicit drug use.  She denies associated symptoms including shortness of breath or chest pain, syncope, fatigue, unexplained weight loss.  Patient's son had a GI bug last week.     Past Medical History:  Diagnosis Date  . Allergy   . Fibromyalgia   . Migraines   . Raynaud's disease   . Ureterolithiasis     Patient Active Problem List   Diagnosis Date Noted  . Genetic testing 10/26/2016  . Nausea and vomiting 10/19/2016  . URI, acute 10/19/2016  . Right otitis media 10/19/2016  . Tremor 07/16/2016  . Syncope and collapse 07/16/2016  . Head injury 07/16/2016  . Rib contusion, right, sequela 07/09/2016  . Chronic migraine 04/22/2016  . Chronic pelvic pain in female 01/17/2016  . Endometriosis of pelvic peritoneum  01/17/2016  . Hx of tubal ligation 01/17/2016  . Vaginitis 01/17/2016  . Dysuria 01/16/2016  . Acute bilateral low back pain 01/16/2016  . Painless hematuria 01/16/2016  . Irregular periods 01/16/2016  . Right shoulder injury- ortho 12/25/2015  . Family history of diabetes mellitus 12/25/2015  . OCD (obsessive compulsive disorder) 12/25/2015  . Adjustment disorder with mixed anxiety and depressed mood 12/25/2015  . Family history of breast cancer in first degree relative- mother age 7 12/25/2015  . Family history of uterine cancer- Mom age 22 12/25/2015  . Family history of malignant melanoma of skin- in 30's 12/25/2015  . Common migraine without intractability 11/25/2015  . Occipital neuralgia of right side 11/25/2015  . Neck pain 11/25/2015  . POTS (postural orthostatic tachycardia syndrome) 11/25/2015  . Lightheadedness 11/25/2015  . Insomnia 11/25/2015  . Periodic limb movement disorder (PLMD) 11/25/2015  . Right wrist injury and R shoulder- injured at work- ortho 09/11/2015  . 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 Fibromyalgia 05/16/2011  . Abdominal pain, acute, right upper quadrant 09/01/2010    Past Surgical History:  Procedure Laterality Date  . APPENDECTOMY    . BREAST BIOPSY    . DILATION AND CURETTAGE OF UTERUS    . kidney stent    . LAPAROSCOPY    . TONSILLECTOMY    . TUBAL LIGATION    . TUBAL LIGATION       OB History    [place]  3   Para  2   Term  2   Preterm      AB  1   Living  3     SAB  1   TAB      Ectopic      Multiple      Live Births               Home Medications    Prior to Admission medications   Medication Sig Start Date End Date Taking? Authorizing Provider  loperamide (IMODIUM) 2 MG capsule Take 1 capsule (2 mg total) by mouth 4 (four) times daily as needed for diarrhea or loose stools. 05/03/17   Horton, Barbette Hair, MD    Family History Family History  Problem Relation Age of Onset  . Hypertension Mother   .  Arthritis Mother   . Breast cancer Mother 41  . Uterine cancer Mother   . Legg-Calve-Perthes disease Father   . Healthy Brother   . Asthma Daughter   . Healthy Daughter   . Breast cancer Maternal Grandmother        dx in her 62s  . Throat cancer Maternal Grandfather        smoker; dx in 57s  . Melanoma Paternal Grandfather   . Hypertension Paternal Grandfather   . Diabetes Paternal Grandfather   . Heart attack Paternal Grandfather   . Melanoma Maternal Aunt        dx in her 53s    Social History Social History   Tobacco Use  . Smoking status: Never Smoker  . Smokeless tobacco: Never Used  Substance Use Topics  . Alcohol use: No  . Drug use: No     Allergies   Pineapple; Terbutaline sulfate; Vitamin a; and Latex   Review of Systems Review of Systems  Constitutional: Positive for appetite change, chills and fever. Negative for diaphoresis, fatigue and unexpected weight change.  HENT: Negative for congestion and sore throat.   Eyes: Negative for pain and visual disturbance.  Respiratory: Negative for cough and shortness of breath.   Cardiovascular: Negative for chest pain and palpitations.  Gastrointestinal: Positive for abdominal pain, diarrhea and nausea. Negative for abdominal distention, blood in stool, constipation and vomiting.  Genitourinary: Negative for dysuria, frequency, hematuria, pelvic pain and urgency.  Musculoskeletal: Negative for arthralgias, back pain and myalgias.  Skin: Negative for color change and rash.  Neurological: Negative for dizziness, syncope, light-headedness and headaches.  All other systems reviewed and are negative.    Physical Exam Updated Vital Signs BP 118/70 (BP Location: Left Arm)   Pulse 64   Temp 98.1 F (36.7 C) (Oral)   Resp 16   Ht 5\' 7"  (1.702 m)   Wt 61.2 kg   LMP 12/12/2017   SpO2 100%   BMI 21.14 kg/m   Physical Exam  Constitutional: She appears well-developed and well-nourished. No distress.  HENT:    Head: Normocephalic and atraumatic.  Eyes: Pupils are equal, round, and reactive to light. Conjunctivae are normal.  Neck: Normal range of motion. Neck supple.  Cardiovascular: Normal rate and regular rhythm.  No murmur heard. Pulmonary/Chest: Effort normal and breath sounds normal. No respiratory distress.  Abdominal: Soft. Bowel sounds are normal. There is no hepatosplenomegaly. There is tenderness in the right upper quadrant. There is positive Murphy's sign. There is no guarding and no tenderness at McBurney's point.  Musculoskeletal: She exhibits no edema.  Neurological: She is alert.  Skin: Skin is warm and dry.  She is not diaphoretic.  Psychiatric: She has a normal mood and affect.  Nursing note and vitals reviewed.    ED Treatments / Results  Labs (all labs ordered are listed, but only abnormal results are displayed) Labs Reviewed  CBC WITH DIFFERENTIAL/PLATELET - Abnormal; Notable for the following components:      Result Value   WBC 3.0 (*)    RBC 5.51 (*)    MCV 75.3 (*)    MCH 22.7 (*)    Neutro Abs 1.3 (*)    All other components within normal limits  URINALYSIS, ROUTINE W REFLEX MICROSCOPIC - Abnormal; Notable for the following components:   Specific Gravity, Urine >1.030 (*)    Hgb urine dipstick MODERATE (*)    All other components within normal limits  URINALYSIS, MICROSCOPIC (REFLEX) - Abnormal; Notable for the following components:   Bacteria, UA MANY (*)    All other components within normal limits  PREGNANCY, URINE  COMPREHENSIVE METABOLIC PANEL  LIPASE, BLOOD    EKG None  Radiology US Abdomen Limited Ruq  Result Date: 12/31/2017 CLINICAL DATA:  Nausea and vomiting with diarrhea. Right upper quadrant pain EXAM: ULTRASOUND ABDOMEN LIMITED RIGHT UPPER QUADRANT COMPARISON:  08/20/2016 abdominal CT FINDINGS: Gallbladder: No gallstones or wall thickening visualized. No sonographic Murphy sign noted by sonographer. Common bile duct: Diameter: 4 Liver: No  focal lesion identified. Within normal limits in parenchymal echogenicity. Portal vein is patent on color Doppler imaging with normal direction of blood flow towards the liver. IMPRESSION: Normal right upper quadrant ultrasound. Electronically Signed   By: Monte Fantasia M.D.   On: 12/31/2017 12:47    Procedures Procedures (including critical care time)  Medications Ordered in ED Medications  ondansetron (ZOFRAN-ODT) disintegrating tablet 8 mg (8 mg Oral Given 12/31/17 1216)  sodium chloride 0.9 % bolus 1,000 mL (1,000 mLs Intravenous New Bag/Given 12/31/17 1151)     Initial Impression / Assessment and Plan / ED Course  I have reviewed the triage vital signs and the nursing notes.  Pertinent labs & imaging results that were available during my care of the patient were reviewed by me and considered in my medical decision making (see chart for details).  Patient is a 32 year old female presenting with RUQ pain and vomiting and diarrhea with pale stools for 4 days.  She has a prior history of gallbladder issues in the past.  Clinical picture consistent with gallbladder disease.  Uncertain if there is a history of cholelithiasis.  Less consistent with pancreatitis or pyelonephritis.  Gastric or duodenal ulcer is in the differential.  Rule out ectopic with urine pregnancy.  Patient is without jaundice making obstruction unlikely.  She does have a history of significant volume loss without red flags.  Patient historically febrile but vital signs stable today.  Does continue to have some nausea, given Zofran.  Will fluid bolus and proceed with abdominal pain work-up including total bili levels lipase along with RUQ ultrasound.  RUQ ultrasound unremarkable.  Lipase and CMET pending due to need for send out.  Sign out provided to attending who will take over care.  Final Clinical Impressions(s) / ED Diagnoses   Final diagnoses:  RUQ pain    ED Discharge Orders    None       Cairo Bing, DO 12/31/17 1301    Margette Fast, MD 01/01/18 [phone]

## 2018-01-13 DIAGNOSIS — Z23 Encounter for immunization: Secondary | ICD-10-CM | POA: Diagnosis not present

## 2018-01-15 DIAGNOSIS — Z111 Encounter for screening for respiratory tuberculosis: Secondary | ICD-10-CM | POA: Diagnosis not present

## 2018-01-17 IMAGING — MR MR THORACIC SPINE W/O CM
6 of 16 series · 17 of 48 positions shown · non-contrast
Comparison: None.

CLINICAL DATA: History of fibromyalgia who presents to the
Emergency Department complaining of gradually worsening bilateral
lower back pain that radiates up her back towards her neck with
onset yesterday evening. Painful range of motion of the neck.
Generalized headache.

EXAM:
MRI CERVICAL, THORACIC AND LUMBAR SPINE WITHOUT CONTRAST
TECHNIQUE: Multiplanar and multiecho pulse sequences of the cervical spine, to
include the craniocervical junction and cervicothoracic junction,
and thoracic and lumbar spine, were obtained without intravenous
contrast.

[Series 3: T2 · sagittal · 3.0mm · 0.43mm/px · 1 of 12 slices shown (1 of 6)]
[im 1/12]
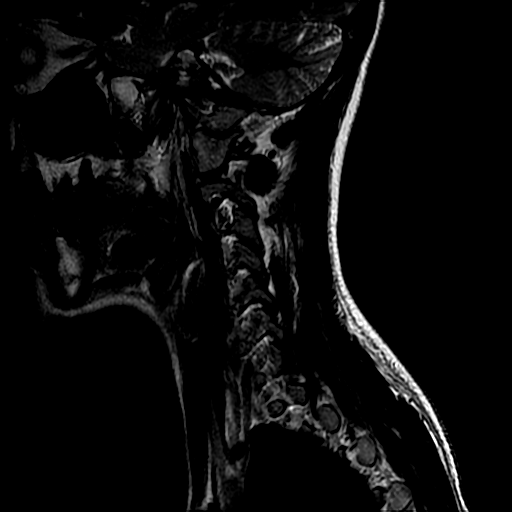

[Series 7: T2 · axial · 3.0mm · 0.39mm/px · z∈[-64,+25]mm · 4 of 28 slices shown (2 of 6)]
[im 1/28]
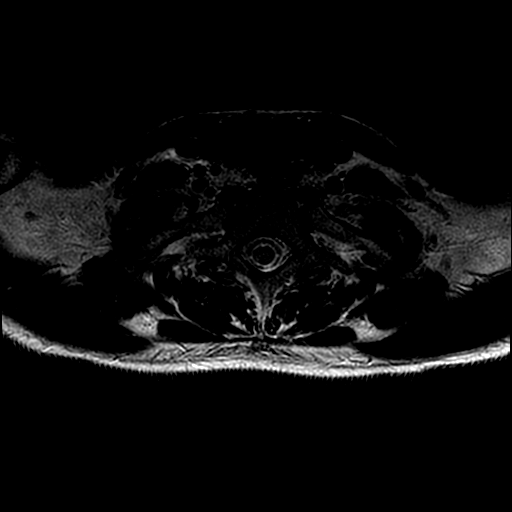
[im 10/28]
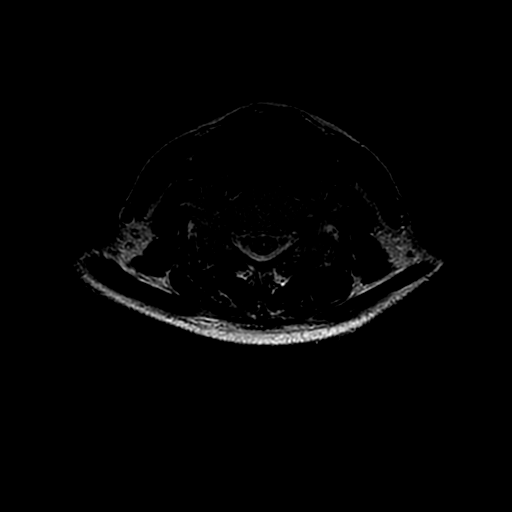
[im 19/28]
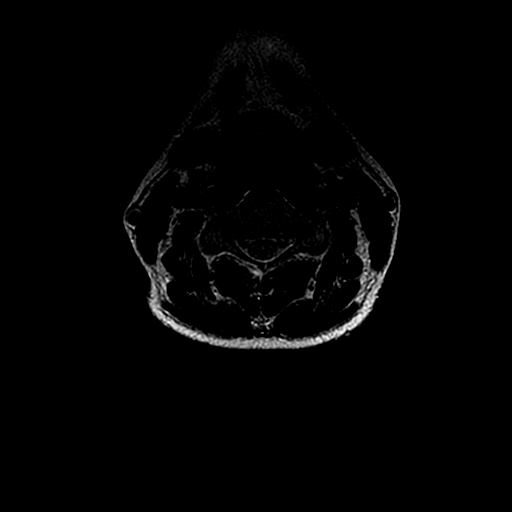
[im 28/28]
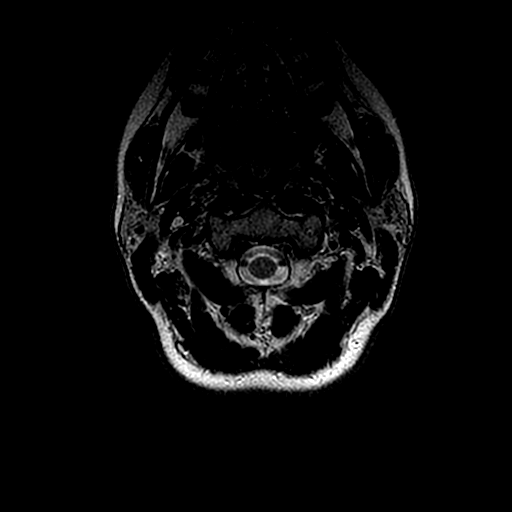

[Series 11: T2 · sagittal · 3.0mm · 0.62mm/px · 2 of 12 slices shown (3 of 6)]
[im 1/12]
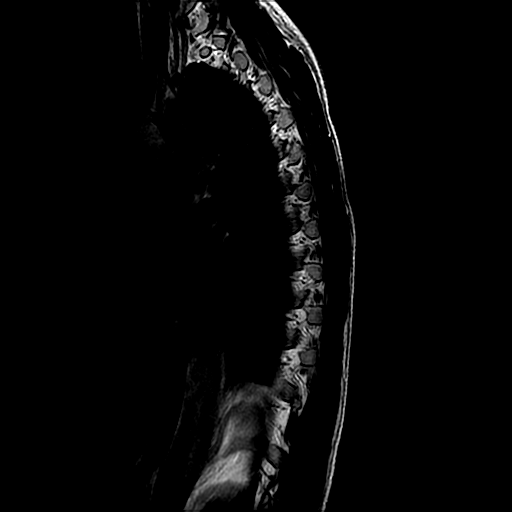
[im 12/12]
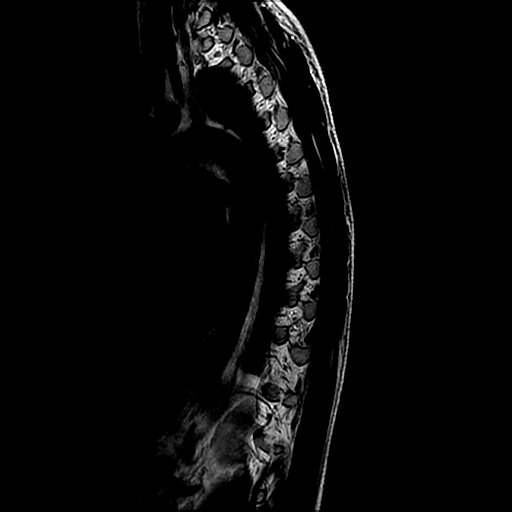

[Series 14: T2 · axial · 4.0mm · 0.43mm/px · z∈[-276,-65]mm · 6 of 39 slices shown (4 of 6)]
[im 1/39]
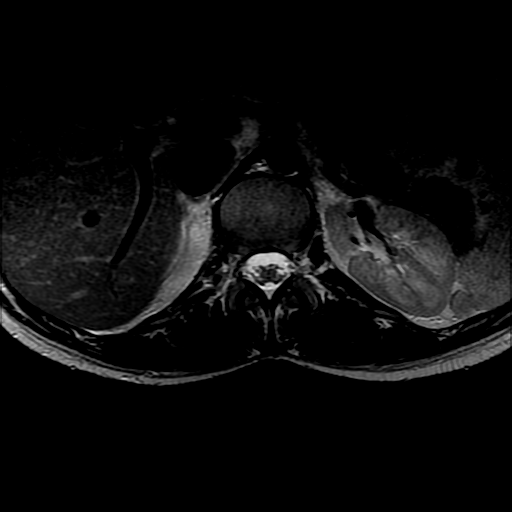
[im 8/39]
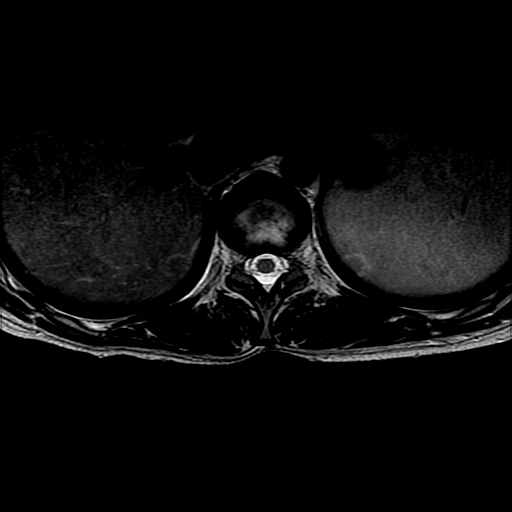
[im 16/39]
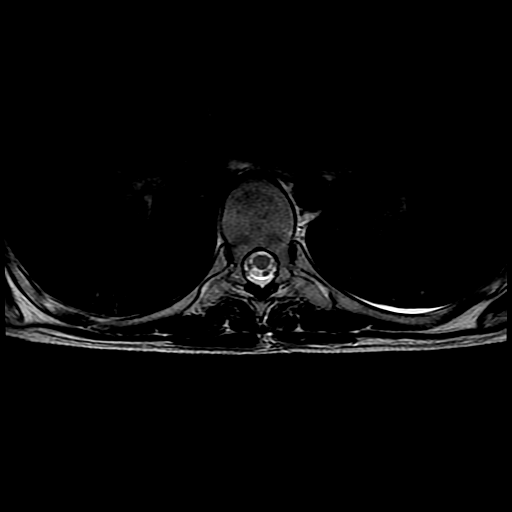
[im 23/39]
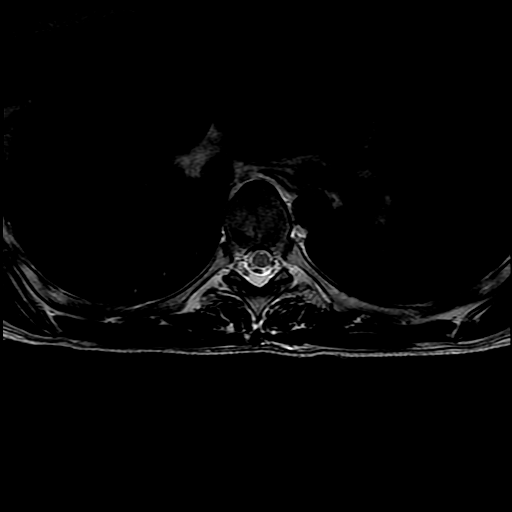
[im 31/39]
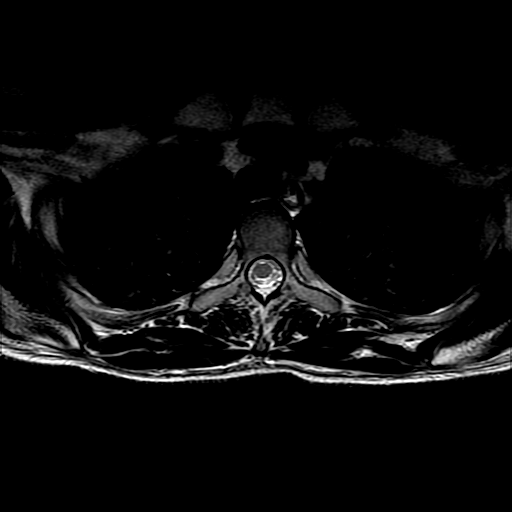
[im 39/39]
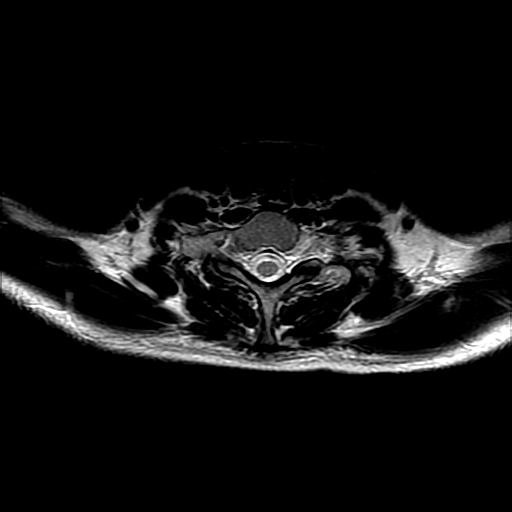

[Series 20: T2 · sagittal · 4.0mm · 0.55mm/px · 2 of 12 slices shown (5 of 6)]
[im 1/12]
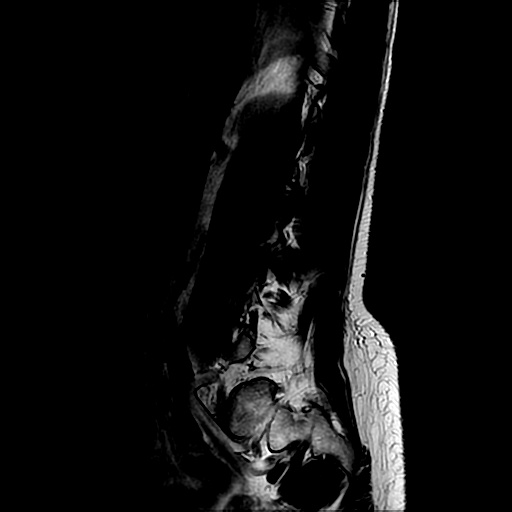
[im 12/12]
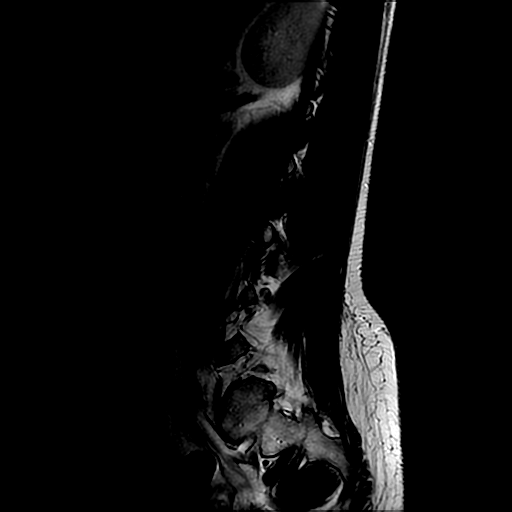

[Series 23: T2 · axial · 4.0mm · 0.39mm/px · z∈[-475,-440]mm · 2 of 32 slices shown (6 of 6)]
[im 1/32]
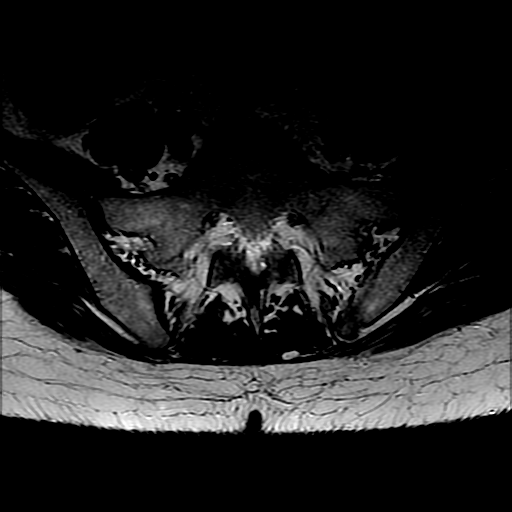
[im 8/32]
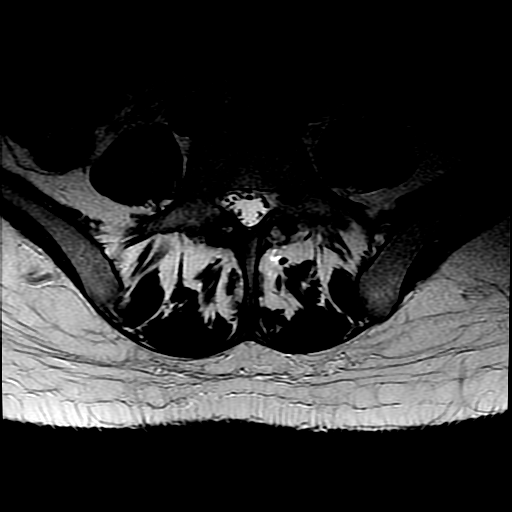

[17 of 48 positions shown; findings below may reference images not displayed]

FINDINGS: MRI CERVICAL SPINE FINDINGS

Alignment: Physiologic.

Vertebrae: No fracture, evidence of discitis, or bone lesion.

Cord: Normal signal and morphology.

Posterior Fossa, vertebral arteries, paraspinal tissues: Negative.

Disc levels:

Discs: Disc spaces are preserved.

C2-3: No significant disc bulge. No neural foraminal stenosis. No
central canal stenosis.

C3-4: No significant disc bulge. No neural foraminal stenosis. No
central canal stenosis.

C4-5: Minimal broad-based disc bulge without impression on the
thecal sac. No neural foraminal stenosis. No central canal stenosis.

C5-6: No significant disc bulge. No neural foraminal stenosis. No
central canal stenosis.

C6-7: No significant disc bulge. No neural foraminal stenosis. No
central canal stenosis.

C7-T1: No significant disc bulge. No neural foraminal stenosis. No
central canal stenosis.

MRI THORACIC SPINE FINDINGS

Alignment:  Physiologic.

Vertebrae: No fracture, evidence of discitis, or bone lesion.

Cord:  Normal signal and morphology.

Paraspinal and other soft tissues: Negative.

Disc levels:

Disc spaces:  Disc spaces are maintained.

T1-T2: No disc protrusion, foraminal stenosis or central canal
stenosis.

T2-T3: No disc protrusion, foraminal stenosis or central canal
stenosis.

T3-T4: No disc protrusion, foraminal stenosis or central canal
stenosis.

T4-T5: No disc protrusion, foraminal stenosis or central canal
stenosis.

T5-T6: No disc protrusion, foraminal stenosis or central canal
stenosis.

T6-T7: No disc protrusion, foraminal stenosis or central canal
stenosis.

T7-T8: No disc protrusion, foraminal stenosis or central canal
stenosis.

T8-T9: No disc protrusion, foraminal stenosis or central canal
stenosis.

T9-T10: No disc protrusion, foraminal stenosis or central canal
stenosis.

T10-T11: No disc protrusion, foraminal stenosis or central canal
stenosis.

T11-T12: No disc protrusion, foraminal stenosis or central canal
stenosis.

MRI LUMBAR SPINE FINDINGS

Segmentation:  Standard.

Alignment:  Physiologic.

Vertebrae:  No fracture, evidence of discitis, or bone lesion.

Conus medullaris: Extends to the L1 level and appears normal.

Paraspinal and other soft tissues: No paraspinal abnormality.

Disc levels:

Disc spaces: Disc spaces are preserved.

T12-L1: No significant disc protrusion. No evidence of neural
foraminal stenosis. No central canal stenosis.

L1-L2: No significant disc protrusion. No evidence of neural
foraminal stenosis. No central canal stenosis.

L2-L3: No significant disc protrusion. No evidence of neural
foraminal stenosis. No central canal stenosis.

L3-L4: No significant disc protrusion. No evidence of neural
foraminal stenosis. No central canal stenosis.

L4-L5: No significant disc protrusion. No evidence of neural
foraminal stenosis. No central canal stenosis.

L5-S1: No significant disc protrusion. No evidence of neural
foraminal stenosis. No central canal stenosis.
IMPRESSION: CERVICAL SPINE

1. No significant disc protrusion, foraminal stenosis or central
canal stenosis of the cervical spine.
2. No acute osseous injury of the cervical spine.

THORACIC SPINE

1. No significant disc protrusion, foraminal stenosis or central
canal stenosis of the thoracic spine.
2. No acute osseous injury of the thoracic spine.

LUMBAR SPINE

1. No significant disc protrusion, foraminal stenosis or central
canal stenosis of the lumbar spine.
2. No acute osseous injury of the lumbar spine.

## 2018-03-08 DIAGNOSIS — R319 Hematuria, unspecified: Secondary | ICD-10-CM | POA: Diagnosis not present

## 2018-03-08 DIAGNOSIS — R300 Dysuria: Secondary | ICD-10-CM | POA: Diagnosis not present

## 2018-03-08 DIAGNOSIS — J111 Influenza due to unidentified influenza virus with other respiratory manifestations: Secondary | ICD-10-CM | POA: Diagnosis not present

## 2018-03-08 DIAGNOSIS — N390 Urinary tract infection, site not specified: Secondary | ICD-10-CM | POA: Diagnosis not present

## 2018-03-08 DIAGNOSIS — R110 Nausea: Secondary | ICD-10-CM | POA: Diagnosis not present

## 2018-04-20 DIAGNOSIS — J019 Acute sinusitis, unspecified: Secondary | ICD-10-CM | POA: Diagnosis not present

## 2018-04-20 DIAGNOSIS — R05 Cough: Secondary | ICD-10-CM | POA: Diagnosis not present

## 2018-06-08 ENCOUNTER — Ambulatory Visit (HOSPITAL_COMMUNITY)
Admission: EM | Admit: 2018-06-08 | Discharge: 2018-06-08 | Disposition: A | Discharge status: Home / Self Care | Payer: 59 | Attending: Family Medicine | Admitting: Family Medicine

## 2018-06-08 ENCOUNTER — Ambulatory Visit (INDEPENDENT_AMBULATORY_CARE_PROVIDER_SITE_OTHER): Payer: 59

## 2018-06-08 ENCOUNTER — Encounter (HOSPITAL_COMMUNITY): Payer: Self-pay

## 2018-06-08 ENCOUNTER — Other Ambulatory Visit: Payer: Self-pay

## 2018-06-08 DIAGNOSIS — S9032XA Contusion of left foot, initial encounter: Secondary | ICD-10-CM

## 2018-06-08 DIAGNOSIS — S99922A Unspecified injury of left foot, initial encounter: Secondary | ICD-10-CM

## 2018-06-08 MED ORDER — TRAMADOL HCL 50 MG PO TABS: 50.0000 mg | ORAL_TABLET | Freq: Two times a day (BID) | ORAL | 0 refills | Status: DC | PRN

## 2018-06-08 NOTE — ED Provider Notes (Signed)
[place]   [id number] 06/08/18 Arrival Time: 1946  CC: Left foot PAIN  SUBJECTIVE: History from: patient. Cindy Turner is a 33 y.o. female hx significant for fibromyalgia, and Raynauds disease, complains of left foot that began 2 hours ago.  Symptoms began after 200 lbs dog stepped on her left foot.  Localizes the pain to the top of foot.  Describes the pain as constant and 7/10.  Has tried elevation, ibuprofen, and icing with minimal relief.  Symptoms are made worse with weight bearing.  Denies similar symptoms in the past.  Denies fever, chills, erythema, ecchymosis, effusion, weakness, numbness and tingling.      ROS: As per HPI.  Past Medical History:  Diagnosis Date  . Allergy   . Fibromyalgia   . Migraines   . Raynaud's disease   . Ureterolithiasis    Past Surgical History:  Procedure Laterality Date  . APPENDECTOMY    . BREAST BIOPSY    . DILATION AND CURETTAGE OF UTERUS    . kidney stent    . LAPAROSCOPY    . TONSILLECTOMY    . TUBAL LIGATION    . TUBAL LIGATION     Allergies  Allergen Reactions  . Pineapple Anaphylaxis, Itching and Rash  . Terbutaline Sulfate Other (See Comments)    Arrhythmia  . Vitamin A Other (See Comments)    Headaches   . Latex Rash   Current Facility-Administered Medications on File Prior to Encounter  Medication Dose Route Frequency Provider Last Rate Last Dose  . gadopentetate dimeglumine (MAGNEVIST) injection 12 mL  12 mL Intravenous Once PRN Sater, Nanine Means, MD       No current outpatient medications on file prior to encounter.   Social History   Socioeconomic History  . Marital status: Married    Spouse name: Not on file  . Number of children: Not on file  . Years of education: Not on file  . Highest education level: Not on file  Occupational History  . Not on file  Social Needs  . Financial resource strain: Not on file  . Food insecurity:    Worry: Not on file    Inability: Not on file  .  Transportation needs:    Medical: Not on file    Non-medical: Not on file  Tobacco Use  . Smoking status: Never Smoker  . Smokeless tobacco: Never Used  Substance and Sexual Activity  . Alcohol use: No  . Drug use: No  . Sexual activity: Yes    Partners: Male    Birth control/protection: Surgical    Comment: tubal ligation  Lifestyle  . Physical activity:    Days per week: Not on file    Minutes per session: Not on file  . Stress: Not on file  Relationships  . Social connections:    Talks on phone: Not on file    Gets together: Not on file    Attends religious service: Not on file    Active member of club or organization: Not on file    Attends meetings of clubs or organizations: Not on file    Relationship status: Not on file  . Intimate partner violence:    Fear of current or ex partner: Not on file    Emotionally abused: Not on file    Physically abused: Not on file    Forced sexual activity: Not on file  Other Topics Concern  . Not on file  Social History Narrative  .  Not on file   Family History  Problem Relation Age of Onset  . Hypertension Mother   . Arthritis Mother   . Breast cancer Mother 27  . Uterine cancer Mother   . Legg-Calve-Perthes disease Father   . Healthy Brother   . Asthma Daughter   . Healthy Daughter   . Breast cancer Maternal Grandmother        dx in her 32s  . Throat cancer Maternal Grandfather        smoker; dx in 53s  . Melanoma Paternal Grandfather   . Hypertension Paternal Grandfather   . Diabetes Paternal Grandfather   . Heart attack Paternal Grandfather   . Melanoma Maternal Aunt        dx in her 67s    OBJECTIVE:  Vitals:   06/08/18 1954 06/08/18 1956  BP: 121/82   Pulse: 70   Resp: 16   Temp: 98.2 F (36.8 C)   TempSrc: Oral   SpO2: 100%   Weight:  138 lb (62.6 kg)    General appearance: Alert; in no acute distress.  Head: NCAT Lungs: Normal respiratory effort CV: Radial pulse 2+; cap refill <2 seconds  Musculoskeletal: Left foot pain Inspection: Skin warm, dry, clear and intact without obvious erythema, effusion, or ecchymosis.  Palpation: TTP over 2nd and 3rd distal MTs Strength: deferred Skin: warm and dry Neurologic: Sitting in wheelchair; Sensation intact about the lower extremities Psychological: alert and cooperative; normal mood and affect  DIAGNOSTIC STUDIES:  Dg Foot Complete Left  Result Date: 06/08/2018 CLINICAL DATA:  Large dog stepped on foot today. Left foot pain and inability to bear weight. Initial encounter. EXAM: LEFT FOOT - COMPLETE 3+ VIEW COMPARISON:  None. FINDINGS: There is no evidence of fracture or dislocation. There is no evidence of arthropathy or other focal bone abnormality. Soft tissues are unremarkable. IMPRESSION: Negative. Electronically Signed   By: Earle Gell M.D.   On: 06/08/2018 20:24     ASSESSMENT & PLAN:  1. Injury of left foot, initial encounter   2. Contusion of left foot, initial encounter    Meds ordered this encounter  Medications  . traMADol (ULTRAM) 50 MG tablet    Sig: Take 1 tablet (50 mg total) by mouth every 12 (twelve) hours as needed.    Dispense:  10 tablet    Refill:  0    Order Specific Question:   Supervising Provider    Answer:   Raylene Everts [[id number]]   X-rays did not show fracture or dislocation Crutches and ace bandage applied Continue conservative management of rest, ice, and elevation Take ibuprofen as needed for pain relief (may cause abdominal discomfort, ulcers, and GI bleeds avoid taking with other NSAIDs) Tramadol as needed for severe break-through pain.  DO NOT TAKE while driving or operating heavy machinery Follow up with orthopedist for further evaluation and managment Return or go to the ER if you have any new or worsening symptoms (fever, chills, chest pain, abdominal pain, worsening pain, redness, swelling, symptoms do not improve with medications, etc...)    Reviewed expectations re: course of  current medical issues. Questions answered. Outlined signs and symptoms indicating need for more acute intervention. Patient verbalized understanding. After Visit Summary given.    Lestine Box, PA-C 06/08/18 2041

## 2018-06-08 NOTE — Discharge Instructions (Addendum)
X-rays did not show fracture or dislocation Crutches and ace bandage applied Continue conservative management of rest, ice, and elevation Take ibuprofen as needed for pain relief (may cause abdominal discomfort, ulcers, and GI bleeds avoid taking with other NSAIDs) Tramadol as needed for severe break-through pain.  DO NOT TAKE while driving or operating heavy machinery Follow up with orthopedist for further evaluation and managment Return or go to the ER if you have any new or worsening symptoms (fever, chills, chest pain, abdominal pain, worsening pain, redness, swelling, symptoms do not improve with medications, etc...)

## 2018-06-08 NOTE — ED Triage Notes (Signed)
Pt cc left foot pain. The dog stepped on her foot. The dog is over 200 lbs. This happened today 1 hour ago.

## 2018-09-16 ENCOUNTER — Telehealth: Payer: 59 | Admitting: Emergency Medicine

## 2018-09-16 DIAGNOSIS — R197 Diarrhea, unspecified: Secondary | ICD-10-CM | POA: Diagnosis not present

## 2018-09-16 NOTE — Progress Notes (Signed)
We are sorry that you are not feeling well.  Here is how we plan to help!  Based on what you have shared with me it looks like you have Acute Infectious Diarrhea.  Most cases of acute diarrhea are due to infections with virus and bacteria and are self-limited conditions lasting less than 14 days.  For your symptoms you may take Imodium 2 mg tablets that are over the counter at your local pharmacy. Take two tablet now and then one after each loose stool up to 6 a day.  Antibiotics are not needed for most people with diarrhea.   * if this doe snot resolve by Monday - you will need to see your PCP  HOME CARE  We recommend changing your diet to help with your symptoms for the next few days.  Drink plenty of fluids that contain water salt and sugar. Sports drinks such as Gatorade may help.   You may try broths, soups, bananas, applesauce, soft breads, mashed potatoes or crackers.   You are considered infectious for as long as the diarrhea continues. Hand washing or use of alcohol based hand sanitizers is recommend.  It is best to stay out of work or school until your symptoms stop.   GET HELP RIGHT AWAY  If you have dark yellow colored urine or do not pass urine frequently you should drink more fluids.    If your symptoms worsen   If you feel like you are going to pass out (faint)  You have a new problem  MAKE SURE YOU   Understand these instructions.  Will watch your condition.  Will get help right away if you are not doing well or get worse.  Your e-visit answers were reviewed by a board certified advanced clinical practitioner to complete your personal care plan.  Depending on the condition, your plan could have included both over the counter or prescription medications.  If there is a problem please reply  once you have received a response from your provider.  Your safety is important to [place].  If you have drug allergies check your prescription carefully.    You can use  MyChart to ask questions about today's visit, request a non-urgent call back, or ask for a work or school excuse for 24 hours related to this e-Visit. If it has been greater than 24 hours you will need to follow up with your provider, or enter a new e-Visit to address those concerns.   You will get an e-mail in the next two days asking about your experience.  I hope that your e-visit has been valuable and will speed your recovery. Thank you for using e-visits.  5-10 minutes spent reviewing and documenting in chart.

## 2019-05-30 ENCOUNTER — Other Ambulatory Visit: Payer: Self-pay | Admitting: Internal Medicine

## 2019-05-30 DIAGNOSIS — Z1231 Encounter for screening mammogram for malignant neoplasm of breast: Secondary | ICD-10-CM

## 2019-05-31 ENCOUNTER — Ambulatory Visit
Admission: RE | Admit: 2019-05-31 | Discharge: 2019-05-31 | Disposition: A | Discharge status: Home / Self Care | Payer: 59 | Source: Ambulatory Visit | Attending: Internal Medicine | Admitting: Internal Medicine

## 2019-05-31 ENCOUNTER — Other Ambulatory Visit: Payer: Self-pay

## 2019-05-31 DIAGNOSIS — Z1231 Encounter for screening mammogram for malignant neoplasm of breast: Secondary | ICD-10-CM

## 2020-06-06 ENCOUNTER — Telehealth: Payer: Self-pay | Admitting: Physician Assistant

## 2020-06-06 NOTE — Telephone Encounter (Signed)
Patient is returning [employer]'s(CMA)  call and wants [place] to know a cervical screening is not necessary as she had one done in July. Patient states she is no longer a patient here and wanted [place] to know, thanks.

## 2020-07-01 ENCOUNTER — Other Ambulatory Visit: Payer: Self-pay | Admitting: Obstetrics & Gynecology

## 2020-07-01 DIAGNOSIS — Z1231 Encounter for screening mammogram for malignant neoplasm of breast: Secondary | ICD-10-CM

## 2020-07-15 ENCOUNTER — Emergency Department: Payer: 59

## 2020-07-15 ENCOUNTER — Emergency Department (HOSPITAL_BASED_OUTPATIENT_CLINIC_OR_DEPARTMENT_OTHER): Payer: 59

## 2020-07-15 ENCOUNTER — Emergency Department (HOSPITAL_BASED_OUTPATIENT_CLINIC_OR_DEPARTMENT_OTHER)
Admission: EM | Admit: 2020-07-15 | Discharge: 2020-07-16 | Disposition: A | Discharge status: Home / Self Care | Payer: 59 | Attending: Emergency Medicine | Admitting: Emergency Medicine

## 2020-07-15 ENCOUNTER — Encounter (HOSPITAL_BASED_OUTPATIENT_CLINIC_OR_DEPARTMENT_OTHER): Payer: Self-pay

## 2020-07-15 ENCOUNTER — Other Ambulatory Visit: Payer: Self-pay

## 2020-07-15 DIAGNOSIS — W208XXA Other cause of strike by thrown, projected or falling object, initial encounter: Secondary | ICD-10-CM | POA: Insufficient documentation

## 2020-07-15 DIAGNOSIS — S0181XA Laceration without foreign body of other part of head, initial encounter: Secondary | ICD-10-CM | POA: Insufficient documentation

## 2020-07-15 DIAGNOSIS — Y9239 Other specified sports and athletic area as the place of occurrence of the external cause: Secondary | ICD-10-CM | POA: Insufficient documentation

## 2020-07-15 DIAGNOSIS — Z23 Encounter for immunization: Secondary | ICD-10-CM | POA: Diagnosis not present

## 2020-07-15 DIAGNOSIS — S0993XA Unspecified injury of face, initial encounter: Secondary | ICD-10-CM

## 2020-07-15 DIAGNOSIS — R6884 Jaw pain: Secondary | ICD-10-CM | POA: Insufficient documentation

## 2020-07-15 DIAGNOSIS — M542 Cervicalgia: Secondary | ICD-10-CM | POA: Diagnosis not present

## 2020-07-15 DIAGNOSIS — M79621 Pain in right upper arm: Secondary | ICD-10-CM | POA: Insufficient documentation

## 2020-07-15 MED ORDER — TETANUS-DIPHTH-ACELL PERTUSSIS 5-2.5-18.5 LF-MCG/0.5 IM SUSY
0.5000 mL | PREFILLED_SYRINGE | Freq: Once | INTRAMUSCULAR | Status: AC
  Administered 2020-07-15: 0.5 mL via INTRAMUSCULAR
  Filled 2020-07-15: qty 0.5

## 2020-07-15 MED ORDER — OXYCODONE-ACETAMINOPHEN 5-325 MG PO TABS
1.0000 | ORAL_TABLET | Freq: Once | ORAL | Status: AC
  Administered 2020-07-15: 1 via ORAL
  Filled 2020-07-15: qty 1

## 2020-07-15 MED ORDER — LIDOCAINE HCL (PF) 1 % IJ SOLN
10.0000 mL | Freq: Once | INTRAMUSCULAR | Status: AC
  Administered 2020-07-15: 10 mL
  Filled 2020-07-15: qty 10

## 2020-07-15 NOTE — ED Provider Notes (Signed)
[facility] Provider Note   CSN: [id number] Arrival date & time: 07/15/20  1846     History No chief complaint on file.   Cindy Turner is a 35 y.o. female with a history of fibromyalgia, migraines.  Patient presents emerged department with a chief complaint of pain to her jaw.  Patient reports that pain started after suffering an injury while working out.  Injury occurred at approximately 1800.  Patient was at the gym on a declined benchpress when the 45 pound bar struck the right side of her face.  Patient reports that it felt like her jaw was locked out of place.  Patient has had constant pain since then.  Patient rates her pain 10/10 the pain scale.  Pain is worse with moving her jaw.  Pain is improved at rest.  Patient reports that she started to feel dizzy when her pain gets worse. Patient endorses trismus.    She also complains of pain to right upper arm.  Patient reports that he bar also struck her here.  Patient endorses swelling to right upper arm.  Patient reports the pain is worse to touch.  Patient denies any alleviating factors.    Patient complains of pain to the right side of her neck.  Patient reports pain has been present since injury.  Patient rates pain 6/10 on the pain scale.  Pain is worse with touch or movement of her neck.  Patient denies any alleviating factors.   Patient denies any loss of consciousness, numbness, weakness, facial asymmetry, chest pain, shortness of breath, saddle anesthesia, visual disturbance.    Patient is unsure when her last tetanus shot was.  HPI     Past Medical History:  Diagnosis Date  . Allergy   . Fibromyalgia   . Migraines   . Raynaud's disease   . Ureterolithiasis     Patient Active Problem List   Diagnosis Date Noted  . History of nephrolithiasis 11/29/2017  . Vaginal candidiasis 11/29/2017  . RLQ abdominal pain 09/21/2017  . Pap smear abnormality of cervix with ASCUS favoring benign  09/07/2017  . Genetic testing 10/26/2016  . Nausea and vomiting 10/19/2016  . URI, acute 10/19/2016  . Right otitis media 10/19/2016  . Tremor 07/16/2016  . Syncope and collapse 07/16/2016  . Head injury 07/16/2016  . Rib contusion, right, sequela 07/09/2016  . Chronic migraine 04/22/2016  . Chronic pelvic pain in female 01/17/2016  . Endometriosis of pelvic peritoneum 01/17/2016  . Hx of tubal ligation 01/17/2016  . Vaginitis 01/17/2016  . Dysuria 01/16/2016  . Acute bilateral low back pain 01/16/2016  . Painless hematuria 01/16/2016  . Irregular periods 01/16/2016  . Right shoulder injury- ortho 12/25/2015  . Family history of diabetes mellitus 12/25/2015  . OCD (obsessive compulsive disorder) 12/25/2015  . Adjustment disorder with mixed anxiety and depressed mood 12/25/2015  . Family history of breast cancer in first degree relative- mother age 98 12/25/2015  . Family history of uterine cancer- Mom age 71 12/25/2015  . Family history of malignant melanoma of skin- in 30's 12/25/2015  . Common migraine without intractability 11/25/2015  . Occipital neuralgia of right side 11/25/2015  . Neck pain 11/25/2015  . POTS (postural orthostatic tachycardia syndrome) 11/25/2015  . Lightheadedness 11/25/2015  . Insomnia 11/25/2015  . Periodic limb movement disorder (PLMD) 11/25/2015  . Right wrist injury and R shoulder- injured at work- ortho 09/11/2015  . 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 Fibromyalgia 05/16/2011  .  Abdominal pain, acute, right upper quadrant 09/01/2010    Past Surgical History:  Procedure Laterality Date  . APPENDECTOMY    . BREAST BIOPSY    . DILATION AND CURETTAGE OF UTERUS    . kidney stent    . LAPAROSCOPY    . TONSILLECTOMY    . TUBAL LIGATION    . TUBAL LIGATION       OB History    Gravida  3   Para  2   Term  2   Preterm      AB  1   Living  3     SAB  1   IAB      Ectopic      Multiple      Live Births               Family History  Problem Relation Age of Onset  . Hypertension Mother   . Arthritis Mother   . Breast cancer Mother 57  . Uterine cancer Mother   . Legg-Calve-Perthes disease Father   . Healthy Brother   . Asthma Daughter   . Healthy Daughter   . Breast cancer Maternal Grandmother        dx in her 41s  . Throat cancer Maternal Grandfather        smoker; dx in 61s  . Melanoma Paternal Grandfather   . Hypertension Paternal Grandfather   . Diabetes Paternal Grandfather   . Heart attack Paternal Grandfather   . Melanoma Maternal Aunt        dx in her 31s    Social History   Tobacco Use  . Smoking status: Never Smoker  . Smokeless tobacco: Never Used  Vaping Use  . Vaping Use: Never used  Substance Use Topics  . Alcohol use: No  . Drug use: No    Home Medications Prior to Admission medications   Medication Sig Start Date End Date Taking? Authorizing Provider  traMADol (ULTRAM) 50 MG tablet Take 1 tablet (50 mg total) by mouth every 12 (twelve) hours as needed. 06/08/18   [place], [place], PA-C    Allergies    Pineapple, Terbutaline sulfate, Vitamin a, and Latex  Review of Systems   Review of Systems  Constitutional: Negative for chills and fever.  HENT: Positive for facial swelling.   Eyes: Negative for visual disturbance.  Respiratory: Negative for shortness of breath.   Cardiovascular: Negative for chest pain.  Gastrointestinal: Negative for abdominal pain, nausea and vomiting.  Musculoskeletal: Positive for neck pain. Negative for back pain.  Skin: Negative for color change and rash.  Neurological: Positive for dizziness. Negative for tremors, seizures, syncope, facial asymmetry, speech difficulty, weakness, light-headedness, numbness and headaches.  Psychiatric/Behavioral: Negative for confusion.    Physical Exam Updated Vital Signs BP 126/78 (BP Location: Right Arm)   Pulse 66   Resp 18   Ht 5\' 7"  (1.702 m)   Wt 70.3 kg   LMP 07/13/2020   SpO2 100%    BMI 24.28 kg/m   Physical Exam Vitals and nursing note reviewed.  Constitutional:      General: She is not in acute distress.    Appearance: She is not ill-appearing, toxic-appearing or diaphoretic.     Comments: Appears uncomfortable  HENT:     Head: Normocephalic. No raccoon eyes, Battle's sign, abrasion, contusion, masses, right periorbital erythema, left periorbital erythema or laceration.     Jaw: Trismus, tenderness and pain on movement present. No swelling or malocclusion.  Comments: Patient has linear 2 cm laceration to right chin, edges are straight, all bleeding controlled.  Abrasion noted to interior of patient's lip    Mouth/Throat:     Mouth: Mucous membranes are moist. Lacerations present.     Dentition: Dental tenderness present.  Eyes:     General: No scleral icterus.       Right eye: No discharge.        Left eye: No discharge.  Cardiovascular:     Rate and Rhythm: Normal rate.  Pulmonary:     Effort: Pulmonary effort is normal. No respiratory distress.     Breath sounds: No stridor.  Musculoskeletal:     Right shoulder: No swelling, deformity, effusion, laceration, tenderness or bony tenderness. Normal range of motion. Normal strength.     Right upper arm: Swelling and tenderness present. No edema, deformity, lacerations or bony tenderness.     Right elbow: No swelling, deformity, effusion or lacerations. Normal range of motion. No tenderness.     Right forearm: No swelling, edema, deformity, lacerations, tenderness or bony tenderness.     Right wrist: No swelling, deformity, effusion, lacerations, tenderness, bony tenderness or snuff box tenderness. Normal range of motion.     Cervical back: Tenderness present. No swelling, edema, deformity, erythema, signs of trauma, lacerations, rigidity, spasms, torticollis, bony tenderness or crepitus. Pain with movement present. Normal range of motion.     Thoracic back: No swelling, edema, deformity, signs of trauma,  lacerations, spasms, tenderness or bony tenderness. Normal range of motion.     Lumbar back: No swelling, edema, deformity, signs of trauma, lacerations, spasms, tenderness or bony tenderness. Normal range of motion.     Comments: Patient has erythema and minimal swelling to right upper arm, no bony tenderness  Tenderness to right trapezius and sternocleidomastoid muscles  Skin:    General: Skin is warm and dry.  Neurological:     General: No focal deficit present.     Mental Status: She is alert.  Psychiatric:        Behavior: Behavior is cooperative.     ED Results / Procedures / Treatments   Labs (all labs ordered are listed, but only abnormal results are displayed) Labs Reviewed - No data to display  EKG None  Radiology CT Maxillofacial Wo Contrast  Result Date: 07/15/2020 CLINICAL DATA:  Status post trauma. EXAM: CT MAXILLOFACIAL WITHOUT CONTRAST TECHNIQUE: Multidetector CT imaging of the maxillofacial structures was performed. Multiplanar CT image reconstructions were also generated. COMPARISON:  None. FINDINGS: Osseous: No fracture or mandibular dislocation. No destructive process. Orbits: Negative. No traumatic or inflammatory finding. Sinuses: Clear. Soft tissues: Negative. Limited intracranial: No significant or unexpected finding. IMPRESSION: No acute osseous abnormality. Electronically Signed   By: Virgina Norfolk M.D.   On: 07/15/2020 20:42    Procedures .Marland KitchenLaceration Repair  Date/Time: 07/16/2020 12:57 AM Performed by: Loni Beckwith, PA-C Authorized by: Loni Beckwith, PA-C   Consent:    Consent obtained:  Verbal   Consent given by:  Patient   Risks discussed:  Infection, need for additional repair, pain, poor cosmetic result and poor wound healing   Alternatives discussed:  No treatment and delayed treatment Universal protocol:    Procedure explained and questions answered to patient or proxy's satisfaction: yes     Patient identity confirmed:   Verbally with patient and arm band Anesthesia:    Anesthesia method:  Nerve block   Block needle gauge:  25 G   Block anesthetic:  Lidocaine 1% w/o epi   Block technique:  Mental nerve block   Block injection procedure:  Anatomic landmarks identified, introduced needle, incremental injection and negative aspiration for blood Laceration details:    Location:  Face   Face location:  Chin   Length (cm):  2   Depth (mm):  5 Exploration:    Wound exploration: entire depth of wound visualized     Wound extent: no foreign bodies/material noted   Treatment:    Area cleansed with:  Povidone-iodine   Amount of cleaning:  Standard Skin repair:    Repair method:  Sutures   Suture size:  6-0   Suture technique:  Simple interrupted   Number of sutures:  3 Approximation:    Approximation:  Close Repair type:    Repair type:  Simple Post-procedure details:    Procedure completion:  Tolerated well, no immediate complications     Medications Ordered in ED Medications  oxyCODONE-acetaminophen (PERCOCET/ROXICET) 5-325 MG per tablet 1 tablet (1 tablet Oral Given 07/15/20 2303)  lidocaine (PF) (XYLOCAINE) 1 % injection 10 mL (10 mLs Infiltration Given by Other 07/15/20 2308)  Tdap (BOOSTRIX) injection 0.5 mL (0.5 mLs Intramuscular Given 07/15/20 2305)  oxyCODONE-acetaminophen (PERCOCET/ROXICET) 5-325 MG per tablet 1 tablet (1 tablet Oral Given 07/16/20 0020)    ED Course  I have reviewed the triage vital signs and the nursing notes.  Pertinent labs & imaging results that were available during my care of the patient were reviewed by me and considered in my medical decision making (see chart for details).    MDM Rules/Calculators/A&P                          Alert 35 year old female no acute distress, nontoxic-appearing, appears uncomfortable due to complaints of pain.  Presents to the emerged part with a chief plaint of jaw pain after suffering an injury at the gym.  Patient had a 45 pound weight  bar landed on the right side of her face.  Patient complains of jaw pain, pain to right upper arm.  Patient endorses trismus and speaks through clenched mouth.    On physical exam patient has tenderness along entire length of jaw.  Patient has difficulty opening her mouth.  No malocclusion noted.  Patient has linear 2 cm laceration to right chin, edges are straight, all bleeding controlled.  Abrasion noted to interior of patient's lip.  No bony tenderness, deformity or decreased range of motion to right shoulder or elbow.  Contrast CT maxillofacial was obtained while patient was in triage.  Imaging showed no acute osseous abnormality.  Discussed x-ray imaging to evaluate for possible fracture or dislocation to patient's right upper arm.  Patient defers imaging at that time.    Will give patient pain management with Percocet and update tetanus shot.  Patient's laceration will require sutures.  Procedure performed as above.  Patient reports minimal improvement after receiving Percocet.  We will give patient additional Percocet medication at this time.  Prescribe patient with short course of Percocet medication, Robaxin, and lidocaine patch.  Patient to follow-up with her primary care provider if symptoms do not improve.  Patient will follow up with primary care provider in 5 days to have sutures removed.  Discussed results, findings, treatment and follow up. Patient advised of return precautions. Patient verbalized understanding and agreed with plan.    Final Clinical Impression(s) / ED Diagnoses Final diagnoses:  Jaw pain  Facial injury, initial encounter  Facial laceration, initial encounter    Rx / DC Orders ED Discharge Orders         Ordered    oxyCODONE-acetaminophen (PERCOCET/ROXICET) 5-325 MG tablet  Every 6 hours PRN        07/16/20 0025    methocarbamol (ROBAXIN) 500 MG tablet  Every 8 hours PRN        07/16/20 0025    lidocaine (LIDODERM) 5 %  Every 24 hours        07/16/20 0025            Loni Beckwith, PA-C 07/16/20 0105    Quintella Reichert, MD 07/18/20 1450

## 2020-07-15 NOTE — ED Notes (Signed)
Discussed  With L Layden PA-orders received

## 2020-07-15 NOTE — ED Notes (Signed)
Patient transported to CT 

## 2020-07-15 NOTE — ED Triage Notes (Signed)
Pt states she was at the gym ~45 min PTA-lifting 45lbs with a 20lb bar-fell on her face-pain to jaw and slight lac to chin-no bleeding at present-to triage in w/c

## 2020-07-16 MED ORDER — LIDOCAINE 5 % EX PTCH: 1.0000 | MEDICATED_PATCH | CUTANEOUS | 0 refills | Status: DC

## 2020-07-16 MED ORDER — OXYCODONE-ACETAMINOPHEN 5-325 MG PO TABS: 1.0000 | ORAL_TABLET | Freq: Four times a day (QID) | ORAL | 0 refills | Status: AC | PRN

## 2020-07-16 MED ORDER — OXYCODONE-ACETAMINOPHEN 5-325 MG PO TABS
1.0000 | ORAL_TABLET | Freq: Once | ORAL | Status: AC
  Administered 2020-07-16: 1 via ORAL
  Filled 2020-07-16: qty 1

## 2020-07-16 MED ORDER — METHOCARBAMOL 500 MG PO TABS: 500.0000 mg | ORAL_TABLET | Freq: Three times a day (TID) | ORAL | 0 refills | Status: DC | PRN

## 2020-07-16 NOTE — Discharge Instructions (Addendum)
You came to the emergency department to be evaluated after your injury.  The CT scan of your face showed no broken bones or jaw dislocation.  The laceration on your chin required stitches.    Please keep the wound dry for the next 24 hours.  After this you may gently clean the area with warm soap and water.  Please do not submerge the wound under water.  Please return to your primary care provider, urgent care or emergency department in 5 days to have your stitches removed.  Please perform salt water rinses after eating meals to help keep the abrasion on the inside of your lip clean and free of debris.  Today you received medications that may make you sleepy or impair your ability to make decisions.  For the next 24 hours please do not drive, operate heavy machinery, care for a small child with out another adult present, or perform any activities that may cause harm to you or someone else if you were to fall asleep or be impaired.   You are being prescribed a medication which may make you sleepy. Please follow up of listed precautions for at least 24 hours after taking one dose.  Get help right away if: You develop severe swelling around the wound. Your pain suddenly increases and is severe. You develop painful lumps near the wound or on skin anywhere else on your body. You have a red streak going away from your wound. The wound is on your hand or foot and you cannot properly move a finger or toe. The wound is on your hand or foot, and you notice that your fingers or toes look pale or bluish.

## 2020-08-30 ENCOUNTER — Other Ambulatory Visit: Payer: Self-pay

## 2020-08-30 ENCOUNTER — Ambulatory Visit
Admission: RE | Admit: 2020-08-30 | Discharge: 2020-08-30 | Disposition: A | Discharge status: Home / Self Care | Payer: 59 | Source: Ambulatory Visit | Attending: Obstetrics & Gynecology | Admitting: Obstetrics & Gynecology

## 2020-08-30 DIAGNOSIS — Z1231 Encounter for screening mammogram for malignant neoplasm of breast: Secondary | ICD-10-CM

## 2020-12-18 ENCOUNTER — Telehealth: Payer: Self-pay | Admitting: Genetic Counselor

## 2020-12-18 NOTE — Telephone Encounter (Signed)
Scheduled appt per 11/4 referral. Pt is aware of appt date and time.  

## 2020-12-26 ENCOUNTER — Encounter: Payer: Self-pay | Admitting: Genetic Counselor

## 2020-12-26 ENCOUNTER — Inpatient Hospital Stay: Payer: 59

## 2020-12-26 ENCOUNTER — Inpatient Hospital Stay: Payer: 59 | Attending: Genetic Counselor | Admitting: Genetic Counselor

## 2020-12-26 ENCOUNTER — Other Ambulatory Visit: Payer: Self-pay

## 2020-12-26 ENCOUNTER — Other Ambulatory Visit: Payer: Self-pay | Admitting: Genetic Counselor

## 2020-12-26 DIAGNOSIS — Z808 Family history of malignant neoplasm of other organs or systems: Secondary | ICD-10-CM | POA: Diagnosis not present

## 2020-12-26 DIAGNOSIS — Z8049 Family history of malignant neoplasm of other genital organs: Secondary | ICD-10-CM | POA: Diagnosis not present

## 2020-12-26 DIAGNOSIS — Z803 Family history of malignant neoplasm of breast: Secondary | ICD-10-CM

## 2020-12-26 LAB — GENETIC SCREENING ORDER

## 2020-12-26 NOTE — Progress Notes (Signed)
REFERRING PROVIDER: Beverely Pace, MD [place],  [place] [address]  PRIMARY PROVIDER:  Pcp, No  PRIMARY REASON FOR VISIT:  1. Family history of malignant melanoma of skin- in 55's   2. Family history of uterine cancer- Mom age 37   3. Family history of breast cancer in first degree relative- mother age 69      HISTORY OF PRESENT ILLNESS:   Cindy Turner, a 35 y.o. female, was seen for a [facility] cancer genetics consultation at the request of Dr. Adair Laundry due to a family history of breast cancer and melanoma.  Cindy Turner presents to clinic today to discuss the possibility of a hereditary predisposition to cancer, genetic testing, and to further clarify her future cancer risks, as well as potential cancer risks for family members.   Cindy Turner is a 35 y.o. female with no personal history of cancer.  She had genetic testing due to her family history of breast cancer in 2014.  At that time a VUS in NBN was identified but the remainder of testing was negative.  She is here to discuss updated genetic testing and update her family history.  CANCER HISTORY:  Oncology History   No history exists.     RISK FACTORS:  Menarche was at age 40.  First live birth at age 20.  OCP use for approximately  15  years.  Ovaries intact: yes.  Hysterectomy: no.  Menopausal status: premenopausal.  HRT use: 0 years. Colonoscopy: no; not examined. Mammogram within the last year: yes. Number of breast biopsies: 0. Up to date with pelvic exams: yes. Any excessive radiation exposure in the past: no  Past Medical History:  Diagnosis Date   Allergy    Family history of melanoma    Fibromyalgia    Migraines    Raynaud's disease    Ureterolithiasis     Past Surgical History:  Procedure Laterality Date   APPENDECTOMY     BREAST BIOPSY Left    DILATION AND CURETTAGE OF UTERUS     kidney stent     LAPAROSCOPY     TONSILLECTOMY     TUBAL LIGATION     TUBAL  LIGATION      Social History   Socioeconomic History   Marital status: Married    Spouse name: Not on file   Number of children: Not on file   Years of education: Not on file   Highest education level: Not on file  Occupational History   Not on file  Tobacco Use   Smoking status: Never   Smokeless tobacco: Never  Vaping Use   Vaping Use: Never used  Substance and Sexual Activity   Alcohol use: No   Drug use: No   Sexual activity: Not on file    Comment: tubal ligation  Other Topics Concern   Not on file  Social History Narrative   Not on file   Social Determinants of Health   Financial Resource Strain: Not on file  Food Insecurity: Not on file  Transportation Needs: Not on file  Physical Activity: Not on file  Stress: Not on file  Social Connections: Not on file     FAMILY HISTORY:  We obtained a detailed, 4-generation family history.  Significant diagnoses are listed below: Family History  Problem Relation Age of Onset   Hypertension Mother    Arthritis Mother    Breast cancer Mother 38   Uterine cancer Mother    Legg-Calve-Perthes disease  Father    Healthy Brother    Melanoma Maternal Aunt        dx in her 40s   Breast cancer Maternal Grandmother        dx in her 45s   Throat cancer Maternal Grandfather        smoker; dx in 63s   Melanoma Paternal Grandfather    Hypertension Paternal Grandfather    Diabetes Paternal Grandfather    Heart attack Paternal Grandfather    Asthma Daughter    Healthy Daughter    Lung cancer Other     The patient has two daughters who are cancer free.  She has a brother who is cancer free.  Both parents are living.  The patient's mother had breast cancer at 66-30, uterine cancer at 47 and two melanomas at 32.  She has two sisters, one who had a melanoma in her 45's.  The patient's grandmother had breast cancer in her 53's as well.  Neither her mother or grandmother are willing to undergo genetic testing.  The patient's  father is living and healthy.  He has two sisters and a brother who are cancer free.  His father had a melanoma at 24 and died at 86.  No other cancer is reported in the family.  Cindy Turner is unaware of previous family history of genetic testing for hereditary cancer risks. Patient's maternal ancestors are of [place], [place] and Native American descent, and paternal ancestors are of [place], [place] and [place] descent. There is no reported Ashkenazi Jewish ancestry. There is no known consanguinity.  GENETIC COUNSELING ASSESSMENT: Ms. Flud is a 35 y.o. female with a family history of breast cancer and melanoma which is somewhat suggestive of a hereditary cancer syndrome and predisposition to cancer given the number of cases of melanoma in the family (rule of 3's), and multiple generations of very early onset of breast cancer. We, therefore, discussed and recommended the following at today's visit.   DISCUSSION: We discussed that, in general, most cancer is not inherited in families, but instead is sporadic or familial. Sporadic cancers occur by chance and typically happen at older ages (>50 years) as this type of cancer is caused by genetic changes acquired during an individual's lifetime. Some families have more cancers than would be expected by chance; however, the ages or types of cancer are not consistent with a known genetic mutation or known genetic mutations have been ruled out. This type of familial cancer is thought to be due to a combination of multiple genetic, environmental, hormonal, and lifestyle factors. While this combination of factors likely increases the risk of cancer, the exact source of this risk is not currently identifiable or testable.  We discussed that 5 - 10% of breast cancer is hereditary, with most cases associated with BRCA mutations.  There are other genes that can be associated with hereditary breast cancer syndromes.  These include ATM, CHEK2 and PALB2.  Additionally,  about 5-10% of melanoma is hereditary, with most cases due to CDKN2A mutations. Other genes associated with hereditary melanoma syndromes include MITF, POT1 and BAP1.  We discussed that testing is beneficial for several reasons including knowing how to follow individuals and understand if other family members could be at risk for cancer and allow them to undergo genetic testing.   We discussed that some people do not want to undergo genetic testing due to fear of genetic discrimination.  A federal law called the Genetic Information Non-Discrimination Act (GINA) of 2008 helps  protect individuals against genetic discrimination based on their genetic test results.  It impacts both health insurance and employment.  With health insurance, it protects against increased premiums, being kicked off insurance or being forced to take a test in order to be insured.  For employment it protects against hiring, firing and promoting decisions based on genetic test results.  Health status due to a cancer diagnosis is not protected under GINA.   We reviewed the characteristics, features and inheritance patterns of hereditary cancer syndromes. We also discussed genetic testing, including the appropriate family members to test (affected members have declined testing), the process of testing, insurance coverage and turn-around-time for results. We reviewed that in the last 10 years, testing has improved through the identification of additional cancer causing genes, as well as using RNA to look for deep intronic variants.  We discussed the implications of a negative, positive, carrier and/or variant of uncertain significant result. We recommended Ms. Bazzle pursue genetic testing for the CancerNext-Expanded+RNAinsight gene panel.   The CancerNext-Expanded gene panel offered by [facility] and includes sequencing and rearrangement analysis for the following 77 genes: AIP, ALK, APC*, ATM*, AXIN2, BAP1, BARD1, BLM, BMPR1A,  BRCA1*, BRCA2*, BRIP1*, CDC73, CDH1*, CDK4, CDKN1B, CDKN2A, CHEK2*, CTNNA1, DICER1, FANCC, FH, FLCN, GALNT12, KIF1B, LZTR1, MAX, MEN1, MET, MLH1*, MSH2*, MSH3, MSH6*, MUTYH*, NBN, NF1*, NF2, NTHL1, PALB2*, PHOX2B, PMS2*, POT1, PRKAR1A, PTCH1, PTEN*, RAD51C*, RAD51D*, RB1, RECQL, RET, SDHA, SDHAF2, SDHB, SDHC, SDHD, SMAD4, SMARCA4, SMARCB1, SMARCE1, STK11, SUFU, TMEM127, TP53*, TSC1, TSC2, VHL and XRCC2 (sequencing and deletion/duplication); EGFR, EGLN1, HOXB13, KIT, MITF, PDGFRA, POLD1, and POLE (sequencing only); EPCAM and GREM1 (deletion/duplication only). DNA and RNA analyses performed for * genes.   Based on Ms. Madonna's family history of cancer, she meets medical criteria for genetic testing. Despite that she meets criteria, she may still have an out of pocket cost. We discussed that if her out of pocket cost for testing is over $100, the laboratory will call and confirm whether she wants to proceed with testing.  If the out of pocket cost of testing is less than $100 she will be billed by the genetic testing laboratory.   Based on the patient's family history, a statistical model (Tyrer Cusik) was used to estimate her risk of developing breast cancer. This estimates her lifetime risk of developing breast cancer to be approximately 25.5%. This estimation does not consider any genetic testing results.  The patient's lifetime breast cancer risk is a preliminary estimate based on available information using one of several models endorsed by the [place] (ACS). The ACS recommends consideration of breast MRI screening as an adjunct to mammography for patients at high risk (defined as 20% or greater lifetime risk). Please note that a woman's breast cancer risk changes over time. It may increase or decrease based on age and any changes to the personal and/or family medical history. The risks and recommendations listed above apply to this patient at this point in time. In the future, she may or  may not be eligible for the same medical management strategies and, in some cases, other medical management strategies may become available to her. If she is interested in an updated breast cancer risk assessment at a later date, she can contact us.  Ms. Dinius has been determined to be at high risk for breast cancer.  Therefore, we recommend that annual screening with mammography and breast MRI be performed.  We discussed that Ms. Janelle should discuss her individual situation with her  referring physician and determine a breast cancer screening plan with which they are both comfortable.  We can also refer her to the [facility].     PLAN: After considering the risks, benefits, and limitations, Ms. Veno provided informed consent to pursue genetic testing and the blood sample was sent to [facility] for analysis of the CancerNext-Expanded+RNAinsight. Results should be available within approximately 2-3 weeks' time, at which point they will be disclosed by telephone to Ms. Handler, as will any additional recommendations warranted by these results. Ms. Hark will receive a summary of her genetic counseling visit and a copy of her results once available. This information will also be available in Epic.   Based on Ms. Harbaugh's family history, we recommended her mother, who was diagnosed with breast cancer at age 28-30, have genetic counseling and testing. Ms. Mcmahan will let us know if we can be of any assistance in coordinating genetic counseling and/or testing for this family member. The patient reports that she has declined genetic testing at this time.  Lastly, we encouraged Ms. Bourque to remain in contact with cancer genetics annually so that we can continuously update the family history and inform her of any changes in cancer genetics and testing that may be of benefit for this family.   Ms. Ormiston questions were answered to  her satisfaction today. Our contact information was provided should additional questions or concerns arise. Thank you for the referral and allowing [place] to share in the care of your patient.   Ia Leeb P. Florene Glen, [place], [facility] Licensed, Insurance risk surveyor [email or web address] phone: [phone]  The patient was seen for a total of 40 minutes in face-to-face genetic counseling.  The patient was seen alone.  This patient was discussed with Drs. Magrinat, Lindi Adie and/or Burr Medico who agrees with the above.    _______________________________________________________________________ For Office Staff:  Number of people involved in session: 1 Was an Intern/ student involved with case: no

## 2021-01-16 ENCOUNTER — Telehealth: Payer: Self-pay | Admitting: Genetic Counselor

## 2021-01-16 NOTE — Telephone Encounter (Signed)
LM on VM that results were back and to please call. ?

## 2021-01-20 ENCOUNTER — Ambulatory Visit: Payer: Self-pay | Admitting: Genetic Counselor

## 2021-01-20 DIAGNOSIS — Z1379 Encounter for other screening for genetic and chromosomal anomalies: Secondary | ICD-10-CM

## 2021-01-20 NOTE — Telephone Encounter (Signed)
Left 2nd message on VM that results are back and to please call.

## 2021-01-20 NOTE — Progress Notes (Signed)
HPI:  Cindy Turner was previously seen in the [facility] due to a family history of breast and other cancers and concerns regarding a hereditary predisposition to cancer. Please refer to our prior cancer genetics clinic note for more information regarding our discussion, assessment and recommendations, at the time. Cindy Turner recent genetic test results were disclosed to her, as were recommendations warranted by these results. These results and recommendations are discussed in more detail below.  CANCER HISTORY:  Oncology History   No history exists.    FAMILY HISTORY:  We obtained a detailed, 4-generation family history.  Significant diagnoses are listed below: Family History  Problem Relation Age of Onset   Hypertension Mother    Arthritis Mother    Breast cancer Mother 58   Uterine cancer Mother    Legg-Calve-Perthes disease Father    Healthy Brother    Melanoma Maternal Aunt        dx in her 62s   Breast cancer Maternal Grandmother        dx in her 37s   Throat cancer Maternal Grandfather        smoker; dx in 36s   Melanoma Paternal Grandfather    Hypertension Paternal Grandfather    Diabetes Paternal Grandfather    Heart attack Paternal Grandfather    Asthma Daughter    Healthy Daughter    Lung cancer Other      The patient has two daughters who are cancer free.  She has a brother who is cancer free.  Both parents are living.   The patient's mother had breast cancer at 43-30, uterine cancer at 53 and two melanomas at 55.  She has two sisters, one who had a melanoma in her 44's.  The patient's grandmother had breast cancer in her 67's as well.  Neither her mother or grandmother are willing to undergo genetic testing.   The patient's father is living and healthy.  He has two sisters and a brother who are cancer free.  His father had a melanoma at 108 and died at 33.  No other cancer is reported in the family.   Cindy Turner is unaware of  previous family history of genetic testing for hereditary cancer risks. Patient's maternal ancestors are of [place], [place] and Native American descent, and paternal ancestors are of [place], [place] and [place] descent. There is no reported Ashkenazi Jewish ancestry. There is no known consanguinity.   GENETIC TEST RESULTS: Genetic testing reported out on January 15, 2021 through the CancerNext-Expanded+RNAinsight cancer panel found no pathogenic mutations. The CancerNext-Expanded gene panel offered by [facility] and includes sequencing and rearrangement analysis for the following 77 genes: AIP, ALK, APC*, ATM*, AXIN2, BAP1, BARD1, BLM, BMPR1A, BRCA1*, BRCA2*, BRIP1*, CDC73, CDH1*, CDK4, CDKN1B, CDKN2A, CHEK2*, CTNNA1, DICER1, FANCC, FH, FLCN, GALNT12, KIF1B, LZTR1, MAX, MEN1, MET, MLH1*, MSH2*, MSH3, MSH6*, MUTYH*, NBN, NF1*, NF2, NTHL1, PALB2*, PHOX2B, PMS2*, POT1, PRKAR1A, PTCH1, PTEN*, RAD51C*, RAD51D*, RB1, RECQL, RET, SDHA, SDHAF2, SDHB, SDHC, SDHD, SMAD4, SMARCA4, SMARCB1, SMARCE1, STK11, SUFU, TMEM127, TP53*, TSC1, TSC2, VHL and XRCC2 (sequencing and deletion/duplication); EGFR, EGLN1, HOXB13, KIT, MITF, PDGFRA, POLD1, and POLE (sequencing only); EPCAM and GREM1 (deletion/duplication only). DNA and RNA analyses performed for * genes. The test report has been scanned into EPIC and is located under the Molecular Pathology section of the Results Review tab.  A portion of the result report is included below for reference.     We discussed with Cindy Turner that because current genetic  testing is not perfect, it is possible there may be a gene mutation in one of these genes that current testing cannot detect, but that chance is small.  We also discussed, that there could be another gene that has not yet been discovered, or that we have not yet tested, that is responsible for the cancer diagnoses in the family. It is also possible there is a hereditary cause for the cancer in the family that Cindy Turner  Turner not inherit and therefore was not identified in her testing.  Therefore, it is important to remain in touch with cancer genetics in the future so that we can continue to offer Cindy Turner the most up to date genetic testing.   ADDITIONAL GENETIC TESTING: We discussed with Cindy Turner that her genetic testing was fairly extensive.  If there are genes identified to increase cancer risk that can be analyzed in the future, we would be happy to discuss and coordinate this testing at that time.    CANCER SCREENING RECOMMENDATIONS: Cindy Turner test result is considered negative (normal).  This means that we have not identified a hereditary cause for her family history of cancer at this time. Most cancers happen by chance and this negative test suggests that her cancer may fall into this category.    While reassuring, this does not definitively rule out a hereditary predisposition to cancer. It is still possible that there could be genetic mutations that are undetectable by current technology. There could be genetic mutations in genes that have not been tested or identified to increase cancer risk.  Therefore, it is recommended she continue to follow the cancer management and screening guidelines provided by her primary healthcare provider.   An individual's cancer risk and medical management are not determined by genetic test results alone. Overall cancer risk assessment incorporates additional factors, including personal medical history, family history, and any available genetic information that may result in a personalized plan for cancer prevention and surveillance  Given Cindy Turner's family history, we must interpret these negative results with some caution.  Families with features suggestive of hereditary risk for cancer tend to have multiple family members with cancer, diagnoses in multiple generations and diagnoses before the age of 51. Cindy Turner family exhibits these features. Thus,  this result may simply reflect our current inability to detect all mutations within these genes, there may be a different gene that has not yet been discovered or tested, or possibly a hereditary mutation in a person who is declining from being tested.   Cindy Turner has been determined to be at high risk for breast cancer. her Tyrer-Cuzick risk score is 25.5%.  For women with a greater than 20% lifetime risk of breast cancer, the [employer]  (NCCN) recommends the following:   1.      Clinical encounter every 6-12 months to begin when identified as being at increased risk, but not before age 60  2.      Annual mammograms. Tomosynthesis is recommended starting 10 years earlier than the youngest breast cancer diagnosis in the family or at age 52 (whichever comes first), but not before age 75    3.      Annual breast MRI starting 10 years earlier than the youngest breast cancer diagnosis in the family or at age 44 (whichever comes first), but not before age 17.   We, therefore, discussed that it is reasonable for Cindy Turner to be followed by a high-risk breast cancer clinic; in  addition to a yearly mammogram and physical exam by a healthcare provider, she should discuss the usefulness of an annual breast MRI with the high-risk clinic providers. Cindy Turner has been referred to the [facility].  RECOMMENDATIONS FOR FAMILY MEMBERS:  Individuals in this family might be at some increased risk of developing cancer, over the general population risk, simply due to the family history of cancer.  We recommended women in this family have a yearly mammogram beginning at age 83, or 48 years younger than the earliest onset of cancer, an annual clinical breast exam, and perform monthly breast self-exams. Women in this family should also have a gynecological exam as recommended by their primary provider. All family members should be referred for colonoscopy starting at age 67.  It  is also possible there is a hereditary cause for the cancer in Cindy Turner's family that she Turner not inherit and therefore was not identified in her.  Cindy Turner mother and grandmother have declined to be tested.  Based on Cindy Turner's family history, we recommended her brother have genetic counseling and testing. Cindy Turner will let us know if we can be of any assistance in coordinating genetic counseling and/or testing for this family member.   FOLLOW-UP: Lastly, we discussed with Ms. Didonato that cancer genetics is a rapidly advancing field and it is possible that new genetic tests will be appropriate for her and/or her family members in the future. We encouraged her to remain in contact with cancer genetics on an annual basis so we can update her personal and family histories and let her know of advances in cancer genetics that may benefit this family.   Our contact number was provided. Ms. Gatton questions were answered to her satisfaction, and she knows she is welcome to call us at anytime with additional questions or concerns.   Roma Kayser, [place], [facility] Licensed, Certified Genetic Counselor [email or web address]

## 2021-01-20 NOTE — Telephone Encounter (Signed)
Revealed negative genetic testing.  Discussed that we do not know why there is cancer in the family. It could be due to a different gene that we are not testing, or maybe our current technology may not be able to pick something up.  It will be important for her to keep in contact with genetics to keep up with whether additional testing may be needed.  We recommend that another family member undergo genetic testing, such as her mother, grandmother or brother. Her mother, grandmother and maternal aunt have declined or are unable to undergo testing at this time.  She will be referred to the high risk breast clinic.

## 2021-01-28 ENCOUNTER — Telehealth: Payer: Self-pay | Admitting: Hematology and Oncology

## 2021-01-28 NOTE — Telephone Encounter (Signed)
Pt called back and confirmed appt on 1/11 with Dr. Chryl Heck.

## 2021-01-28 NOTE — Telephone Encounter (Signed)
Scheduled appt per 12/12 referral. Called pt, no answer. Left msg with appt date and time per pt vm request. I requested for pt to call me back to confirm appt date and time.

## 2021-01-31 ENCOUNTER — Telehealth: Payer: Self-pay | Admitting: Hematology and Oncology

## 2021-01-31 NOTE — Telephone Encounter (Signed)
Rescheduled appointment per providers schedule, per breast clinic. Left detailed message with new appointment time and date.

## 2021-02-18 ENCOUNTER — Other Ambulatory Visit: Payer: Self-pay

## 2021-02-18 ENCOUNTER — Inpatient Hospital Stay: Payer: 59

## 2021-02-18 ENCOUNTER — Inpatient Hospital Stay: Payer: 59 | Attending: Genetic Counselor | Admitting: Hematology and Oncology

## 2021-02-18 ENCOUNTER — Encounter: Payer: Self-pay | Admitting: Hematology and Oncology

## 2021-02-18 VITALS — BP 119/61 | HR 80 | Temp 97.9°F | Resp 16 | Ht 67.0 in | Wt 148.4 lb

## 2021-02-18 DIAGNOSIS — Z1501 Genetic susceptibility to malignant neoplasm of breast: Secondary | ICD-10-CM | POA: Diagnosis present

## 2021-02-18 DIAGNOSIS — Z9189 Other specified personal risk factors, not elsewhere classified: Secondary | ICD-10-CM

## 2021-02-18 DIAGNOSIS — N809 Endometriosis, unspecified: Secondary | ICD-10-CM | POA: Diagnosis present

## 2021-02-18 DIAGNOSIS — Z79899 Other long term (current) drug therapy: Secondary | ICD-10-CM | POA: Insufficient documentation

## 2021-02-18 DIAGNOSIS — G43909 Migraine, unspecified, not intractable, without status migrainosus: Secondary | ICD-10-CM | POA: Insufficient documentation

## 2021-02-18 DIAGNOSIS — Z803 Family history of malignant neoplasm of breast: Secondary | ICD-10-CM | POA: Diagnosis present

## 2021-02-18 NOTE — Progress Notes (Signed)
Cindy Turner  Patient Care Team: Pcp, No as PCP - General Reche Dixon, PA-C as Consulting Physician (Orthopedic Surgery) Adrian Prows, MD as Consulting Physician (Cardiology) Sater, Nanine Means, MD (Neurology)  CHIEF COMPLAINTS/PURPOSE OF CONSULTATION:  High risk for breast cancer  ASSESSMENT & PLAN:   This is a very pleasant 36 year old premenopausal female patient with high risk of breast cancer, lifetime risk of 25% given her family history referred to high risk breast cancer clinic for additional recommendations.  She is healthy at baseline except for some endometriosis and possibly need for hysterectomy soon.  She does not have any concerning review of systems.  Physical examination today of bilateral breast without any concerns.  We have discussed the following findings today.  We discussed her life time risk of breast cancer which comes to 25.5% and 5 yr risk of breast cancer today 1.1%  2. We discussed different risk reducing strategies for future breast cancer risk. We discussed chemoprevention and lifestyle modification.  She does not qualify for tamoxifen prevention since her 5-year risk of breast cancer is less than 1.6%.  3. Lifestyle modification: We discussed different interventions exercise at least 5 days a week including both aerobic as well as weight-bearing exercises and strength training.  We discussed dietary modification including increasing number of servings of fruit and vegetables as well as decreased in number of servings of meat. We discussed the importance of maintaining a good BMI and limiting alcohol intake.  4. Surveillance: Patient certainly would be a good candidate for ongoing mammograms on a yearly basis and do annual breast MRIs since her lifetime risk of breast cancer is greater than 20%. We have discussed about unknown long-term risk of long-term MRIs and gadolinium deposition and we have also discussed about increased sensitivity  of MRIs which may occasionally lead to additional biopsies.  She is agreeable and an MRI breast has been ordered.  She was instructed to have this scheduled and to call us if she does not hear from the radiology department.  She expressed understanding.  5. Chemoprevention: Not a candidate at this time.  6. Genetics: Genetic testing negative for any pathogenic mutations  7. Followup: Patient will be seen back in 6 months time in followup.    HISTORY OF PRESENTING ILLNESS:  Cindy Turner 37 y.o. female is here because of high risk for breast cancer.  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  She was noticed to have a lifetime risk of breast cancer per [employer] model of 25.5% and hence referred to high risk breast clinic.  She is here for an initial visit by herself.  She has no major health complaints at baseline except for some endometriosis.  She is contemplating hysterectomy sometime next month and is following up with gynecology.  She otherwise has migraines at baseline and is on Topamax but at an unknown dose.  Rest of the pertinent 10 point ROS reviewed and negative.  Genetic testing on January 15, 2021 showed no evidence of pathogenic mutations.  She however was found to have a lifetime risk of breast cancer of 25.5% based on Tyer Cusick score hence referred to breast clinic for surveillance and recommendations.  Family history significant for breast cancer in mom at the age of 46-30, uterine cancer at 73 and 2 melanomas at 53.  She has 2 sisters 1 of which had a melanoma in her 43s.  Patient's grandmother had breast cancer in her 40s as well.  Father is living and healthy.  He has 2 sisters and a brother who are cancer free.  His father had a melanoma at 48 and died at 51.  REVIEW OF SYSTEMS:   Constitutional:  Denies fevers, chills or abnormal night sweats Eyes: Denies blurriness of vision, double vision or watery eyes Ears, nose, mouth, throat, and face: Denies mucositis or sore throat Respiratory: Denies cough, dyspnea or wheezes Cardiovascular: Denies palpitation, chest discomfort or lower extremity swelling Gastrointestinal:  Denies nausea, heartburn or change in bowel habits Skin: Denies abnormal skin rashes Lymphatics: Denies new lymphadenopathy or easy bruising Neurological:Denies numbness, tingling or new weaknesses Behavioral/Psych: Mood is stable, no new changes  All other systems were reviewed with the patient and are negative.  MEDICAL HISTORY:  Past Medical History:  Diagnosis Date   Allergy    Family history of melanoma    Fibromyalgia    Migraines    Raynaud's disease    Ureterolithiasis     SURGICAL HISTORY: Past Surgical History:  Procedure Laterality Date   APPENDECTOMY     BREAST BIOPSY Left    DILATION AND CURETTAGE OF UTERUS     kidney stent     LAPAROSCOPY     TONSILLECTOMY     TUBAL LIGATION     TUBAL LIGATION      SOCIAL HISTORY: Social History   Socioeconomic History   Marital status: Married    Spouse name: Not on file   Number of children: Not on file   Years of education: Not on file   Highest education level: Not on file  Occupational History   Not on file  Tobacco Use   Smoking status: Never   Smokeless tobacco: Never  Vaping Use   Vaping Use: Never used  Substance and Sexual Activity   Alcohol use: No   Drug use: No   Sexual activity: Not on file    Comment: tubal ligation  Other Topics Concern   Not on file  Social History Narrative   Not on file   Social Determinants of Health   Financial Resource Strain: Not on file  Food Insecurity: Not on file  Transportation Needs: Not on file  Physical Activity: Not on file  Stress: Not on file  Social Connections: Not on file  Intimate Partner Violence: Not on file    FAMILY  HISTORY: Family History  Problem Relation Age of Onset   Hypertension Mother    Arthritis Mother    Breast cancer Mother 67   Uterine cancer Mother    Legg-Calve-Perthes disease Father    Healthy Brother    Melanoma Maternal Aunt        dx in her 77s   Breast cancer Maternal Grandmother        dx in her 89s   Throat cancer Maternal Grandfather        smoker; dx in 73s   Melanoma Paternal Grandfather    Hypertension Paternal Grandfather    Diabetes Paternal Grandfather    Heart attack Paternal Grandfather    Asthma Daughter    Healthy Daughter    Lung cancer Other     ALLERGIES:  is allergic to pineapple, terbutaline sulfate, vitamin a, and latex.  MEDICATIONS:  Current Outpatient Medications  Medication Sig Dispense Refill   lidocaine (LIDODERM) 5 % Place 1 patch onto the skin  daily. Remove & Discard patch within 12 hours or as directed by MD 30 patch 0   methocarbamol (ROBAXIN) 500 MG tablet Take 1 tablet (500 mg total) by mouth every 8 (eight) hours as needed for muscle spasms. 20 tablet 0   No current facility-administered medications for this visit.   Facility-Administered Medications Ordered in Other Visits  Medication Dose Route Frequency Provider Last Rate Last Admin   gadopentetate dimeglumine (MAGNEVIST) injection 12 mL  12 mL Intravenous Once PRN Sater, Nanine Means, MD         PHYSICAL EXAMINATION: ECOG PERFORMANCE STATUS: 0 - Asymptomatic  Vitals:   02/18/21 1046  BP: 119/61  Pulse: 80  Resp: 16  Temp: 97.9 F (36.6 C)  SpO2: 100%   Filed Weights   02/18/21 1046  Weight: 148 lb 6.4 oz (67.3 kg)    GENERAL:alert, no distress and comfortable SKIN: skin color, texture, turgor are normal, no rashes or significant lesions EYES: normal, conjunctiva are pink and non-injected, sclera clear OROPHARYNX:no exudate, no erythema and lips, buccal mucosa, and tongue normal  NECK: supple, thyroid normal size, non-tender, without nodularity LYMPH:  no palpable  lymphadenopathy in the cervical, axillary or inguinal LUNGS: clear to auscultation and percussion with normal breathing effort HEART: regular rate & rhythm and no murmurs and no lower extremity edema ABDOMEN:abdomen soft, non-tender and normal bowel sounds Musculoskeletal:no cyanosis of digits and no clubbing  PSYCH: alert & oriented x 3 with fluent speech NEURO: no focal motor/sensory deficits Breast: Bilateral breasts inspected, no concerns, no regional adenopathy.  LABORATORY DATA:  I have reviewed the data as listed Lab Results  Component Value Date   WBC 3.0 (L) 12/31/2017   HGB 12.5 12/31/2017   HCT 41.5 12/31/2017   MCV 75.3 (L) 12/31/2017   PLT 179 12/31/2017     Chemistry      Component Value Date/Time   NA 136 12/31/2017 1138   NA 139 03/04/2017 1518   K 4.2 12/31/2017 1138   CL 106 12/31/2017 1138   CO2 23 12/31/2017 1138   BUN 7 12/31/2017 1138   BUN 10 03/04/2017 1518   CREATININE 0.74 12/31/2017 1138      Component Value Date/Time   CALCIUM 9.1 12/31/2017 1138   ALKPHOS 42 12/31/2017 1138   AST 21 12/31/2017 1138   ALT 19 12/31/2017 1138   BILITOT 0.5 12/31/2017 1138   BILITOT 0.8 03/04/2017 1518       RADIOGRAPHIC STUDIES: I have personally reviewed the radiological images as listed and agreed with the findings in the report. No results found.  All questions were answered. The patient knows to call the clinic with any problems, questions or concerns. I spent 45 minutes in the care of this patient including H and P, review of records, counseling and coordination of care. Please refer to the above-mentioned assessment and plan for details of counseling.    Benay Pike, MD 02/18/2021 10:53 AM

## 2021-02-19 ENCOUNTER — Inpatient Hospital Stay: Payer: 59 | Admitting: Hematology and Oncology

## 2021-02-19 ENCOUNTER — Inpatient Hospital Stay: Payer: 59

## 2021-02-27 ENCOUNTER — Ambulatory Visit
Admission: RE | Admit: 2021-02-27 | Discharge: 2021-02-27 | Disposition: A | Discharge status: Home / Self Care | Payer: 59 | Source: Ambulatory Visit | Attending: Hematology and Oncology | Admitting: Hematology and Oncology

## 2021-02-27 ENCOUNTER — Other Ambulatory Visit: Payer: Self-pay

## 2021-02-27 DIAGNOSIS — Z9189 Other specified personal risk factors, not elsewhere classified: Secondary | ICD-10-CM

## 2021-02-27 DIAGNOSIS — Z803 Family history of malignant neoplasm of breast: Secondary | ICD-10-CM

## 2021-02-27 MED ORDER — GADOBUTROL 1 MMOL/ML IV SOLN
7.0000 mL | Freq: Once | INTRAVENOUS | Status: AC | PRN
  Administered 2021-02-27: 7 mL via INTRAVENOUS

## 2021-03-03 ENCOUNTER — Emergency Department (HOSPITAL_COMMUNITY): Payer: 59

## 2021-03-03 ENCOUNTER — Emergency Department (HOSPITAL_BASED_OUTPATIENT_CLINIC_OR_DEPARTMENT_OTHER)
Admission: EM | Admit: 2021-03-03 | Discharge: 2021-03-04 | Disposition: A | Discharge status: Home / Self Care | Payer: 59 | Attending: Emergency Medicine | Admitting: Emergency Medicine

## 2021-03-03 ENCOUNTER — Encounter (HOSPITAL_BASED_OUTPATIENT_CLINIC_OR_DEPARTMENT_OTHER): Payer: Self-pay

## 2021-03-03 ENCOUNTER — Emergency Department (HOSPITAL_BASED_OUTPATIENT_CLINIC_OR_DEPARTMENT_OTHER): Payer: 59

## 2021-03-03 ENCOUNTER — Other Ambulatory Visit: Payer: Self-pay

## 2021-03-03 DIAGNOSIS — R2990 Unspecified symptoms and signs involving the nervous system: Secondary | ICD-10-CM | POA: Insufficient documentation

## 2021-03-03 DIAGNOSIS — G43809 Other migraine, not intractable, without status migrainosus: Secondary | ICD-10-CM | POA: Insufficient documentation

## 2021-03-03 DIAGNOSIS — Z91040 Latex allergy status: Secondary | ICD-10-CM | POA: Insufficient documentation

## 2021-03-03 DIAGNOSIS — N9489 Other specified conditions associated with female genital organs and menstrual cycle: Secondary | ICD-10-CM | POA: Diagnosis not present

## 2021-03-03 DIAGNOSIS — R531 Weakness: Secondary | ICD-10-CM | POA: Diagnosis present

## 2021-03-03 LAB — COMPREHENSIVE METABOLIC PANEL
ALT: 9 U/L (ref 0–44)
AST: 11 U/L — ABNORMAL LOW (ref 15–41)
Albumin: 4.8 g/dL (ref 3.5–5.0)
Alkaline Phosphatase: 39 U/L (ref 38–126)
Anion gap: 10 (ref 5–15)
BUN: 11 mg/dL (ref 6–20)
CO2: 22 mmol/L (ref 22–32)
Calcium: 9.6 mg/dL (ref 8.9–10.3)
Chloride: 105 mmol/L (ref 98–111)
Creatinine, Ser: 0.8 mg/dL (ref 0.44–1.00)
GFR, Estimated: >60 mL/min (ref >60)
Glucose, Bld: 90 mg/dL (ref 70–99)
Potassium: 4 mmol/L (ref 3.5–5.1)
Sodium: 137 mmol/L (ref 135–145)
Total Bilirubin: 0.5 mg/dL (ref 0.3–1.2)
Total Protein: 7.8 g/dL (ref 6.5–8.1)

## 2021-03-03 LAB — CBC
HCT: 43 % (ref 36.0–46.0)
Hemoglobin: 13.3 g/dL (ref 12.0–15.0)
MCH: 23.6 pg — ABNORMAL LOW (ref 26.0–34.0)
MCHC: 30.9 g/dL (ref 30.0–36.0)
MCV: 76.4 fL — ABNORMAL LOW (ref 80.0–100.0)
Platelets: 260 10*3/uL (ref 150–400)
RBC: 5.63 MIL/uL — ABNORMAL HIGH (ref 3.87–5.11)
RDW: 15.5 % (ref 11.5–15.5)
WBC: 5.1 10*3/uL (ref 4.0–10.5)
nRBC: 0 % (ref 0.0–0.2)

## 2021-03-03 LAB — HCG, SERUM, QUALITATIVE: Preg, Serum: NEGATIVE

## 2021-03-03 LAB — MAGNESIUM: Magnesium: 2.3 mg/dL (ref 1.7–2.4)

## 2021-03-03 MED ORDER — SODIUM CHLORIDE 0.9 % IV BOLUS
1000.0000 mL | Freq: Once | INTRAVENOUS | Status: AC
  Administered 2021-03-03: 1000 mL via INTRAVENOUS

## 2021-03-03 MED ORDER — ONDANSETRON HCL 4 MG/2ML IJ SOLN
4.0000 mg | Freq: Once | INTRAMUSCULAR | Status: AC
  Administered 2021-03-03: 4 mg via INTRAVENOUS
  Filled 2021-03-03: qty 2

## 2021-03-03 MED ORDER — LORAZEPAM 1 MG PO TABS
1.0000 mg | ORAL_TABLET | Freq: Once | ORAL | Status: AC
Start: 2021-03-03 — End: 2021-03-03
  Administered 2021-03-03: 1 mg via ORAL
  Filled 2021-03-03: qty 1

## 2021-03-03 MED ORDER — GADOBUTROL 1 MMOL/ML IV SOLN
6.5000 mL | Freq: Once | INTRAVENOUS | Status: AC | PRN
  Administered 2021-03-03: 6.5 mL via INTRAVENOUS

## 2021-03-03 MED ORDER — METOCLOPRAMIDE HCL 5 MG/ML IJ SOLN
10.0000 mg | Freq: Once | INTRAMUSCULAR | Status: AC
  Administered 2021-03-03: 10 mg via INTRAVENOUS
  Filled 2021-03-03: qty 2

## 2021-03-03 NOTE — ED Provider Notes (Signed)
[facility] EMERGENCY DEPT Provider Note   CSN: [id number] Arrival date & time: 03/03/21  1430     History  Chief Complaint  Patient presents with   Headache   Numbness   Dizziness    Cindy Turner is a 36 y.o. female.  36 year old female with history of migraines presents with 2 weeks of intermittent left-sided head paresthesias and left upper and left lower extremity weakness.  Most recent incident was yesterday when she woke up she felt that her tongue was numb.  States she had trouble ambulating.  Had a mild headache associated with it.  Denies any photophobia.  No phonophobia.  Takes Topamax daily for headaches.  Symptoms went away by themself.  Returned again today.  Notes she is dizzy with movements and feels off balance.      Home Medications Prior to Admission medications   Not on File      Allergies    Pineapple, Terbutaline sulfate, Vitamin a, and Latex    Review of Systems   Review of Systems  All other systems reviewed and are negative.  Physical Exam Updated Vital Signs BP 115/77    Pulse (!) 57    Temp 98.5 F (36.9 C) (Oral)    Resp 19    Wt 64.2 kg    SpO2 100%    BMI 22.17 kg/m  Physical Exam Vitals and nursing note reviewed.  Constitutional:      General: She is not in acute distress.    Appearance: Normal appearance. She is well-developed. She is not toxic-appearing.  HENT:     Head: Normocephalic and atraumatic.  Eyes:     General: Lids are normal.     Conjunctiva/sclera: Conjunctivae normal.     Pupils: Pupils are equal, round, and reactive to light.  Neck:     Thyroid: No thyroid mass.     Trachea: No tracheal deviation.  Cardiovascular:     Rate and Rhythm: Normal rate and regular rhythm.     Heart sounds: Normal heart sounds. No murmur heard.   No gallop.  Pulmonary:     Effort: Pulmonary effort is normal. No respiratory distress.     Breath sounds: Normal breath sounds. No stridor. No decreased breath sounds,  wheezing, rhonchi or rales.  Abdominal:     General: There is no distension.     Palpations: Abdomen is soft.     Tenderness: There is no abdominal tenderness. There is no rebound.  Musculoskeletal:        General: No tenderness. Normal range of motion.     Cervical back: Normal range of motion and neck supple.  Skin:    General: Skin is warm and dry.     Findings: No abrasion or rash.  Neurological:     Mental Status: She is alert and oriented to person, place, and time. Mental status is at baseline.     GCS: GCS eye subscore is 4. GCS verbal subscore is 5. GCS motor subscore is 6.     Cranial Nerves: No cranial nerve deficit.     Sensory: No sensory deficit.     Gait: Gait abnormal.     Comments: Patient off balance when she stood up.  4 5 strength in left upper and left lower extremity.  Normal on the right.  No facial drooping noted.  Psychiatric:        Attention and Perception: Attention normal.        Speech: Speech normal.  Behavior: Behavior normal.    ED Results / Procedures / Treatments   Labs (all labs ordered are listed, but only abnormal results are displayed) Labs Reviewed  CBC - Abnormal; Notable for the following components:      Result Value   RBC 5.63 (*)    MCV 76.4 (*)    MCH 23.6 (*)    All other components within normal limits  COMPREHENSIVE METABOLIC PANEL - Abnormal; Notable for the following components:   AST 11 (*)    All other components within normal limits  MAGNESIUM  HCG, SERUM, QUALITATIVE    EKG EKG Interpretation  Date/Time:  Monday March 03 2021 14:48:26 EST Ventricular Rate:  70 PR Interval:  110 QRS Duration: 106 QT Interval:  384 QTC Calculation: 414 R Axis:   133 Text Interpretation: Sinus rhythm with short PR Incomplete right bundle branch block Left posterior fascicular block Abnormal ECG When compared with ECG of 17-Nov-2016 14:38, PREVIOUS ECG IS PRESENT No significant change since last tracing Confirmed by Lacretia Leigh ([id number]) on 03/03/2021 3:49:39 PM  Radiology No results found.  Procedures Procedures    Medications Ordered in ED Medications  sodium chloride 0.9 % bolus 1,000 mL (1,000 mLs Intravenous New Bag/Given 03/03/21 1511)  ondansetron (ZOFRAN) injection 4 mg (4 mg Intravenous Given 03/03/21 1510)    ED Course/ Medical Decision Making/ A&P                           Medical Decision Making Old records reviewed.  EKG per my interpretation without signs of ischemia. Patient does not meet criteria for code stroke at this time.  Concern for possible neurological process and head CT without acute findings here.  Labs are reassuring.  Discussed with neurologist on-call about concern for possible MS as etiology for symptoms.  He agrees with this and like patient have MRI of her brain and cervical spine with and without contrast.  We will need to transfer patient to [facility] to have this test and for possible neurological consultation.  Patient and has been informed        Final Clinical Impression(s) / ED Diagnoses Final diagnoses:  None    Rx / DC Orders ED Discharge Orders     None         Lacretia Leigh, MD 03/03/21 1721

## 2021-03-03 NOTE — ED Notes (Signed)
Patient transferred to [facility] ER via [facility]. Roselyn Reef, RN charge at [facility] cone made aware of transfer.

## 2021-03-03 NOTE — Plan of Care (Signed)
ON CALL PHONE CONSULT  Call from Dr Zenia Resides @ [facility]   Discussion: intermittent left sided parestheisas, difficulty walking.  Recommended MRI brain and c-spin w+w/o to r/o demyelination. ED to ED tx Please call if MRI positive.  -- Amie Portland, MD Neurologist [facility] Pager: [phone]

## 2021-03-03 NOTE — ED Notes (Signed)
Patient still in MRI at this time

## 2021-03-03 NOTE — ED Triage Notes (Signed)
Pt states she woke up around 630 yesterday morning with numbness to the left side of her face, left arm, trouble speaking, and dizziness. Pt state this has happened about 4 times this month. Hx of migraines where she occasionally get numbness to the left side of her face but states these symptoms are more severe.

## 2021-03-03 NOTE — ED Notes (Signed)
Patient transported to MRI 

## 2021-03-03 NOTE — ED Notes (Signed)
Report given to [facility]

## 2021-03-03 NOTE — ED Notes (Signed)
Pt arrived to the ED from [place] for MRI

## 2021-03-04 MED ORDER — GABAPENTIN 600 MG PO TABS
300.0000 mg | ORAL_TABLET | Freq: Once | ORAL | Status: DC
Start: 2021-03-04 — End: 2021-03-04

## 2021-03-04 MED ORDER — GABAPENTIN 300 MG PO CAPS: 300.0000 mg | ORAL_CAPSULE | Freq: Three times a day (TID) | ORAL | 1 refills | Status: DC | PRN

## 2021-03-04 MED ORDER — GABAPENTIN 300 MG PO CAPS
300.0000 mg | ORAL_CAPSULE | Freq: Once | ORAL | Status: AC
  Administered 2021-03-04: 01:00:00 300 mg via ORAL
  Filled 2021-03-04: qty 1

## 2021-03-04 NOTE — ED Provider Notes (Signed)
°  Provider Note MRN:  [id number]  [place] date & time: 03/04/21    ED Course and Medical Decision Making  Assumed care from Dr. Zenia Resides upon patient transfer  Left-sided neurological deficits in the setting of headache, transferred here for MRI.  MRI is reassuring, no evidence of stroke or MS.  Nonspecific findings could be due to migraines.  Patient continues to have some left arm paresthesia, does not have any objective neurological deficits at this time.  Case discussed with Dr. Leonel Ramsay, recommending gabapentin, close outpatient follow-up.  Patient is agreeable with this plan.  Procedures  Final Clinical Impressions(s) / ED Diagnoses     ICD-10-CM   1. Weakness  R53.1     2. Other migraine without status migrainosus, not intractable  G43.809     3. Neurological symptoms  R29.90       ED Discharge Orders          Ordered    gabapentin (NEURONTIN) 300 MG capsule  3 times daily PRN        03/04/21 0120    Ambulatory referral to Neurology       Comments: An appointment is requested in approximately: 2 weeks   03/04/21 0121              Discharge Instructions      You were evaluated in the Emergency Department and after careful evaluation, we did not find any emergent condition requiring admission or further testing in the hospital.  Your exam/testing today was overall reassuring.  Symptoms seem to be due to a complex migraine.  Your MRI did not show any signs of stroke or multiple sclerosis.  Our neurology team is recommending gabapentin 300 mg up to 3 times daily for your neurological symptoms.  Recommend follow-up with the neurologist as an outpatient.  Please return to the Emergency Department if you experience any worsening of your condition.  Thank you for allowing [place] to be a part of your care.     Barth Kirks. Sedonia Small, [place] [email or web address]    Maudie Flakes, MD 03/04/21 [phone]

## 2021-03-04 NOTE — ED Notes (Signed)
Pt ambulated to restroom c/o dizziness. Provider made aware. Per pt she is ok to go home.

## 2021-03-04 NOTE — ED Notes (Signed)
Provider at bedside

## 2021-03-04 NOTE — Discharge Instructions (Signed)
You were evaluated in the Emergency Department and after careful evaluation, we did not find any emergent condition requiring admission or further testing in the hospital.  Your exam/testing today was overall reassuring.  Symptoms seem to be due to a complex migraine.  Your MRI did not show any signs of stroke or multiple sclerosis.  Our neurology team is recommending gabapentin 300 mg up to 3 times daily for your neurological symptoms.  Recommend follow-up with the neurologist as an outpatient.  Please return to the Emergency Department if you experience any worsening of your condition.  Thank you for allowing [place] to be a part of your care.

## 2021-05-14 NOTE — Progress Notes (Signed)
? ?[facility] ? ?PATIENT: Cindy Turner ?DOB: [birth year] ? ?REFERRING DOCTOR OR PCP: Gerlene Fee, MD, Lincoln Brigham, MD ?SOURCE: Patient, previous notes from [facility] neurology, imaging and lab reports, MRI images personally reviewed. ? ?_________________________________ ? ? ?HISTORICAL ? ?CHIEF COMPLAINT:  ?Chief Complaint  ?Patient presents with  ? New Patient (Initial Visit)  ?  Rm 1, alone. Pt referred for eval of HA and L arm paresthesia. Pt has had migraine episodes were face goes numb, tongue gets numb and heavy and unable to speak. Pt though she was having a stroke and went to the hospital and dx w Complex migraine, had CT and MRI done.  ?Migraine 2-3x week. Currently has HA since Friday of last week. Recently got prescription glasses to help w vision problems. Topiramate helps but doesn't eliminate pn. HA become worse when laying down flat. Waking up w HA.   ? ? ?HISTORY OF PRESENT ILLNESS:  ?I had the pleasure seeing your patient, Cindy Turner, and [facility] for neurologic consultation regarding her  ? ?She is a 36 year old woman who had episodes of presyncope, left facial numbness, left >>right hand numbness over the past few years.   Her 4th episode, 02/11/2021, occurred at work, she had the left sided numbness, her tongue felt thick and she had some trouble gettting words out and her gait was off balanced with some left leg weakness.  She went to the [facility] and was transferred to [facility] ED.   She had a severe right pounding headache.  She was evaluated and had MRI of the brain and cervical spine.  The brain showed a couple punctate T2/FLAIR hyperintense foci in the subcortical white matter.  This is nonspecific and could be related to migraines but also could be due to cardio emboli.  The cervical spine showed a minimal disc bulge at C5-C6 with no nerve root compression or spinal stenosis. ? ?She has migraines twice a week, half  debilitating pain (20/30 days a month).  Some last several days.   She has pounding right frontal pain, nausea/vomiting, photophobia/phonophobia.  Moving worsens the pain.   She was on topiramate 75 mg and it helps a little at first to reduce the intensity.    She was on sumatriptan and it helped but the pain rebounds and it makes her sleepy.   She takes 800 mg ibuprofen, 1000 mg Tylenol and Benadryl and lays down.    ? ?She has a strong FH of migraines:  Mother, brother and maternal side aunt.   Her brother and aunt also have similar neurologic symptoms c/w complicated migraine.   Her 20 and 25 year old daughter have migraine and the 29 year old has syncope associated with her migraines as well as visual changes and poor balance.    ? ?I saw her as a patient in 2018.  At that time, she had had multiple episodes of syncope and presyncope.  She has a cardiac work-up as part of a POTS evaluation in 2018.   Her pulse is generally high.   EKG earlier this year was sinus rhythm with short PR and incomplete RBBB and left posterior fascicular block.     ? ? ?Imaging (personally reviewed) ?MRI of the head and cervical spine 03/03/2021: There are a couple punctate T2/FLAIR hyperintense foci in the subcortical white matter of the frontal lobes.  The brain is otherwise normal.  Cervical spine is normal with just a very minimal bulge at C5-C6. ? ?  REVIEW OF SYSTEMS: ?Constitutional: No fevers, chills, sweats, or change in appetite ?Eyes: No visual changes, double vision, eye pain ?Ear, nose and throat: No hearing loss, ear pain, nasal congestion, sore throat ?Cardiovascular: No chest pain, palpitations.  Abnormal EKG. ?Respiratory:  No shortness of breath at rest or with exertion.   No wheezes ?GastrointestinaI: No nausea, vomiting, diarrhea, abdominal pain, fecal incontinence ?Genitourinary:  No dysuria, urinary retention or frequency.  No nocturia. ?Musculoskeletal:  No neck pain, back pain ?Integumentary: No rash, pruritus,  skin lesions ?Neurological: as above ?Psychiatric: No depression at this time.  No anxiety ?Endocrine: No palpitations, diaphoresis, change in appetite, change in weigh or increased thirst ?Hematologic/Lymphatic:  No anemia, purpura, petechiae. ?Allergic/Immunologic: No itchy/runny eyes, nasal congestion, recent allergic reactions, rashes ? ?ALLERGIES: ?Allergies  ?Allergen Reactions  ? Pineapple Anaphylaxis, Itching and Rash  ? Magnesium Sulfate   ?  Other reaction(s): Bradycardia, Confusion, Decreased Blood Pressure, Irregular Heart Rate, Other (See Comments) ?HIGH HEART RATE ?HIGH HEART RATE ?  ? Terbutaline Sulfate Other (See Comments)  ?  Arrhythmia  ? Vitamin A Other (See Comments)  ?  Headaches   ? Latex Rash  ? ? ?HOME MEDICATIONS: ? ?Current Outpatient Medications:  ?  topiramate (TOPAMAX) 25 MG tablet, Take 75 mg by mouth at bedtime., Disp: , Rfl:  ? ?PAST MEDICAL HISTORY: ?Past Medical History:  ?Diagnosis Date  ? Allergy   ? Family history of melanoma   ? Fibromyalgia   ? Migraines   ? Raynaud's disease   ? Ureterolithiasis   ? ? ?PAST SURGICAL HISTORY: ?Past Surgical History:  ?Procedure Laterality Date  ? APPENDECTOMY    ? BREAST BIOPSY Left   ? DILATION AND CURETTAGE OF UTERUS    ? kidney stent    ? LAPAROSCOPY    ? TONSILLECTOMY    ? TUBAL LIGATION    ? TUBAL LIGATION    ? ? ?FAMILY HISTORY: ?Family History  ?Problem Relation Age of Onset  ? Hypertension Mother   ? Arthritis Mother   ? Breast cancer Mother 73  ? Uterine cancer Mother   ? Legg-Calve-Perthes disease Father   ? Healthy Brother   ? Melanoma Maternal Aunt   ?     dx in her 4s  ? Breast cancer Maternal Grandmother   ?     dx in her 18s  ? Throat cancer Maternal Grandfather   ?     smoker; dx in 41s  ? Melanoma Paternal Grandfather   ? Hypertension Paternal Grandfather   ? Diabetes Paternal Grandfather   ? Heart attack Paternal Grandfather   ? Asthma Daughter   ? Healthy Daughter   ? Lung cancer Other   ? ? ?SOCIAL HISTORY: ? ?Social  History  ? ?Socioeconomic History  ? Marital status: Married  ?  Spouse name: Mitzi Hansen  ? Number of children: 2  ? Years of education: Not on file  ? Highest education level: Some college, no degree  ?Occupational History  ? Not on file  ?Tobacco Use  ? Smoking status: Never  ? Smokeless tobacco: Never  ?Vaping Use  ? Vaping Use: Never used  ?Substance and Sexual Activity  ? Alcohol use: No  ? Drug use: No  ? Sexual activity: Not on file  ?  Comment: tubal ligation  ?Other Topics Concern  ? Not on file  ?Social History Narrative  ? Lives at home with husband and children  ? Right handed  ? Caffeine: 1 C of  coffee a day, sometimes 2. Zero sugar dr pepper 1x week.  ? ?Social Determinants of Health  ? ?Financial Resource Strain: Not on file  ?Food Insecurity: Not on file  ?Transportation Needs: Not on file  ?Physical Activity: Not on file  ?Stress: Not on file  ?Social Connections: Not on file  ?Intimate Partner Violence: Not on file  ? ? ? ?PHYSICAL EXAM ? ?Vitals:  ? 05/15/21 1034  ?BP: 129/76  ?Pulse: 67  ?Weight: 142 lb (64.4 kg)  ?Height: '5\' 7"'$  (1.702 m)  ? ? ?Body mass index is 22.24 kg/m?. ? ? ?General: The patient is well-developed and well-nourished and in no acute distress ? ?HEENT:  Head is /AT.  Sclera are anicteric.  Funduscopic exam shows normal optic discs and retinal vessels. ? ?Neck: No carotid bruits are noted.  The neck is tender over the right occiput. ? ?Cardiovascular: The heart has a regular rate and rhythm with a normal S1 and S2. There were no murmurs, gallops or rubs.   ? ?Skin: Extremities are without rash or  edema. ? ?Musculoskeletal:  Back is nontender ? ?Neurologic Exam ? ?Mental status: The patient is alert and oriented x 3 at the time of the examination. The patient has apparent normal recent and remote memory, with an apparently normal attention span and concentration ability.   Speech is normal. ? ?Cranial nerves: Extraocular movements are full. Pupils are equal, round, and reactive  to light and accomodation.  Visual fields are full.  Facial symmetry is present. There is good facial sensation to soft touch bilaterally.Facial strength is normal.  Trapezius and sternocleidomastoid strength is nor

## 2021-05-15 ENCOUNTER — Telehealth: Payer: Self-pay | Admitting: *Deleted

## 2021-05-15 ENCOUNTER — Telehealth: Payer: Self-pay | Admitting: Neurology

## 2021-05-15 ENCOUNTER — Encounter: Payer: Self-pay | Admitting: Neurology

## 2021-05-15 ENCOUNTER — Ambulatory Visit: Payer: 59 | Admitting: Neurology

## 2021-05-15 VITALS — BP 129/76 | HR 67 | Ht 67.0 in | Wt 142.0 lb

## 2021-05-15 DIAGNOSIS — G43109 Migraine with aura, not intractable, without status migrainosus: Secondary | ICD-10-CM

## 2021-05-15 DIAGNOSIS — R9082 White matter disease, unspecified: Secondary | ICD-10-CM | POA: Diagnosis not present

## 2021-05-15 DIAGNOSIS — R200 Anesthesia of skin: Secondary | ICD-10-CM | POA: Diagnosis not present

## 2021-05-15 DIAGNOSIS — M542 Cervicalgia: Secondary | ICD-10-CM | POA: Diagnosis not present

## 2021-05-15 DIAGNOSIS — Q2112 Patent foramen ovale: Secondary | ICD-10-CM

## 2021-05-15 DIAGNOSIS — R9431 Abnormal electrocardiogram [ECG] [EKG]: Secondary | ICD-10-CM | POA: Diagnosis not present

## 2021-05-15 DIAGNOSIS — R55 Syncope and collapse: Secondary | ICD-10-CM

## 2021-05-15 MED ORDER — EMGALITY 120 MG/ML ~~LOC~~ SOAJ: 1.0000 "pen " | SUBCUTANEOUS | 11 refills | Status: DC

## 2021-05-15 MED ORDER — RIZATRIPTAN BENZOATE 10 MG PO TABS: 10.0000 mg | ORAL_TABLET | ORAL | 11 refills | Status: DC | PRN

## 2021-05-15 NOTE — Telephone Encounter (Signed)
Pt called wanting to know the name of her injection medication and dosage from today's appt 05/15/21. Would like a call back, pt would like a voicemail if no answer.  ?

## 2021-05-15 NOTE — Telephone Encounter (Signed)
Called and LVM relaying Dr. Garth Bigness message: "I did TPI (splenius capitus and paraspinal musles) with Depomedrol and marcaine and gave the 2 x 120 mg Emgality loading dose " ?Asked her to call back if she has any more questions. ?

## 2021-05-15 NOTE — Telephone Encounter (Signed)
Initiated PA Emgality on CMM. Key: [place]. In process of completing.  ?

## 2021-05-15 NOTE — Telephone Encounter (Signed)
Faxed complete/signed [facility] lab order to [phone]. Received fax confirmation. 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  ?

## 2021-05-19 ENCOUNTER — Telehealth: Payer: Self-pay | Admitting: Neurology

## 2021-05-19 NOTE — Telephone Encounter (Signed)
Submitted PA. Key: [id number]. Waiting on determination from [employer]. ?

## 2021-05-19 NOTE — Telephone Encounter (Signed)
[employer] auth: no auth req via [facility], sent Butch Penny a message. She will reach out to the patient to schedule.  ?

## 2021-05-20 NOTE — Telephone Encounter (Signed)
I called [employer]. They received the order and the test kit should be mailed out by Friday. ?

## 2021-05-20 NOTE — Telephone Encounter (Signed)
LVM for pt to call office

## 2021-05-20 NOTE — Telephone Encounter (Signed)
PA denied:  ?"The request for coverage for Emgality Inj '120mg'$ /Ml, use as directed (13m per month), is denied. This ?decision is based on health plan criteria for Emgality. This medicine is covered only if: ?You have failed (after a trial of at least two months) or cannot use two of the following preventive ?therapies (document name and date tried): ?(A) Amitriptyline (Elavil). ?(B) A beta-blocker (that is, atenolol, metoprolol, nadolol, propranolol, or timolol). ?(C) Candesartan (Atacand). ?(D) Divalproex sodium (Depakote/Depakote ER). ?(E) OnabotulinumtoxinA (Botox). [Note: Coverage of onabotulinumtoxinA (Botox) may be subject to ?additional benefit and coverage review requirements]. ?(F) Topiramate (Topamax). ?(G) Venlafaxine (Effexor/Effexor XR" ? ?Pt has only tried/failed: topamax ?

## 2021-05-22 ENCOUNTER — Other Ambulatory Visit: Payer: Self-pay | Admitting: *Deleted

## 2021-05-22 DIAGNOSIS — G43109 Migraine with aura, not intractable, without status migrainosus: Secondary | ICD-10-CM

## 2021-05-22 MED ORDER — VENLAFAXINE HCL ER 75 MG PO CP24: 75.0000 mg | ORAL_CAPSULE | Freq: Every day | ORAL | 11 refills | Status: DC

## 2021-05-22 NOTE — Telephone Encounter (Signed)
I called client services and spoke w/ Aisha. States we cannot cancel lab order over the phone, we have to send request via fax. Client services fax# [phone]. I faxed request for cx and received fax confirmation. ?

## 2021-05-27 NOTE — Telephone Encounter (Signed)
Received fax from Cindy Turner that they could not locate pt in their system to cx test. I called [name] and spoke w/ [facility]. She was able to locate pt and cx test. ?

## 2021-06-03 ENCOUNTER — Ambulatory Visit (HOSPITAL_COMMUNITY)
Admission: RE | Admit: 2021-06-03 | Discharge: 2021-06-03 | Disposition: A | Discharge status: Home / Self Care | Payer: 59 | Source: Ambulatory Visit | Attending: Neurology | Admitting: Neurology

## 2021-06-03 DIAGNOSIS — R9082 White matter disease, unspecified: Secondary | ICD-10-CM | POA: Insufficient documentation

## 2021-06-03 DIAGNOSIS — G43109 Migraine with aura, not intractable, without status migrainosus: Secondary | ICD-10-CM | POA: Insufficient documentation

## 2021-06-03 DIAGNOSIS — R9431 Abnormal electrocardiogram [ECG] [EKG]: Secondary | ICD-10-CM | POA: Diagnosis not present

## 2021-06-03 DIAGNOSIS — Q2112 Patent foramen ovale: Secondary | ICD-10-CM | POA: Insufficient documentation

## 2021-06-03 LAB — ECHOCARDIOGRAM COMPLETE BUBBLE STUDY
AR max vel: 2.62 cm2
AV Peak grad: 6.4 mmHg
Ao pk vel: 1.26 m/s
Area-P 1/2: 3.31 cm2
Calc EF: 64.5 %
S' Lateral: 2.4 cm
Single Plane A2C EF: 63.7 %
Single Plane A4C EF: 64.5 %

## 2021-06-16 ENCOUNTER — Other Ambulatory Visit: Payer: Self-pay | Admitting: Neurology

## 2021-06-16 DIAGNOSIS — G43109 Migraine with aura, not intractable, without status migrainosus: Secondary | ICD-10-CM

## 2021-08-14 ENCOUNTER — Ambulatory Visit: Payer: 59 | Admitting: Neurology

## 2021-08-14 ENCOUNTER — Encounter: Payer: Self-pay | Admitting: Neurology

## 2021-08-18 ENCOUNTER — Ambulatory Visit: Payer: 59 | Admitting: Hematology and Oncology

## 2021-10-08 ENCOUNTER — Telehealth: Payer: Self-pay | Admitting: Hematology and Oncology

## 2021-10-08 ENCOUNTER — Inpatient Hospital Stay: Payer: 59 | Admitting: Hematology and Oncology

## 2021-10-08 NOTE — Telephone Encounter (Signed)
Contacted patient to scheduled appointments. Left message with appointment details and a call back number if patient had any questions or could not accommodate the time we provided.   

## 2021-10-15 ENCOUNTER — Ambulatory Visit: Payer: 59 | Admitting: Neurology

## 2021-10-24 ENCOUNTER — Ambulatory Visit (INDEPENDENT_AMBULATORY_CARE_PROVIDER_SITE_OTHER): Payer: 59

## 2021-10-24 ENCOUNTER — Ambulatory Visit
Admission: RE | Admit: 2021-10-24 | Discharge: 2021-10-24 | Disposition: A | Discharge status: Home / Self Care | Payer: 59 | Source: Ambulatory Visit | Attending: Physician Assistant | Admitting: Physician Assistant

## 2021-10-24 VITALS — BP 119/73 | HR 87 | Temp 97.8°F | Resp 18

## 2021-10-24 DIAGNOSIS — Z1152 Encounter for screening for COVID-19: Secondary | ICD-10-CM | POA: Insufficient documentation

## 2021-10-24 DIAGNOSIS — J209 Acute bronchitis, unspecified: Secondary | ICD-10-CM | POA: Insufficient documentation

## 2021-10-24 DIAGNOSIS — R059 Cough, unspecified: Secondary | ICD-10-CM

## 2021-10-24 MED ORDER — PROMETHAZINE-DM 6.25-15 MG/5ML PO SYRP: 5.0000 mL | ORAL_SOLUTION | Freq: Four times a day (QID) | ORAL | 0 refills | Status: DC | PRN

## 2021-10-24 MED ORDER — AZITHROMYCIN 250 MG PO TABS: 250.0000 mg | ORAL_TABLET | Freq: Every day | ORAL | 0 refills | Status: DC

## 2021-10-24 MED ORDER — PREDNISONE 20 MG PO TABS
40.0000 mg | ORAL_TABLET | Freq: Every day | ORAL | 0 refills | Status: AC
Start: 2021-10-24 — End: 2021-10-29

## 2021-10-24 NOTE — ED Triage Notes (Signed)
Pt presents to [facility] with co of cough, congestion, hoarseness, cp. Pt st she has been sick for 3 weeks but the last few days she seems to be getting worse. Pt is out of inhaler. Pt reports she was Covid neg but her daughter was positive.   Pt reports nyquill and dayquill for otc medications

## 2021-10-24 NOTE — ED Provider Notes (Signed)
[name]    CSN: [id number] Arrival date & time: 10/24/21  1759      History   Chief Complaint Chief Complaint  Patient presents with   Cough    Chest pain cough sputum. Sick for 3 weeks - Entered by patient    HPI Cindy Turner is a 36 y.o. female.   Patient here today for evaluation of cough congestion and chest discomfort that started about 3 weeks ago.  She reports initially she did have fever but none recently.  She states cough has been more concerning over the last 5 days.  She states she did take a COVID test that was negative however her daughter was positive.  She has been taking DayQuil and NyQuil without resolution of symptoms.  She denies any nausea or vomiting but has had diarrhea.  The history is provided by the patient.  Cough Associated symptoms: fever and sore throat   Associated symptoms: no chills and no eye discharge     Past Medical History:  Diagnosis Date   Allergy    Family history of melanoma    Fibromyalgia    Migraines    Raynaud's disease    Ureterolithiasis     Patient Active Problem List   Diagnosis Date Noted   History of nephrolithiasis 11/29/2017   Vaginal candidiasis 11/29/2017   RLQ abdominal pain 09/21/2017   Pap smear abnormality of cervix with ASCUS favoring benign 09/07/2017   Genetic testing 10/26/2016   Nausea and vomiting 10/19/2016   URI, acute 10/19/2016   Right otitis media 10/19/2016   Tremor 07/16/2016   Syncope and collapse 07/16/2016   Head injury 07/16/2016   Rib contusion, right, sequela 07/09/2016   Chronic migraine 04/22/2016   Chronic pelvic pain in female 01/17/2016   Endometriosis of pelvic peritoneum 01/17/2016   Hx of tubal ligation 01/17/2016   Vaginitis 01/17/2016   Dysuria 01/16/2016   Acute bilateral low back pain 01/16/2016   Painless hematuria 01/16/2016   Irregular periods 01/16/2016   Right shoulder injury- ortho 12/25/2015   Family history of diabetes mellitus  12/25/2015   OCD (obsessive compulsive disorder) 12/25/2015   Adjustment disorder with mixed anxiety and depressed mood 12/25/2015   Family history of breast cancer in first degree relative- mother age 79 12/25/2015   Family history of uterine cancer- Mom age 23 12/25/2015   Family history of malignant melanoma of skin- in 30's 12/25/2015   Common migraine without intractability 11/25/2015   Occipital neuralgia of right side 11/25/2015   Neck pain 11/25/2015   POTS (postural orthostatic tachycardia syndrome) 11/25/2015   Lightheadedness 11/25/2015   Insomnia 11/25/2015   Periodic limb movement disorder (PLMD) 11/25/2015   Right wrist injury and R shoulder- injured at work- ortho 09/11/2015   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 Authorizing Provider  azithromycin (ZITHROMAX) 250 MG tablet Take 1 tablet (250 mg total) by mouth daily. Take first 2 tablets together, then 1 every day until finished. 10/24/21  Yes Francene Finders, PA-C  predniSONE (DELTASONE) 20 MG tablet Take 2 tablets (40 mg total) by mouth daily with breakfast for 5 days. 10/24/21 10/29/21 Yes Francene Finders, PA-C  promethazine-dextromethorphan (PROMETHAZINE-DM) 6.25-15 MG/5ML syrup Take 5 mLs by mouth 4 (four) times daily as needed for cough. 10/24/21  Yes Francene Finders, PA-C  rizatriptan (MAXALT) 10 MG tablet Take 1 tablet (10 mg total)  by mouth as needed for migraine. May repeat in 2 hours if needed 05/15/21   Sater, Nanine Means, MD  topiramate (TOPAMAX) 25 MG tablet Take 75 mg by mouth at bedtime. 02/13/21   [provider]  venlafaxine XR (EFFEXOR-XR) 75 MG 24 hr capsule TAKE 1 CAPSULE BY MOUTH DAILY WITH BREAKFAST. 06/16/21   Sater, Nanine Means, MD    Family History Family History  Problem Relation Age of Onset   Hypertension Mother    Arthritis Mother    Breast cancer Mother 61   Uterine cancer Mother    Legg-Calve-Perthes disease Father    Healthy Brother    Melanoma Maternal Aunt        dx in her 4s   Breast cancer Maternal Grandmother        dx in her 36s   Throat cancer Maternal Grandfather        smoker; dx in 2s   Melanoma Paternal Grandfather    Hypertension Paternal Grandfather    Diabetes Paternal Grandfather    Heart attack Paternal Grandfather    Asthma Daughter    Healthy Daughter    Lung cancer Other     Social History Social History   Tobacco Use   Smoking status: Never   Smokeless tobacco: Never  Vaping Use   Vaping Use: Never used  Substance Use Topics   Alcohol use: No   Drug use: No     Allergies   Pineapple, Magnesium sulfate, Terbutaline sulfate, Vitamin a, and Latex   Review of Systems Review of Systems  Constitutional:  Positive for fever. Negative for chills.  HENT:  Positive for congestion and sore throat.   Eyes:  Negative for discharge and redness.  Respiratory:  Positive for cough.   Gastrointestinal:  Positive for diarrhea. Negative for nausea and vomiting.     Physical Exam Triage Vital Signs ED Triage Vitals  Enc Vitals Group     BP      Pulse      Resp      Temp      Temp src      SpO2      Weight      Height      Head Circumference      Peak Flow      Pain Score      Pain Loc      Pain Edu?      Excl. in [place]?    No data found.  Updated Vital Signs BP 119/73   Pulse 87   Temp 97.8 F (36.6 C)   Resp 18  LMP 10/24/2021   SpO2 97%       Physical Exam Vitals and nursing note reviewed.  Constitutional:      General: She is not in acute distress.    Appearance: Normal appearance. She is not ill-appearing.  HENT:     Head: Normocephalic and atraumatic.     Nose: Congestion present.     Mouth/Throat:     Mouth: Mucous membranes are moist.     Pharynx: No oropharyngeal exudate or posterior oropharyngeal erythema.  Eyes:     Conjunctiva/sclera: Conjunctivae normal.  Cardiovascular:     Rate and Rhythm: Normal rate and regular rhythm.     Heart sounds: Normal heart sounds. No murmur heard. Pulmonary:     Effort: Pulmonary effort is normal. No respiratory distress.     Breath sounds: Normal breath sounds. No wheezing, rhonchi or rales.  Skin:    General: Skin is warm and dry.  Neurological:     Mental Status: She is alert.  Psychiatric:        Mood and Affect: Mood normal.        Thought Content: Thought content normal.      UC Treatments / Results  Labs (all labs ordered are listed, but only abnormal results are displayed) Labs Reviewed  RESP PANEL BY RT-PCR (FLU A&B, COVID) [id number]    EKG   Radiology DG Chest 2 View  Result Date: 10/24/2021 CLINICAL DATA:  Cough and congestion for 3 weeks. EXAM: CHEST - 2 VIEW COMPARISON:  Chest radiograph dated 02/13/2017. FINDINGS: The heart size and mediastinal contours are within normal limits. Both lungs are clear. The visualized skeletal structures are unremarkable. IMPRESSION: No active cardiopulmonary disease. Electronically Signed   By: Zerita Boers M.D.   On: 10/24/2021 18:49    Procedures Procedures (including critical care time)  Medications Ordered in UC Medications - No data to display  Initial Impression / Assessment and Plan / UC Course  I have reviewed the triage vital signs and the nursing notes.  Pertinent labs & imaging results that were available during my care of the patient were reviewed by me and considered in my medical decision making  (see chart for details).    Chest x-ray ordered given duration of symptoms.  Negative for concerning findings.  Will screen for COVID and flu.  Suspect likely bronchitis given worsening cough.  Steroid burst prescribed along with Z-Pak and encouraged follow-up if symptoms do not improve or worsen in any way.  Final Clinical Impressions(s) / UC Diagnoses   Final diagnoses:  Acute bronchitis, unspecified organism  Encounter for screening for COVID-19   Discharge Instructions   None    ED Prescriptions     Medication Sig Dispense Auth. Provider   predniSONE (DELTASONE) 20 MG tablet Take 2 tablets (40 mg total) by mouth daily with breakfast for 5 days. 10 tablet Ewell Poe F, PA-C   azithromycin (ZITHROMAX) 250 MG tablet Take 1 tablet (250 mg total) by mouth daily. Take first 2 tablets together, then 1 every day until finished. 6 tablet Francene Finders, PA-C   promethazine-dextromethorphan (PROMETHAZINE-DM) 6.25-15 MG/5ML syrup Take 5 mLs by mouth 4 (four) times daily as needed for cough. 118 mL Francene Finders, PA-C      PDMP not reviewed this encounter.   Francene Finders, PA-C 10/24/21 1915

## 2021-10-25 LAB — RESP PANEL BY RT-PCR (FLU A&B, COVID) ARPGX2
Influenza A by PCR: NEGATIVE
Influenza B by PCR: NEGATIVE
SARS Coronavirus 2 by RT PCR: NEGATIVE

## 2021-11-03 ENCOUNTER — Ambulatory Visit
Admission: EM | Admit: 2021-11-03 | Discharge: 2021-11-03 | Disposition: A | Discharge status: Home / Self Care | Payer: 59 | Attending: Physician Assistant | Admitting: Physician Assistant

## 2021-11-03 DIAGNOSIS — R519 Headache, unspecified: Secondary | ICD-10-CM | POA: Diagnosis not present

## 2021-11-03 DIAGNOSIS — R21 Rash and other nonspecific skin eruption: Secondary | ICD-10-CM

## 2021-11-03 DIAGNOSIS — M542 Cervicalgia: Secondary | ICD-10-CM

## 2021-11-03 MED ORDER — KETOROLAC TROMETHAMINE 30 MG/ML IJ SOLN
30.0000 mg | Freq: Once | INTRAMUSCULAR | Status: AC
  Administered 2021-11-03: 30 mg via INTRAMUSCULAR

## 2021-11-03 MED ORDER — CYCLOBENZAPRINE HCL 10 MG PO TABS: 10.0000 mg | ORAL_TABLET | Freq: Two times a day (BID) | ORAL | 0 refills | Status: DC | PRN

## 2021-11-03 NOTE — ED Triage Notes (Signed)
Pt c/o   1) migraine onset last night that is different than her norm. States it started in the occipital area and shoots down her spine to her lumbar area. Associated night sweats, nausea, body aches. Recent dx w/ bronchitis. Dull pain that shocks when moving.   2) rash to upper chest first noticed this morning. Reports it is pruritic.

## 2021-11-03 NOTE — ED Provider Notes (Signed)
[name]    CSN: [id number] Arrival date & time: 11/03/21  1813      History   Chief Complaint Chief Complaint  Patient presents with   rash and headache    HPI Cindy Turner is a 36 y.o. female.   Patient here today for evaluation of posterior headache that radiates down her neck that started yesterday. She reports that this is different from typical migraines she has. She notes that movement seems to worsen pain and she is somewhat limited in flexion of neck due to pain. She has tried massage without significant improvement.   She also reports a rash to her upper chest that she first noticed today. Rash is itchy. She does not have rash elsewhere.   The history is provided by the patient.    Past Medical History:  Diagnosis Date   Allergy    Family history of melanoma    Fibromyalgia    Migraines    Raynaud's disease    Ureterolithiasis     Patient Active Problem List   Diagnosis Date Noted   History of nephrolithiasis 11/29/2017   Vaginal candidiasis 11/29/2017   RLQ abdominal pain 09/21/2017   Pap smear abnormality of cervix with ASCUS favoring benign 09/07/2017   Genetic testing 10/26/2016   Nausea and vomiting 10/19/2016   URI, acute 10/19/2016   Right otitis media 10/19/2016   Tremor 07/16/2016   Syncope and collapse 07/16/2016   Head injury 07/16/2016   Rib contusion, right, sequela 07/09/2016   Chronic migraine 04/22/2016   Chronic pelvic pain in female 01/17/2016   Endometriosis of pelvic peritoneum 01/17/2016   Hx of tubal ligation 01/17/2016   Vaginitis 01/17/2016   Dysuria 01/16/2016   Acute bilateral low back pain 01/16/2016   Painless hematuria 01/16/2016   Irregular periods 01/16/2016   Right shoulder injury- ortho 12/25/2015   Family history of diabetes mellitus 12/25/2015   OCD (obsessive compulsive disorder) 12/25/2015   Adjustment disorder with mixed anxiety and depressed mood 12/25/2015   Family history of  breast cancer in first degree relative- mother age 30 12/25/2015   Family history of uterine cancer- Mom age 32 12/25/2015   Family history of malignant melanoma of skin- in 30's 12/25/2015   Common migraine without intractability 11/25/2015   Occipital neuralgia of right side 11/25/2015   Neck pain 11/25/2015   POTS (postural orthostatic tachycardia syndrome) 11/25/2015   Lightheadedness 11/25/2015   Insomnia 11/25/2015   Periodic limb movement disorder (PLMD) 11/25/2015   Right wrist injury and R shoulder- injured at work- ortho 09/11/2015   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 Authorizing Provider  cyclobenzaprine (FLEXERIL) 10 MG tablet Take 1 tablet (10 mg total) by mouth 2 (two) times daily as needed for muscle spasms. 11/03/21  Yes Francene Finders, PA-C  azithromycin (ZITHROMAX) 250 MG tablet Take 1 tablet (250 mg total) by mouth daily. Take first 2 tablets together, then 1 every day until finished. 10/24/21   Francene Finders, PA-C  promethazine-dextromethorphan (PROMETHAZINE-DM) 6.25-15 MG/5ML syrup Take 5 mLs by mouth 4 (four) times daily as needed for cough. 10/24/21   Francene Finders, PA-C  rizatriptan (MAXALT) 10 MG tablet Take 1 tablet (10 mg total) by mouth as needed for migraine. May repeat in 2 hours if needed 05/15/21   Sater, Nanine Means, MD  topiramate (TOPAMAX) 25 MG tablet Take 75 mg by  mouth at bedtime. 02/13/21   [provider]  venlafaxine XR (EFFEXOR-XR) 75 MG 24 hr capsule TAKE 1 CAPSULE BY MOUTH DAILY WITH BREAKFAST. 06/16/21   Sater, Nanine Means, MD    Family History Family History  Problem Relation Age of Onset   Hypertension Mother    Arthritis Mother    Breast cancer Mother 38   Uterine cancer Mother    Legg-Calve-Perthes disease Father    Healthy Brother    Melanoma Maternal Aunt        dx in her 35s   Breast cancer Maternal Grandmother        dx in her 62s   Throat cancer Maternal Grandfather        smoker; dx in 52s   Melanoma Paternal Grandfather    Hypertension Paternal Grandfather    Diabetes Paternal Grandfather    Heart attack Paternal Grandfather    Asthma Daughter    Healthy Daughter    Lung cancer Other     Social History Social History   Tobacco Use   Smoking status: Never   Smokeless tobacco: Never  Vaping Use   Vaping Use: Never used  Substance Use Topics   Alcohol use: No   Drug use: No     Allergies   Pineapple, Magnesium sulfate, Terbutaline sulfate, Vitamin a, and Latex   Review of Systems Review of Systems  Constitutional:  Negative for chills and fever.  Eyes:  Negative for discharge and redness.  Respiratory:  Negative for shortness of breath.   Gastrointestinal:  Negative for nausea and vomiting.  Skin:  Positive for rash.  Neurological:  Positive for headaches.     Physical Exam Triage Vital Signs ED Triage Vitals  Enc Vitals Group     BP 11/03/21 1915 123/82     Pulse Rate 11/03/21 1915 64     Resp 11/03/21 1915 20     Temp 11/03/21 1915 98 F (36.7 C)     Temp Source 11/03/21 1915 Oral     SpO2 11/03/21 1915 99 %     Weight --      Height --      Head Circumference --      Peak Flow --      Pain Score 11/03/21 1918 4     Pain Loc --      Pain Edu? --      Excl. in [place]? --    No data found.  Updated Vital Signs BP 123/82 (BP Location: Left Arm)   Pulse 64   Temp 98 F (36.7 C)  (Oral)   Resp  20   LMP 10/24/2021   SpO2 99%      Physical Exam Vitals and nursing note reviewed.  Constitutional:      General: She is not in acute distress.    Appearance: Normal appearance. She is not ill-appearing.  HENT:     Head: Normocephalic and atraumatic.     Nose: Nose normal. No congestion or rhinorrhea.  Eyes:     Conjunctiva/sclera: Conjunctivae normal.  Cardiovascular:     Rate and Rhythm: Normal rate.  Pulmonary:     Effort: Pulmonary effort is normal. No respiratory distress.  Musculoskeletal:     Comments: Mildly decreased flexion of neck due to pain with same  Skin:    Comments: Mildly erythematous papular eruption to upper chest  Neurological:     Mental Status: She is alert.  Psychiatric:        Mood and Affect: Mood normal.        Behavior: Behavior normal.      UC Treatments / Results  Labs (all labs ordered are listed, but only abnormal results are displayed) Labs Reviewed - No data to display  EKG   Radiology No results found.  Procedures Procedures (including critical care time)  Medications Ordered in UC Medications  ketorolac (TORADOL) 30 MG/ML injection 30 mg (30 mg Intramuscular Given 11/03/21 1942)    Initial Impression / Assessment and Plan / UC Course  I have reviewed the triage vital signs and the nursing notes.  Pertinent labs & imaging results that were available during my care of the patient were reviewed by me and considered in my medical decision making (see chart for details).    Discussed mild concern for possible meningitis given pain in neck/ headache however given lack of fever lower suspicion for same. Will trial muscle relaxer and toradol injection in office with strict precaution to report to ED with any worsening pain, fever, stiffness, etc. Patient expresses understanding. Rash also seemingly benign- recommended topical OTC treatment if needed. Encouraged follow up with any further concerns.   Final Clinical  Impressions(s) / UC Diagnoses   Final diagnoses:  Acute nonintractable headache, unspecified headache type  Neck pain  Rash and nonspecific skin eruption   Discharge Instructions   None    ED Prescriptions     Medication Sig Dispense Auth. Provider   cyclobenzaprine (FLEXERIL) 10 MG tablet Take 1 tablet (10 mg total) by mouth 2 (two) times daily as needed for muscle spasms. 20 tablet Francene Finders, PA-C      PDMP not reviewed this encounter.   Francene Finders, PA-C 11/03/21 1951

## 2021-11-05 ENCOUNTER — Ambulatory Visit
Admission: EM | Admit: 2021-11-05 | Discharge: 2021-11-05 | Disposition: A | Discharge status: Home / Self Care | Payer: 59 | Attending: Internal Medicine | Admitting: Internal Medicine

## 2021-11-05 DIAGNOSIS — R21 Rash and other nonspecific skin eruption: Secondary | ICD-10-CM

## 2021-11-05 MED ORDER — METHYLPREDNISOLONE SODIUM SUCC 125 MG IJ SOLR
80.0000 mg | Freq: Once | INTRAMUSCULAR | Status: AC
  Administered 2021-11-05: 80 mg via INTRAMUSCULAR

## 2021-11-05 MED ORDER — FAMOTIDINE 20 MG PO TABS: 20.0000 mg | ORAL_TABLET | Freq: Two times a day (BID) | ORAL | 0 refills | Status: DC

## 2021-11-05 MED ORDER — DOXYCYCLINE HYCLATE 100 MG PO CAPS: 100.0000 mg | ORAL_CAPSULE | Freq: Two times a day (BID) | ORAL | 0 refills | Status: DC

## 2021-11-05 NOTE — ED Triage Notes (Signed)
Pt returns for rash. Was seen a few days ago and given tx. Since then rash has spread from chest/neck area to face. Reports it is pruritic and painful to touch.

## 2021-11-05 NOTE — ED Provider Notes (Signed)
[name]    CSN: [id number] Arrival date & time: 11/05/21  0802      History   Chief Complaint Chief Complaint  Patient presents with   Rash    HPI Cindy Turner is a 36 y.o. female.   Patient presents with itchy rash that is worsening per patient.  Patient was seen on 11/03/2021 for rash and was advised over-the-counter treatment.  She states that she has been using hydrocortisone cream and taking Benadryl with no improvement.  Rash is starting to spread and is currently on right cheek of face.  Denies any associated fevers with the rash.  Denies any changes to lotions, soaps, detergents, foods, etc.  Denies any known sick contacts.   Rash   Past Medical History:  Diagnosis Date   Allergy    Family history of melanoma    Fibromyalgia    Migraines    Raynaud's disease    Ureterolithiasis     Patient Active Problem List   Diagnosis Date Noted   History of nephrolithiasis 11/29/2017   Vaginal candidiasis 11/29/2017   RLQ abdominal pain 09/21/2017   Pap smear abnormality of cervix with ASCUS favoring benign 09/07/2017   Genetic testing 10/26/2016   Nausea and vomiting 10/19/2016   URI, acute 10/19/2016   Right otitis media 10/19/2016   Tremor 07/16/2016   Syncope and collapse 07/16/2016   Head injury 07/16/2016   Rib contusion, right, sequela 07/09/2016   Chronic migraine 04/22/2016   Chronic pelvic pain in female 01/17/2016   Endometriosis of pelvic peritoneum 01/17/2016   Hx of tubal ligation 01/17/2016   Vaginitis 01/17/2016   Dysuria 01/16/2016   Acute bilateral low back pain 01/16/2016   Painless hematuria 01/16/2016   Irregular periods 01/16/2016   Right shoulder injury- ortho 12/25/2015   Family history of diabetes mellitus 12/25/2015   OCD (obsessive compulsive disorder) 12/25/2015   Adjustment disorder with mixed anxiety and depressed mood 12/25/2015   Family history of breast cancer in first degree relative- mother age 67  12/25/2015   Family history of uterine cancer- Mom age 18 12/25/2015   Family history of malignant melanoma of skin- in 30's 12/25/2015   Common migraine without intractability 11/25/2015   Occipital neuralgia of right side 11/25/2015   Neck pain 11/25/2015   POTS (postural orthostatic tachycardia syndrome) 11/25/2015   Lightheadedness 11/25/2015   Insomnia 11/25/2015   Periodic limb movement disorder (PLMD) 11/25/2015   Right wrist injury and R shoulder- injured at work- ortho 09/11/2015   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 Authorizing Provider  doxycycline (VIBRAMYCIN) 100 MG capsule Take 1 capsule (100 mg total) by mouth 2 (two) times daily. 11/05/21  Yes Miken Stecher, Michele Rockers, FNP  famotidine (PEPCID) 20 MG tablet Take 1 tablet (20 mg total) by mouth 2 (two) times daily. 11/05/21  Yes Anival Pasha, Hildred Alamin E, FNP  azithromycin (ZITHROMAX) 250 MG tablet Take 1 tablet (250 mg total) by mouth daily. Take first 2 tablets together, then 1 every day until finished. 10/24/21   Francene Finders, PA-C  cyclobenzaprine (FLEXERIL) 10 MG tablet Take 1 tablet (10 mg total) by mouth 2 (two) times daily as needed for muscle spasms. 11/03/21   Francene Finders, PA-C  promethazine-dextromethorphan (PROMETHAZINE-DM) 6.25-15 MG/5ML syrup Take 5 mLs by mouth 4 (four) times daily as needed for cough. 10/24/21   Francene Finders, PA-C   rizatriptan (MAXALT) 10 MG tablet Take 1 tablet (10 mg total) by mouth as needed for migraine. May repeat in 2 hours if needed 05/15/21   Sater, Nanine Means, MD  topiramate (TOPAMAX) 25 MG tablet Take 75 mg by mouth at bedtime. 02/13/21   [provider]  venlafaxine XR (EFFEXOR-XR) 75 MG 24 hr capsule TAKE 1 CAPSULE BY MOUTH DAILY WITH BREAKFAST. 06/16/21   Sater, Nanine Means, MD    Family History Family History  Problem Relation Age of Onset   Hypertension Mother    Arthritis Mother    Breast cancer Mother 42   Uterine cancer Mother    Legg-Calve-Perthes disease Father    Healthy Brother    Melanoma Maternal Aunt        dx in her 45s   Breast cancer Maternal Grandmother        dx in her 65s   Throat cancer Maternal Grandfather        smoker; dx in 53s   Melanoma Paternal Grandfather    Hypertension Paternal Grandfather    Diabetes Paternal Grandfather    Heart attack Paternal Grandfather    Asthma Daughter    Healthy Daughter    Lung cancer Other     Social History Social History   Tobacco Use   Smoking status: Never   Smokeless tobacco: Never  Vaping Use   Vaping Use: Never used  Substance Use Topics   Alcohol use: No   Drug use: No     Allergies   Pineapple, Magnesium sulfate, Terbutaline sulfate, Vitamin a, and Latex   Review of Systems Review of Systems Per HPI  Physical Exam Triage Vital Signs ED Triage Vitals [11/05/21 0824]  Enc Vitals Group     BP (!) 142/79     Pulse Rate 74     Resp 16     Temp 98.1 F (36.7 C)     Temp Source Oral     SpO2 99 %     Weight      Height      Head Circumference      Peak Flow      Pain Score 6     Pain Loc      Pain Edu?      Excl. in [place]?    No data found.  Updated Vital Signs BP (!) 142/79 (BP Location: Left Arm)   Pulse 74   Temp 98.1 F (36.7 C) (Oral)   Resp 16   LMP 10/24/2021   SpO2 99%   Visual Acuity Right Eye Distance:   Left Eye Distance:   Bilateral Distance:  Right Eye  Near:   Left Eye Near:    Bilateral Near:     Physical Exam Constitutional:      General: She is not in acute distress.    Appearance: Normal appearance. She is not toxic-appearing or diaphoretic.  HENT:     Head: Normocephalic and atraumatic.  Eyes:     Extraocular Movements: Extraocular movements intact.     Conjunctiva/sclera: Conjunctivae normal.  Pulmonary:     Effort: Pulmonary effort is normal.  Skin:    Comments: Erythematous papular rash present throughout upper chest and one patch on cheek of right face.  Several of the areas of the rash appear mildly pustular.  Neurological:     General: No focal deficit present.     Mental Status: She is alert and oriented to person, place, and time. Mental status is at baseline.  Psychiatric:        Mood and Affect: Mood normal.        Behavior: Behavior normal.        Thought Content: Thought content normal.        Judgment: Judgment normal.      UC Treatments / Results  Labs (all labs ordered are listed, but only abnormal results are displayed) Labs Reviewed - No data to display  EKG   Radiology No results found.  Procedures Procedures (including critical care time)  Medications Ordered in UC Medications  methylPREDNISolone sodium succinate (SOLU-MEDROL) 125 mg/2 mL injection 80 mg (has no administration in time range)    Initial Impression / Assessment and Plan / UC Course  I have reviewed the triage vital signs and the nursing notes.  Pertinent labs & imaging results that were available during my care of the patient were reviewed by me and considered in my medical decision making (see chart for details).     Differential diagnoses for rash are contact dermatitis versus acne versus folliculitis.  will treat for both with IM steroid injection here in urgent care as well as doxycycline sent to cover for infection/follicular acne.  Patient also advised to continue antihistamines as needed and will prescribe Pepcid.   She states that she does not take any daily medications or have any chronic health problems other than migraines so this should be safe.  Patient was seen on 11/03/2021 and also had neck pain, so the provider that she saw discussed concern for meningitis.  Patient reports that symptoms of neck pain have improved and I do have a low concern for meningitis rash as it is not purpuric or petechiae in nature.  Although, advised strict follow-up and ER precautions.  Patient verbalized understanding and was agreeable with plan. Final Clinical Impressions(s) / UC Diagnoses   Final diagnoses:  Rash and nonspecific skin eruption     Discharge Instructions      Your rash appears to be allergic in nature.  I am giving you a steroid injection today in urgent care and have prescribed you Pepcid as well.  I have also prescribed doxycycline given concern for infection of the skin versus acne as well as we discussed.  Please take this antibiotic with food and wear sunscreen if you are going to be out in the sun as it can make you more prone to sunburn.  Please follow-up if any symptoms persist or worsen.    ED Prescriptions     Medication Sig Dispense Auth. Provider   doxycycline (VIBRAMYCIN) 100 MG capsule Take 1 capsule (100 mg total) by mouth  2 (two) times daily. 20 capsule [place], [address], [place]   famotidine (PEPCID) 20 MG tablet Take 1 tablet (20 mg total) by mouth 2 (two) times daily. 30 tablet [place], Michele Rockers, [place]      PDMP not reviewed this encounter.   Teodora Medici, [place] 11/05/21 [phone]

## 2021-11-05 NOTE — Discharge Instructions (Signed)
Your rash appears to be allergic in nature.  I am giving you a steroid injection today in urgent care and have prescribed you Pepcid as well.  I have also prescribed doxycycline given concern for infection of the skin versus acne as well as we discussed.  Please take this antibiotic with food and wear sunscreen if you are going to be out in the sun as it can make you more prone to sunburn.  Please follow-up if any symptoms persist or worsen.

## 2021-11-11 ENCOUNTER — Ambulatory Visit: Payer: 59 | Admitting: Neurology

## 2021-11-11 ENCOUNTER — Ambulatory Visit: Payer: 59 | Admitting: Hematology and Oncology

## 2021-11-11 ENCOUNTER — Telehealth: Payer: Self-pay | Admitting: Neurology

## 2021-11-11 ENCOUNTER — Encounter: Payer: Self-pay | Admitting: Neurology

## 2021-11-11 VITALS — BP 116/80 | HR 77 | Ht 67.0 in | Wt 151.8 lb

## 2021-11-11 DIAGNOSIS — G43009 Migraine without aura, not intractable, without status migrainosus: Secondary | ICD-10-CM | POA: Diagnosis not present

## 2021-11-11 DIAGNOSIS — G43109 Migraine with aura, not intractable, without status migrainosus: Secondary | ICD-10-CM | POA: Diagnosis not present

## 2021-11-11 DIAGNOSIS — M5481 Occipital neuralgia: Secondary | ICD-10-CM | POA: Diagnosis not present

## 2021-11-11 MED ORDER — KETOROLAC TROMETHAMINE 30 MG/ML IJ SOLN: INTRAMUSCULAR | 11 refills | Status: DC

## 2021-11-11 MED ORDER — LEVETIRACETAM 750 MG PO TABS: 750.0000 mg | ORAL_TABLET | Freq: Two times a day (BID) | ORAL | 11 refills | Status: DC

## 2021-11-11 MED ORDER — SUMATRIPTAN SUCCINATE 100 MG PO TABS: 100.0000 mg | ORAL_TABLET | Freq: Once | ORAL | 5 refills | Status: DC | PRN

## 2021-11-11 NOTE — Telephone Encounter (Signed)
Cindy Turner is calling from [facility]. Stated she need to confirm medication with provider because it normally not used in a out patient setting.

## 2021-11-11 NOTE — Progress Notes (Signed)
[facility]  PATIENT: Cindy Turner DOB: 10-Apr-1985  REFERRING DOCTOR OR PCP: Gerlene Fee, MD, Lincoln Brigham, MD SOURCE: Patient, previous notes from [facility] neurology, imaging and lab reports, MRI images personally reviewed.  _________________________________   HISTORICAL  CHIEF COMPLAINT:  Chief Complaint  Patient presents with   Follow-up    Pt in room #2 and alone. Pt here today for f/u on migraine.    HISTORY OF PRESENT ILLNESS:  Cindy Turner, and [facility] for neurologic consultation regarding her    Update 11/11/2021: Her migraines are occurring 2-3 a week.   She often feels sluggish the day or two before the migraine and feels weak with some stumbling the day or a few hours before the migraine.     She then gets facial numbness and the migraine headache.   BP is often low before the migraine and increased during the migraine.     She has migraines twice a week, wmany with debilitating pain (20/30 days a month).  Some last several days.   She has pounding right frontal pain, nausea/vomiting, photophobia/phonophobia.  Moving worsens the pain.       Medications tried: Emgality did not help.   Topiramate did not help.   Venlafaxine did not help and was poorly tolerated.  Rizatriptan did not help.      A combination of Benadryl , compazine, decadron sometimes has helped.       Complicated migraine history: She is a 36 year old woman who had episodes of presyncope, left facial numbness, left >>right hand numbness over the past few years.   Her 4th episode, 02/11/2021, occurred at work, she had the left sided numbness, her tongue felt thick and she had some trouble gettting words out and her gait was off balanced with some left leg weakness.  She went to the [facility] and was transferred to [facility].   She had a severe right pounding headache.  She was evaluated and had MRI of the brain and cervical spine.  The  brain showed a couple punctate T2/FLAIR hyperintense foci in the subcortical white matter.  This is nonspecific and could be related to migraines but also could be due to cardio emboli.  The cervical spine showed a minimal disc bulge at C5-C6 with no nerve root compression or spinal stenosis.  She has a strong FH of migraines:  Mother, brother and maternal side aunt.   Her brother and aunt also have similar neurologic symptoms c/w complicated migraine.   Her 42 and 43 year old daughter have migraine and the 66 year old has syncope associated with her migraines as well as visual changes and poor balance.     I also saw her as a patient in 2018.  At that time, she had had multiple episodes of syncope and presyncope.  She has a cardiac work-up as part of a POTS evaluation in 2018.   Her pulse is generally high.   EKG earlier this year was sinus rhythm with short PR and incomplete RBBB and left posterior fascicular block.       Imaging (personally reviewed) MRI of the head and cervical spine 03/03/2021: There are a couple punctate T2/FLAIR hyperintense foci in the subcortical white matter of the frontal lobes.  The brain is otherwise normal.  Cervical spine is normal with just a very minimal bulge at C5-C6.  REVIEW OF SYSTEMS: Constitutional: No fevers, chills, sweats, or change in appetite Eyes: No visual changes, double  vision, eye pain Ear, nose and throat: No hearing loss, ear pain, nasal congestion, sore throat Cardiovascular: No chest pain, palpitations.  Abnormal EKG. Respiratory:  No shortness of breath at rest or with exertion.   No wheezes GastrointestinaI: No nausea, vomiting, diarrhea, abdominal pain, fecal incontinence Genitourinary:  No dysuria, urinary retention or frequency.  No nocturia. Musculoskeletal:  No neck pain, back pain Integumentary: No rash, pruritus, skin lesions Neurological: as above Psychiatric: No depression at this time.  No anxiety Endocrine: No palpitations,  diaphoresis, change in appetite, change in weigh or increased thirst Hematologic/Lymphatic:  No anemia, purpura, petechiae. Allergic/Immunologic: No itchy/runny eyes, nasal congestion, recent allergic reactions, rashes  ALLERGIES: Allergies  Allergen Reactions   Pineapple Anaphylaxis, Itching and Rash   Magnesium Sulfate     Other reaction(s): Bradycardia, Confusion, Decreased Blood Pressure, Irregular Heart Rate, Other (See Comments) HIGH HEART RATE HIGH HEART RATE    Terbutaline Sulfate Other (See Comments)    Arrhythmia   Vitamin A Other (See Comments)    Headaches    Latex Rash    HOME MEDICATIONS:  Current Outpatient Medications:    cyclobenzaprine (FLEXERIL) 10 MG tablet, Take 1 tablet (10 mg total) by mouth 2 (two) times daily as needed for muscle spasms., Disp: 20 tablet, Rfl: 0   doxycycline (VIBRAMYCIN) 100 MG capsule, Take 1 capsule (100 mg total) by mouth 2 (two) times daily., Disp: 20 capsule, Rfl: 0   ketorolac (TORADOL) 30 MG/ML injection, Inject 1 mL intramuscular prn migraine.  Dispense with #8 one cc syringes and 1 inch 25 g needle.   No more than 2 in one day, Disp: 8 mL, Rfl: 11   levETIRAcetam (KEPPRA) 750 MG tablet, Take 1 tablet (750 mg total) by mouth 2 (two) times daily., Disp: 60 tablet, Rfl: 11   SUMAtriptan (IMITREX) 100 MG tablet, Take 1 tablet (100 mg total) by mouth once as needed for up to 1 dose for migraine. May repeat in 2 hours if headache persists or recurs., Disp: 10 tablet, Rfl: 5  PAST MEDICAL HISTORY: Past Medical History:  Diagnosis Date   Allergy    Family history of melanoma    Fibromyalgia    Migraines    Raynaud's disease    Ureterolithiasis     PAST SURGICAL HISTORY: Past Surgical History:  Procedure Laterality Date   APPENDECTOMY     BREAST BIOPSY Left    DILATION AND CURETTAGE OF UTERUS     kidney stent     LAPAROSCOPY     TONSILLECTOMY     TUBAL LIGATION     TUBAL LIGATION      FAMILY HISTORY: Family History   Problem Relation Age of Onset   Hypertension Mother    Arthritis Mother    Breast cancer Mother 66   Uterine cancer Mother    Legg-Calve-Perthes disease Father    Healthy Brother    Melanoma Maternal Aunt        dx in her 73s   Breast cancer Maternal Grandmother        dx in her 58s   Throat cancer Maternal Grandfather        smoker; dx in 41s   Melanoma Paternal Grandfather    Hypertension Paternal Grandfather    Diabetes Paternal Grandfather    Heart attack Paternal Grandfather    Asthma Daughter    Healthy Daughter    Lung cancer Other     SOCIAL HISTORY:  Social History   Socioeconomic History  Marital status: Married    Spouse name: Mitzi Hansen   Number of children: 2   Years of education: Not on file   Highest education level: Some college, no degree  Occupational History   Not on file  Tobacco Use   Smoking status: Never   Smokeless tobacco: Never  Vaping Use   Vaping Use: Never used  Substance and Sexual Activity   Alcohol use: No   Drug use: No   Sexual activity: Not on file    Comment: tubal ligation  Other Topics Concern   Not on file  Social History Narrative   Lives at home with husband and children   Right handed   Caffeine: 1 C of coffee a day, sometimes 2. Zero sugar dr pepper 1x week.   Social Determinants of Health   Financial Resource Strain: Not on file  Food Insecurity: Not on file  Transportation Needs: Not on file  Physical Activity: Not on file  Stress: Not on file  Social Connections: Not on file  Intimate Partner Violence: Not on file     PHYSICAL EXAM  Vitals:   11/11/21 1545  BP: 116/80  Pulse: 77  Weight: 151 lb 12.8 oz (68.9 kg)  Height: '5\' 7"'$  (1.702 m)    Body mass index is 23.78 kg/m.   General: The patient is well-developed and well-nourished and in no acute distress  HEENT:  Head is [place]/AT.  Sclera are anicteric.  Skin: Extremities are without rash or  edema.  Musculoskeletal:  Back is  nontender  Neurologic Exam  Mental status: The patient is alert and oriented x 3 at the time of the examination. The patient has apparent normal recent and remote memory, with an apparently normal attention span and concentration ability.   Speech is normal.  Cranial nerves: Extraocular movements are full. Pupils are equal, round, and reactive to light and accomodation. .  Facial symmetry is present. There is good facial sensation to soft touch bilaterally.Facial strength is normal.  Trapezius and sternocleidomastoid strength is normal. No dysarthria is noted.   Motor:  Muscle bulk is normal.   Tone is normal. Strength is  5 / 5 in all 4 extremities.   Sensory: Sensory testing is intact to pinprick, soft touch and vibration sensation in all 4 extremities except slight reduced vibration in right toes.     Coordination: Cerebellar testing reveals good finger-nose-finger and heel-to-shin bilaterally.  Gait and station: Station is normal.   Gait is normal. Tandem gait is normal. Romberg is negative.   Reflexes: Deep tendon reflexes are symmetric and normal bilaterally.        DIAGNOSTIC DATA (LABS, IMAGING, TESTING) - I reviewed patient records, labs, notes, testing and imaging myself where available.  Lab Results  Component Value Date   WBC 5.1 03/03/2021   HGB 13.3 03/03/2021   HCT 43.0 03/03/2021   MCV 76.4 (L) 03/03/2021   PLT 260 03/03/2021      Component Value Date/Time   NA 137 03/03/2021 1449   NA 139 03/04/2017 1518   K 4.0 03/03/2021 1449   CL 105 03/03/2021 1449   CO2 22 03/03/2021 1449   GLUCOSE 90 03/03/2021 1449   BUN 11 03/03/2021 1449   BUN 10 03/04/2017 1518   CREATININE 0.80 03/03/2021 1449   CALCIUM 9.6 03/03/2021 1449   PROT 7.8 03/03/2021 1449   PROT 7.1 03/04/2017 1518   ALBUMIN 4.8 03/03/2021 1449   ALBUMIN 4.7 03/04/2017 1518   AST 11 (L) 03/03/2021  1449   ALT 9 03/03/2021 1449   ALKPHOS 39 03/03/2021 1449   BILITOT 0.5 03/03/2021 1449   BILITOT  0.8 03/04/2017 1518   GFRNONAA >60 03/03/2021 1449   GFRAA >60 12/31/2017 1138   Lab Results  Component Value Date   Turner 125 07/09/2016   HDL 60 07/09/2016   LDLCALC 56 07/09/2016   TRIG 44 07/09/2016   CHOLHDL 2.1 07/09/2016   Lab Results  Component Value Date   HGBA1C 5.2 07/09/2016   No results found for: "VITAMINB12" Lab Results  Component Value Date   TSH 0.802 07/09/2016       ASSESSMENT AND PLAN  Complicated migraine  Common migraine without intractability  Occipital neuralgia of right side   Keppra for migraine prophylaxis.     Can take compazine/benadryl/decadron or toradol 30 mg IM or sumatriptan when migraine occurs.    Rtc 6 months or sooner if new or worsening neurologic issue   Kymberlee Viger A. Felecia Shelling, MD, [facility] 91/08/9148, 5:69 PM Certified in Neurology, Clinical Neurophysiology, Sleep Medicine and Neuroimaging  [facility] [address], [place], [place] [address] [phone]

## 2021-11-11 NOTE — Telephone Encounter (Signed)
Called [facility] back. Verified pt has received toradol IM in the past. She verbalized understanding and will process rx, nothing further needed. Just had to confirm she has had before

## 2021-11-19 ENCOUNTER — Other Ambulatory Visit: Payer: Self-pay | Admitting: Obstetrics & Gynecology

## 2021-11-19 DIAGNOSIS — R52 Pain, unspecified: Secondary | ICD-10-CM

## 2021-11-28 ENCOUNTER — Ambulatory Visit
Admission: RE | Admit: 2021-11-28 | Discharge: 2021-11-28 | Disposition: A | Discharge status: Home / Self Care | Payer: 59 | Source: Ambulatory Visit | Attending: Obstetrics & Gynecology | Admitting: Obstetrics & Gynecology

## 2021-11-28 ENCOUNTER — Ambulatory Visit: Payer: 59

## 2021-11-28 ENCOUNTER — Inpatient Hospital Stay: Payer: 59 | Admitting: Hematology and Oncology

## 2021-11-28 DIAGNOSIS — R52 Pain, unspecified: Secondary | ICD-10-CM

## 2022-01-13 ENCOUNTER — Other Ambulatory Visit: Payer: Self-pay | Admitting: Obstetrics & Gynecology

## 2022-01-13 DIAGNOSIS — Z9189 Other specified personal risk factors, not elsewhere classified: Secondary | ICD-10-CM

## 2022-03-09 ENCOUNTER — Other Ambulatory Visit: Payer: 59

## 2022-04-13 ENCOUNTER — Other Ambulatory Visit: Payer: 59

## 2022-05-19 ENCOUNTER — Ambulatory Visit: Payer: 59 | Admitting: Neurology

## 2022-06-16 ENCOUNTER — Telehealth: Payer: Self-pay | Admitting: Neurology

## 2022-06-16 ENCOUNTER — Ambulatory Visit: Payer: 59 | Admitting: Neurology

## 2022-06-16 NOTE — Telephone Encounter (Signed)
Pt called. Stated she forgot about appointment with Dr. Epimenio Foot for this morning.

## 2022-10-07 ENCOUNTER — Telehealth: Payer: Self-pay | Admitting: Neurology

## 2022-10-07 NOTE — Telephone Encounter (Signed)
LVM and sent mychart msg informing pt of appt time change - MD out

## 2022-10-20 ENCOUNTER — Ambulatory Visit: Payer: 59 | Admitting: Neurology

## 2022-10-20 ENCOUNTER — Telehealth: Payer: Self-pay | Admitting: *Deleted

## 2022-10-20 ENCOUNTER — Other Ambulatory Visit (HOSPITAL_COMMUNITY): Payer: Self-pay

## 2022-10-20 ENCOUNTER — Encounter: Payer: Self-pay | Admitting: Neurology

## 2022-10-20 ENCOUNTER — Telehealth: Payer: Self-pay

## 2022-10-20 ENCOUNTER — Other Ambulatory Visit: Payer: Self-pay | Admitting: Neurology

## 2022-10-20 VITALS — BP 114/76 | HR 59 | Ht 67.0 in | Wt 157.0 lb

## 2022-10-20 DIAGNOSIS — M542 Cervicalgia: Secondary | ICD-10-CM

## 2022-10-20 DIAGNOSIS — G43109 Migraine with aura, not intractable, without status migrainosus: Secondary | ICD-10-CM

## 2022-10-20 DIAGNOSIS — M5481 Occipital neuralgia: Secondary | ICD-10-CM | POA: Diagnosis not present

## 2022-10-20 DIAGNOSIS — R200 Anesthesia of skin: Secondary | ICD-10-CM

## 2022-10-20 DIAGNOSIS — R9082 White matter disease, unspecified: Secondary | ICD-10-CM

## 2022-10-20 MED ORDER — KETOROLAC TROMETHAMINE 30 MG/ML IJ SOLN: INTRAMUSCULAR | 11 refills | Status: AC

## 2022-10-20 MED ORDER — LEVETIRACETAM 750 MG PO TABS: ORAL_TABLET | ORAL | 4 refills | Status: DC

## 2022-10-20 NOTE — Telephone Encounter (Signed)
Pharmacy Patient Advocate Encounter   Received notification from Physician's Office that prior authorization for Ketorolac Tromethamine 30 MG/ML injection solutions is required/requested.   Insurance verification completed.   The patient is insured through [facility] .   Per test claim: PA required; PA submitted to [facility] via CoverMyMeds Key/confirmation #/EOC [id number] Status is pending

## 2022-10-20 NOTE — Telephone Encounter (Signed)
PA request has been Submitted. New Encounter created for follow up. For additional info see Pharmacy Prior Auth telephone encounter from 10/20/2022.

## 2022-10-20 NOTE — Telephone Encounter (Signed)
Message sent to PA team

## 2022-10-20 NOTE — Progress Notes (Signed)
[facility]  PATIENT: Cindy Turner DOB: 11-Aug-1985  REFERRING DOCTOR OR PCP: Kennis Carina, MD, Clement Sayres, MD SOURCE: Patient, previous notes from [facility] neurology, imaging and lab reports, MRI images personally reviewed.  _________________________________   HISTORICAL  CHIEF COMPLAINT:  Chief Complaint  Patient presents with   Follow-up    Pt in room 11. Here for migraine follow up. Pt migraines are 3-4 times per month, pt said Toradol helps well.     HISTORY OF PRESENT ILLNESS:  Cindy Turner, and [facility] for neurologic consultation regarding her    Update 10/20/2022 She stopped the levetiracetam and migraines returned near daily.   She went back on Keppra and frequency is better but still has 3 severe HA associated with numbness and N/V.   When one occurs she takes Toradol but may need Imitrex (may also repeat 30 mg Toradol if more severe).   One very severe one did not get better an she took Benadryl and was better when she woke up.   She sometimes has an aura of mental fog and dysarthria.  These migraines are usually the worse.   When pain is present she has pounding right frontal pain, nausea/vomiting, photophobia/phonophobia.  Moving worsens the pain.      When she sleep better, migraines are less likely to occur.   He is on a prog/est hormone combination helping her sleep    Medications tried: Prevention:  Emgality did not help.   Topiramate did not help.   Venlafaxine did not help and was poorly tolerated.    Keppra helps some - initially drowsy but less so now  Rizatriptan did not help.     Sumatriptan may help   Toradol often helps  A combination of Benadryl , compazine, decadron sometimes has helped.      Her daughter, age 83, has recently had similar headaches.  Complicated migraine history: She is a 37 year old woman who had episodes of presyncope, left facial numbness, left >>right hand numbness  over the past few years.   Her 4th episode, 02/11/2021, occurred at work, she had the left sided numbness, her tongue felt thick and she had some trouble gettting words out and her gait was off balanced with some left leg weakness.  She went to the [facility] and was transferred to [facility] ED.   She had a severe right pounding headache.  She was evaluated and had MRI of the brain and cervical spine.  The brain showed a couple punctate T2/FLAIR hyperintense foci in the subcortical white matter.  This is nonspecific and could be related to migraines but also could be due to cardio emboli.  The cervical spine showed a minimal disc bulge at C5-C6 with no nerve root compression or spinal stenosis.  She has a strong FH of migraines:  Mother, brother and maternal side aunt.   Her brother and aunt also have similar neurologic symptoms c/w complicated migraine.   Her 23 and 91 year old daughter have migraine and the 79 year old has syncope associated with her migraines as well as visual changes and poor balance.     I also saw her as a patient in 2018.  At that time, she had had multiple episodes of syncope and presyncope.  She has a cardiac work-up as part of a POTS evaluation in 2018.   Her pulse is generally high.   EKG earlier this year was sinus rhythm with short PR and incomplete RBBB  and left posterior fascicular block.       Imaging (personally reviewed) MRI of the head and cervical spine 03/03/2021: There are a couple punctate T2/FLAIR hyperintense foci in the subcortical white matter of the frontal lobes.  The brain is otherwise normal.  Cervical spine is normal with just a very minimal bulge at C5-C6.  REVIEW OF SYSTEMS: Constitutional: No fevers, chills, sweats, or change in appetite Eyes: No visual changes, double vision, eye pain Ear, nose and throat: No hearing loss, ear pain, nasal congestion, sore throat Cardiovascular: No chest pain, palpitations.  Abnormal EKG. Respiratory:  No shortness  of breath at rest or with exertion.   No wheezes GastrointestinaI: No nausea, vomiting, diarrhea, abdominal pain, fecal incontinence Genitourinary:  No dysuria, urinary retention or frequency.  No nocturia. Musculoskeletal:  No neck pain, back pain Integumentary: No rash, pruritus, skin lesions Neurological: as above Psychiatric: No depression at this time.  No anxiety Endocrine: No palpitations, diaphoresis, change in appetite, change in weigh or increased thirst Hematologic/Lymphatic:  No anemia, purpura, petechiae. Allergic/Immunologic: No itchy/runny eyes, nasal congestion, recent allergic reactions, rashes  ALLERGIES: Allergies  Allergen Reactions   Pineapple Anaphylaxis, Itching and Rash   Magnesium Sulfate     Other reaction(s): Bradycardia, Confusion, Decreased Blood Pressure, Irregular Heart Rate, Other (See Comments) HIGH HEART RATE HIGH HEART RATE    Terbutaline Sulfate Other (See Comments)    Arrhythmia   Vitamin A Other (See Comments)    Headaches    Latex Rash    HOME MEDICATIONS:  Current Outpatient Medications:    albuterol (VENTOLIN HFA) 108 (90 Base) MCG/ACT inhaler, Inhale 1-2 puffs into the lungs every 4 (four) hours as needed., Disp: , Rfl:    Cholecalciferol (VITAMIN D) 50 MCG (2000 UT) CAPS, Take by mouth., Disp: , Rfl:    Flaxseed, Linseed, (FLAX SEED OIL PO), Take by mouth., Disp: , Rfl:    fluticasone (FLONASE) 50 MCG/ACT nasal spray, Place 1 spray into both nostrils daily., Disp: , Rfl:    GLUCOSAMINE SULFATE PO, Take by mouth., Disp: , Rfl:    Inulin (FIBER CHOICE PO), Take by mouth., Disp: , Rfl:    Iron Combinations (IRON COMPLEX PO), Take 65 mg by mouth daily., Disp: , Rfl:    Magnesium 250 MG TABS, Take 250 mg by mouth daily., Disp: , Rfl:    omega-3 acid ethyl esters (LOVAZA) 1 g capsule, Take by mouth 2 (two) times daily., Disp: , Rfl:    Omega-3 Fatty Acids (FISH OIL) 1000 MG CPDR, Take by mouth., Disp: , Rfl:    progesterone (PROMETRIUM) 100  MG capsule, Take 100 mg by mouth at bedtime., Disp: , Rfl:    SUMAtriptan (IMITREX) 100 MG tablet, Take 1 tablet (100 mg total) by mouth once as needed for up to 1 dose for migraine. May repeat in 2 hours if headache persists or recurs., Disp: 10 tablet, Rfl: 5   Turmeric (QC TUMERIC COMPLEX PO), Take 667 mg by mouth daily., Disp: , Rfl:    cyclobenzaprine (FLEXERIL) 10 MG tablet, Take 1 tablet (10 mg total) by mouth 2 (two) times daily as needed for muscle spasms., Disp: 20 tablet, Rfl: 0   doxycycline (VIBRAMYCIN) 100 MG capsule, Take 1 capsule (100 mg total) by mouth 2 (two) times daily., Disp: 20 capsule, Rfl: 0   ketorolac (TORADOL) 30 MG/ML injection, Inject 1 mL intramuscular prn migraine.  Dispense with #10 one cc syringes and 1 inch 25 g needle.   No more  than 2 in one day, Disp: 10 mL, Rfl: 11   levETIRAcetam (KEPPRA) 750 MG tablet, One po three times a day, Disp: 270 tablet, Rfl: 4  PAST MEDICAL HISTORY: Past Medical History:  Diagnosis Date   Allergy    Family history of melanoma    Fibromyalgia    Migraines    Raynaud's disease    Ureterolithiasis     PAST SURGICAL HISTORY: Past Surgical History:  Procedure Laterality Date   APPENDECTOMY     BREAST BIOPSY Left    DILATION AND CURETTAGE OF UTERUS     kidney stent     LAPAROSCOPY     TONSILLECTOMY     TUBAL LIGATION     TUBAL LIGATION      FAMILY HISTORY: Family History  Problem Relation Age of Onset   Hypertension Mother    Arthritis Mother    Breast cancer Mother 34   Uterine cancer Mother    Legg-Calve-Perthes disease Father    Healthy Brother    Melanoma Maternal Aunt        dx in her 12s   Breast cancer Maternal Grandmother        dx in her 30s   Throat cancer Maternal Grandfather        smoker; dx in 40s   Melanoma Paternal Grandfather    Hypertension Paternal Grandfather    Diabetes Paternal Grandfather    Heart attack Paternal Grandfather    Asthma Daughter    Healthy Daughter    Lung cancer  Other     SOCIAL HISTORY:  Social History   Socioeconomic History   Marital status: Married    Spouse name: Greig Castilla   Number of children: 2   Years of education: Not on file   Highest education level: Some college, no degree  Occupational History   Not on file  Tobacco Use   Smoking status: Never   Smokeless tobacco: Never  Vaping Use   Vaping status: Never Used  Substance and Sexual Activity   Alcohol use: No   Drug use: No   Sexual activity: Not on file    Comment: tubal ligation  Other Topics Concern   Not on file  Social History Narrative   Lives at home with husband and children   Right handed   Caffeine: 1 C of coffee a day, sometimes 2. Zero sugar dr pepper 1x week.   Social Determinants of Health   Financial Resource Strain: Not on file  Food Insecurity: No Food Insecurity (02/12/2020)   Received from [facility], [facility]   Hunger Vital Sign    Worried About Running Out of Food in the Last Year: Never true    Ran Out of Food in the Last Year: Never true  Transportation Needs: Not on file  Physical Activity: Not on file  Stress: Not on file  Social Connections: Unknown (06/10/2021)   Received from [facility], [facility]   Social Network    Social Network: Not on file  Intimate Partner Violence: Unknown (05/14/2021)   Received from [facility], [facility]   HITS    Physically Hurt: Not on file    Insult or Talk Down To: Not on file    Threaten Physical Harm: Not on file    Scream or Curse: Not on file     PHYSICAL EXAM  Vitals:   10/20/22 0839  BP: 114/76  Pulse: (!) 59  Weight: 157 lb (71.2 kg)  Height: 5\' 7"  (1.702  m)    Body mass index is 24.59 kg/m.   General: The patient is well-developed and well-nourished and in no acute distress  HEENT:  Head is Floyd/AT.  Sclera are anicteric.  Skin: Extremities are without rash or  edema.  Musculoskeletal: She has mild tenderness at the occiput on the right  Neurologic  Exam  Mental status: The patient is alert and oriented x 3 at the time of the examination. The patient has apparent normal recent and remote memory, with an apparently normal attention span and concentration ability.   Speech is normal.  Cranial nerves: Extraocular movements are full.  There is good facial sensation to soft touch bilaterally.Facial strength is normal.  Trapezius and sternocleidomastoid strength is normal. No dysarthria is noted.   Motor:  Muscle bulk is normal.   Tone is normal. Strength is  5 / 5 in all 4 extremities.   Sensory: Sensory testing is intact to pinprick, soft touch and vibration sensation in all 4 extremities except slight reduced vibration in right toes.     Coordination: Cerebellar testing reveals good finger-nose-finger and heel-to-shin bilaterally.  Gait and station: Station is normal.   Gait is normal. Tandem gait is normal. Romberg is negative.   Reflexes: Deep tendon reflexes are symmetric and normal bilaterally.        DIAGNOSTIC DATA (LABS, IMAGING, TESTING) - I reviewed patient records, labs, notes, testing and imaging myself where available.  Lab Results  Component Value Date   WBC 5.1 03/03/2021   HGB 13.3 03/03/2021   HCT 43.0 03/03/2021   MCV 76.4 (L) 03/03/2021   PLT 260 03/03/2021      Component Value Date/Time   NA 137 03/03/2021 1449   NA 139 03/04/2017 1518   K 4.0 03/03/2021 1449   CL 105 03/03/2021 1449   CO2 22 03/03/2021 1449   GLUCOSE 90 03/03/2021 1449   BUN 11 03/03/2021 1449   BUN 10 03/04/2017 1518   CREATININE 0.80 03/03/2021 1449   CALCIUM 9.6 03/03/2021 1449   PROT 7.8 03/03/2021 1449   PROT 7.1 03/04/2017 1518   ALBUMIN 4.8 03/03/2021 1449   ALBUMIN 4.7 03/04/2017 1518   AST 11 (L) 03/03/2021 1449   ALT 9 03/03/2021 1449   ALKPHOS 39 03/03/2021 1449   BILITOT 0.5 03/03/2021 1449   BILITOT 0.8 03/04/2017 1518   GFRNONAA >60 03/03/2021 1449   GFRAA >60 12/31/2017 1138   Lab Results  Component Value  Date   CHOL 125 07/09/2016   HDL 60 07/09/2016   LDLCALC 56 07/09/2016   TRIG 44 07/09/2016   CHOLHDL 2.1 07/09/2016   Lab Results  Component Value Date   HGBA1C 5.2 07/09/2016   No results found for: "VITAMINB12" Lab Results  Component Value Date   TSH 0.802 07/09/2016       ASSESSMENT AND PLAN  Complicated migraine  Occipital neuralgia of right side  Neck pain  White matter abnormality on MRI of brain  Numbness   Keppra for migraine prophylaxis, can increase dose to 750 mg up to 3 times a day Can take compazine/benadryl/decadron or toradol 30 mg IM +/- sumatriptan when migraine occurs.     Can do 60 mg Toradol if more severe migrai e Rtc 6 months or sooner if new or worsening neurologic issue   Tauna Macfarlane A. Epimenio Foot, MD, [facility] 10/20/2022, 9:38 AM Certified in Neurology, Clinical Neurophysiology, Sleep Medicine and Neuroimaging  [facility] [address] [place], [place] [address] [phone]

## 2022-10-30 NOTE — Telephone Encounter (Signed)
Pharmacy Patient Advocate Encounter  Received notification from [facility] that Prior Authorization for Ketorolac Tromethamine 30 MG/ML injection solutions has been DENIED.  Full denial letter will be uploaded to the media tab. See denial reason below.    PA #/Case ID/Reference #: PA Case ID #: [id number] [id number] Complicated Migraine

## 2022-11-03 NOTE — Telephone Encounter (Signed)
I looked at the [facility] formulary for coverage. Injectable Ketorolac is not on forumlary.  Ketorolac Tromethamine (Oral Tablet) (Generic) is a tier 3 drug.    Would you like to change to oral Ketorolac? Can appeal if needed.  Other medications available on formulary include:

## 2022-11-04 ENCOUNTER — Encounter: Payer: Self-pay | Admitting: Neurology

## 2022-11-27 ENCOUNTER — Other Ambulatory Visit: Payer: Self-pay | Admitting: Neurology

## 2022-11-30 NOTE — Telephone Encounter (Signed)
Last seen on 10/20/22 Follow up scheduled on 05/03/23

## 2022-12-23 ENCOUNTER — Other Ambulatory Visit: Payer: Self-pay

## 2022-12-23 ENCOUNTER — Encounter: Payer: Self-pay | Admitting: Neurology

## 2022-12-29 ENCOUNTER — Other Ambulatory Visit: Payer: Self-pay | Admitting: Obstetrics & Gynecology

## 2022-12-29 DIAGNOSIS — Z1231 Encounter for screening mammogram for malignant neoplasm of breast: Secondary | ICD-10-CM

## 2023-01-05 ENCOUNTER — Ambulatory Visit
Admission: RE | Admit: 2023-01-05 | Discharge: 2023-01-05 | Disposition: A | Discharge status: Home / Self Care | Payer: 59 | Source: Ambulatory Visit | Attending: Obstetrics & Gynecology | Admitting: Obstetrics & Gynecology

## 2023-01-05 DIAGNOSIS — Z1231 Encounter for screening mammogram for malignant neoplasm of breast: Secondary | ICD-10-CM

## 2023-01-26 ENCOUNTER — Other Ambulatory Visit: Payer: Self-pay | Admitting: Neurology

## 2023-01-26 NOTE — Telephone Encounter (Signed)
Last seen on 10/20/22 Follow up scheduled on 05/03/23

## 2023-03-06 ENCOUNTER — Inpatient Hospital Stay (HOSPITAL_COMMUNITY)
Admission: AD | Admit: 2023-03-06 | Discharge: 2023-03-06 | Disposition: A | Discharge status: Home / Self Care | Payer: 59 | Attending: Obstetrics and Gynecology | Admitting: Obstetrics and Gynecology

## 2023-03-06 ENCOUNTER — Other Ambulatory Visit: Payer: Self-pay

## 2023-03-06 ENCOUNTER — Encounter (HOSPITAL_COMMUNITY): Payer: Self-pay | Admitting: Obstetrics and Gynecology

## 2023-03-06 DIAGNOSIS — N390 Urinary tract infection, site not specified: Secondary | ICD-10-CM | POA: Diagnosis not present

## 2023-03-06 DIAGNOSIS — T360X5A Adverse effect of penicillins, initial encounter: Secondary | ICD-10-CM | POA: Diagnosis not present

## 2023-03-06 DIAGNOSIS — Z9889 Other specified postprocedural states: Secondary | ICD-10-CM | POA: Insufficient documentation

## 2023-03-06 DIAGNOSIS — T7840XA Allergy, unspecified, initial encounter: Secondary | ICD-10-CM

## 2023-03-06 DIAGNOSIS — Z90710 Acquired absence of both cervix and uterus: Secondary | ICD-10-CM | POA: Diagnosis not present

## 2023-03-06 DIAGNOSIS — R21 Rash and other nonspecific skin eruption: Secondary | ICD-10-CM | POA: Insufficient documentation

## 2023-03-06 MED ORDER — NITROFURANTOIN MONOHYD MACRO 100 MG PO CAPS: 100.0000 mg | ORAL_CAPSULE | Freq: Two times a day (BID) | ORAL | 0 refills | Status: AC

## 2023-03-06 NOTE — MAU Note (Addendum)
Cindy Turner is a 38 y.o. at Unknown here in MAU reporting: she was instructed by her MD to have her incisions checked.  States had surgery done at [facility] in [facility].  Reports incision on right side red and swollen, started having thick brown drainage.  Reports taking Amoxicillin for UTI and took Benadryl for hives. S/P Hysterectomy 02/24/2023 LMP: NA Onset of complaint: last night Pain score: 4 Vitals:   03/06/23 1717  BP: 121/75  Pulse: 87  Resp: 19  Temp: 97.9 F (36.6 C)  SpO2: 100%     FHT: NA  Lab orders placed from triage: NA

## 2023-03-06 NOTE — MAU Provider Note (Signed)
History     CSN: [id number]  Arrival date and time: 03/06/23 1659   Event Date/Time   First Provider Initiated Contact with Patient 03/06/23 1724      No chief complaint on file.  HPI  Cindy Turner is a 38 y.o. [id number] s/p laprascopic hysterectomy on 1/15 who presents for evaluation of redness at her incision sites. Patient reports it started suddenly yesterday and got worse today. She reports around 230pm she broke out in hives and got a rash as well.  She is currently taking amoxicillin for a UTI.    OB History     Gravida  3   Para  2   Term  2   Preterm      AB  1   Living  3      SAB  1   IAB      Ectopic      Multiple      Live Births              Past Medical History:  Diagnosis Date   Allergy    Family history of melanoma    Fibromyalgia    Migraines    Raynaud's disease    Ureterolithiasis     Past Surgical History:  Procedure Laterality Date   APPENDECTOMY     BREAST BIOPSY Left    DILATION AND CURETTAGE OF UTERUS     kidney stent     LAPAROSCOPY     TONSILLECTOMY     TUBAL LIGATION     TUBAL LIGATION      Family History  Problem Relation Age of Onset   Hypertension Mother    Arthritis Mother    Breast cancer Mother 25   Uterine cancer Mother    Legg-Calve-Perthes disease Father    Healthy Brother    Melanoma Maternal Aunt        dx in her 62s   Breast cancer Maternal Grandmother        dx in her 30s   Throat cancer Maternal Grandfather        smoker; dx in 15s   Melanoma Paternal Grandfather    Hypertension Paternal Grandfather    Diabetes Paternal Grandfather    Heart attack Paternal Grandfather    Asthma Daughter    Healthy Daughter    Lung cancer Other     Social History   Tobacco Use   Smoking status: Never   Smokeless tobacco: Never  Vaping Use   Vaping status: Never Used  Substance Use Topics   Alcohol use: No   Drug use: No    Allergies:  Allergies  Allergen Reactions   Pineapple  Anaphylaxis, Itching and Rash   Magnesium Sulfate     Other reaction(s): Bradycardia, Confusion, Decreased Blood Pressure, Irregular Heart Rate, Other (See Comments) HIGH HEART RATE HIGH HEART RATE    Terbutaline Sulfate Other (See Comments)    Arrhythmia   Vitamin A Other (See Comments)    Headaches    Latex Rash    Medications Prior to Admission  Medication Sig Dispense Refill Last Dose/Taking   albuterol (VENTOLIN HFA) 108 (90 Base) MCG/ACT inhaler Inhale 1-2 puffs into the lungs every 4 (four) hours as needed.      Cholecalciferol (VITAMIN D) 50 MCG (2000 UT) CAPS Take by mouth.      Flaxseed, Linseed, (FLAX SEED OIL PO) Take by mouth.      fluticasone (FLONASE) 50 MCG/ACT nasal spray Place 1 spray  into both nostrils daily.      GLUCOSAMINE SULFATE PO Take by mouth.      Inulin (FIBER CHOICE PO) Take by mouth.      Iron Combinations (IRON COMPLEX PO) Take 65 mg by mouth daily.      ketorolac (TORADOL) 30 MG/ML injection Inject 1 mL intramuscular prn migraine.  Dispense with #10 one cc syringes and 1 inch 25 g needle.   No more than 2 in one day 10 mL 11    ketorolac (TORADOL) 60 MG/2ML SOLN injection INJECT 1 ML INTO MUSCLE AS NEEDED FOR MIGRAINE UD. MAX TWICE PER DAY 8 mL 2    levETIRAcetam (KEPPRA) 750 MG tablet One po three times a day 270 tablet 4    Magnesium 250 MG TABS Take 250 mg by mouth daily.      omega-3 acid ethyl esters (LOVAZA) 1 g capsule Take by mouth 2 (two) times daily.      Omega-3 Fatty Acids (FISH OIL) 1000 MG CPDR Take by mouth.      progesterone (PROMETRIUM) 100 MG capsule Take 100 mg by mouth at bedtime.      SUMAtriptan (IMITREX) 100 MG tablet TAKE 1 TAB BY MOUTH ONCE AS NEEDED FOR MIGRAINE. MAY REPEAT IN 2 HOURS IF HEADACHE PERSISTS/RECURS 10 tablet 2    Turmeric (QC TUMERIC COMPLEX PO) Take 667 mg by mouth daily.       Review of Systems  Constitutional: Negative.  Negative for fatigue and fever.  HENT: Negative.    Respiratory: Negative.  Negative  for shortness of breath.   Cardiovascular: Negative.  Negative for chest pain.  Gastrointestinal: Negative.  Negative for abdominal pain, constipation, diarrhea, nausea and vomiting.  Genitourinary: Negative.  Negative for dysuria.  Neurological: Negative.  Negative for dizziness and headaches.   Physical Exam   Blood pressure 121/75, pulse 87, temperature 97.9 F (36.6 C), temperature source Oral, resp. rate 19, height 5\' 7"  (1.702 m), weight 71.9 kg, SpO2 100%.  Patient Vitals for the past 24 hrs:  BP Temp Temp src Pulse Resp SpO2 Height Weight  03/06/23 1717 121/75 97.9 F (36.6 C) Oral 87 19 100 % -- --  03/06/23 1711 -- -- -- -- -- -- 5\' 7"  (1.702 m) 71.9 kg    Physical Exam Vitals and nursing note reviewed.  Constitutional:      General: She is not in acute distress.    Appearance: She is well-developed.  HENT:     Head: Normocephalic.  Eyes:     Pupils: Pupils are equal, round, and reactive to light.  Cardiovascular:     Rate and Rhythm: Normal rate and regular rhythm.     Heart sounds: Normal heart sounds.  Pulmonary:     Effort: Pulmonary effort is normal. No respiratory distress.     Breath sounds: Normal breath sounds.  Abdominal:     General: Bowel sounds are normal. There is no distension.     Palpations: Abdomen is soft.     Tenderness: There is no abdominal tenderness.    Skin:    General: Skin is warm and dry.  Neurological:     Mental Status: She is alert and oriented to person, place, and time.  Psychiatric:        Mood and Affect: Mood normal.        Behavior: Behavior normal.        Thought Content: Thought content normal.        Judgment: Judgment normal.  MAU Course  Procedures  MDM  Redness at incision sites and rash noted on abdomen. Dr. Earlene Plater called and will come see patient. See MD note for exam.   Assessment and Plan   1. Allergic reaction, initial encounter   2. Status post hysterectomy     -Discharge home in stable  condition -Rx for macrobid to finish UTI tx -Patient advised to follow-up with GYN for surgery follow up -Patient may return to MAU as needed or if her condition were to change or worsen  Rolm Bookbinder, CNM 03/06/2023, 5:24 PM

## 2023-03-06 NOTE — Progress Notes (Signed)
Dr. Earlene Plater into see pt and eval status

## 2023-05-03 ENCOUNTER — Ambulatory Visit: Payer: 59 | Admitting: Neurology

## 2023-05-03 ENCOUNTER — Encounter: Payer: Self-pay | Admitting: Neurology

## 2023-05-03 VITALS — BP 111/67 | HR 56 | Ht 67.0 in | Wt 162.0 lb

## 2023-05-03 DIAGNOSIS — G43109 Migraine with aura, not intractable, without status migrainosus: Secondary | ICD-10-CM

## 2023-05-03 DIAGNOSIS — M542 Cervicalgia: Secondary | ICD-10-CM

## 2023-05-03 DIAGNOSIS — G44219 Episodic tension-type headache, not intractable: Secondary | ICD-10-CM

## 2023-05-03 DIAGNOSIS — G43009 Migraine without aura, not intractable, without status migrainosus: Secondary | ICD-10-CM

## 2023-05-03 DIAGNOSIS — M5481 Occipital neuralgia: Secondary | ICD-10-CM | POA: Diagnosis not present

## 2023-05-03 MED ORDER — METHYLPREDNISOLONE ACETATE 80 MG/ML IJ SUSP
80.0000 mg | Freq: Once | INTRAMUSCULAR | Status: AC
  Administered 2023-05-03: 80 mg via INTRAMUSCULAR

## 2023-05-03 MED ORDER — BUPIVACAINE HCL 0.5 % IJ SOLN: 3.0000 mL | Freq: Once | INTRAMUSCULAR | Status: AC

## 2023-05-03 NOTE — Progress Notes (Signed)
 [facility]  PATIENT: Cindy Turner DOB: 1985/08/01  REFERRING DOCTOR OR PCP: Kennis Carina, MD, Clement Sayres, MD SOURCE: Patient, previous notes from [facility] neurology, imaging and lab reports, MRI images personally reviewed.  _________________________________   HISTORICAL  CHIEF COMPLAINT:  Chief Complaint  Patient presents with   Follow-up    Pt in 10 alone Pt states migraine this am Pt states pain level 6 Pt states gave self a toradol injection 1 mL at 530 this am Pt states has had migraine pain since sat evening.     HISTORY OF PRESENT ILLNESS:  Cindy Turner, and [facility] for neurologic consultation regarding her    Update 05/03/2023 She is having more HA.  Keppra has helped the frequency  but she has had more HA recently.   When one occurs she takes a Toradol.   She also often takes sumatriptan.     She is on Keppra 70 mg po tid.      She is having 5-8 migraines a week now.  They were almost every day prior to Keppra.   A few years ago, we did a splenius capitus TPI / ONB with benefit x many days.     When pain is present she has pounding right frontal pain, nausea/vomiting, photophobia/phonophobia.  Moving worsens the pain.      When she sleep better, migraines are less likely to occur.   He is on a prog/est hormone combination helping her sleep    Medications tried: Prevention:  Emgality did not help at all.   Topiramate did not help.   Venlafaxine did not help and was poorly tolerated.    Keppra helps some to reduce frquency by 1/2 or so.    Treatment:   Rizatriptan did not help.     Sumatriptan help s   Toradol often helps the best A combination of Benadryl , compazine, decadron sometimes has helped.      Her daughter, age 36, has recently had similar headaches.  Complicated migraine history: She is a 38 year old woman who had episodes of presyncope, left facial numbness, left >>right hand numbness over the  past few years.   Her 4th episode, 02/11/2021, occurred at work, she had the left sided numbness, her tongue felt thick and she had some trouble gettting words out and her gait was off balanced with some left leg weakness.  She went to the [facility] and was transferred to [facility] ED.   She had a severe right pounding headache.  She was evaluated and had MRI of the brain and cervical spine.  The brain showed a couple punctate T2/FLAIR hyperintense foci in the subcortical white matter.  This is nonspecific and could be related to migraines but also could be due to cardio emboli.  The cervical spine showed a minimal disc bulge at C5-C6 with no nerve root compression or spinal stenosis.  She has a strong FH of migraines:  Mother, brother and maternal side aunt.   Her brother and aunt also have similar neurologic symptoms c/w complicated migraine.   Her 48 and 53 year old daughter have migraine and the 28 year old has syncope associated with her migraines as well as visual changes and poor balance.     I also saw her as a patient in 2018.  At that time, she had had multiple episodes of syncope and presyncope.  She has a cardiac work-up as part of a POTS evaluation in 2018.  Her pulse is generally high.   EKG earlier this year was sinus rhythm with short PR and incomplete RBBB and left posterior fascicular block.       Imaging (personally reviewed) MRI of the head and cervical spine 03/03/2021: There are a couple punctate T2/FLAIR hyperintense foci in the subcortical white matter of the frontal lobes.  The brain is otherwise normal.  Cervical spine is normal with just a very minimal bulge at C5-C6.  REVIEW OF SYSTEMS: Constitutional: No fevers, chills, sweats, or change in appetite Eyes: No visual changes, double vision, eye pain Ear, nose and throat: No hearing loss, ear pain, nasal congestion, sore throat Cardiovascular: No chest pain, palpitations.  Abnormal EKG. Respiratory:  No shortness of breath  at rest or with exertion.   No wheezes GastrointestinaI: No nausea, vomiting, diarrhea, abdominal pain, fecal incontinence Genitourinary:  No dysuria, urinary retention or frequency.  No nocturia. Musculoskeletal:  No neck pain, back pain Integumentary: No rash, pruritus, skin lesions Neurological: as above Psychiatric: No depression at this time.  No anxiety Endocrine: No palpitations, diaphoresis, change in appetite, change in weigh or increased thirst Hematologic/Lymphatic:  No anemia, purpura, petechiae. Allergic/Immunologic: No itchy/runny eyes, nasal congestion, recent allergic reactions, rashes  ALLERGIES: Allergies  Allergen Reactions   Pineapple Anaphylaxis, Itching and Rash   Magnesium Sulfate     Other reaction(s): Bradycardia, Confusion, Decreased Blood Pressure, Irregular Heart Rate, Other (See Comments) HIGH HEART RATE HIGH HEART RATE    Terbutaline Sulfate Other (See Comments)    Arrhythmia   Vitamin A Other (See Comments)    Headaches    Latex Rash    HOME MEDICATIONS:  Current Outpatient Medications:    albuterol (VENTOLIN HFA) 108 (90 Base) MCG/ACT inhaler, Inhale 1-2 puffs into the lungs every 4 (four) hours as needed., Disp: , Rfl:    Cholecalciferol (VITAMIN D) 50 MCG (2000 UT) CAPS, Take by mouth., Disp: , Rfl:    Flaxseed, Linseed, (FLAX SEED OIL PO), Take by mouth., Disp: , Rfl:    fluticasone (FLONASE) 50 MCG/ACT nasal spray, Place 1 spray into both nostrils daily., Disp: , Rfl:    ketorolac (TORADOL) 30 MG/ML injection, Inject 1 mL intramuscular prn migraine.  Dispense with #10 one cc syringes and 1 inch 25 g needle.   No more than 2 in one day, Disp: 10 mL, Rfl: 11   ketorolac (TORADOL) 60 MG/2ML SOLN injection, INJECT 1 ML INTO MUSCLE AS NEEDED FOR MIGRAINE UD. MAX TWICE PER DAY, Disp: 8 mL, Rfl: 2   levETIRAcetam (KEPPRA) 750 MG tablet, One po three times a day, Disp: 270 tablet, Rfl: 4   Magnesium 250 MG TABS, Take 250 mg by mouth daily., Disp: ,  Rfl:    nitrofurantoin, macrocrystal-monohydrate, (MACROBID) 100 MG capsule, Take 1 capsule (100 mg total) by mouth 2 (two) times daily., Disp: 10 capsule, Rfl: 0   omega-3 acid ethyl esters (LOVAZA) 1 g capsule, Take by mouth 2 (two) times daily., Disp: , Rfl:    Omega-3 Fatty Acids (FISH OIL) 1000 MG CPDR, Take by mouth., Disp: , Rfl:    progesterone (PROMETRIUM) 100 MG capsule, Take 100 mg by mouth at bedtime., Disp: , Rfl:    SUMAtriptan (IMITREX) 100 MG tablet, TAKE 1 TAB BY MOUTH ONCE AS NEEDED FOR MIGRAINE. MAY REPEAT IN 2 HOURS IF HEADACHE PERSISTS/RECURS, Disp: 10 tablet, Rfl: 2   Turmeric (QC TUMERIC COMPLEX PO), Take 667 mg by mouth daily., Disp: , Rfl:    GLUCOSAMINE SULFATE PO,  Take by mouth., Disp: , Rfl:    Inulin (FIBER CHOICE PO), Take by mouth., Disp: , Rfl:    Iron Combinations (IRON COMPLEX PO), Take 65 mg by mouth daily., Disp: , Rfl:   Current Facility-Administered Medications:    bupivacaine (MARCAINE) 0.5 % (with pres) injection 3 mL, 3 mL, Infiltration, Once, Zyanne Schumm A, MD   methylPREDNISolone acetate (DEPO-MEDROL) injection 80 mg, 80 mg, Intramuscular, Once, Jesua Tamblyn, Pearletha Furl, MD  PAST MEDICAL HISTORY: Past Medical History:  Diagnosis Date   Allergy    Family history of melanoma    Fibromyalgia    Migraines    Raynaud's disease    Ureterolithiasis     PAST SURGICAL HISTORY: Past Surgical History:  Procedure Laterality Date   ABDOMINAL HYSTERECTOMY     APPENDECTOMY     BREAST BIOPSY Left    DILATION AND CURETTAGE OF UTERUS     kidney stent     LAPAROSCOPY     TONSILLECTOMY     TUBAL LIGATION     TUBAL LIGATION      FAMILY HISTORY: Family History  Problem Relation Age of Onset   Hypertension Mother    Arthritis Mother    Breast cancer Mother 7   Uterine cancer Mother    Legg-Calve-Perthes disease Father    Healthy Brother    Melanoma Maternal Aunt        dx in her 79s   Breast cancer Maternal Grandmother        dx in her 30s   Throat  cancer Maternal Grandfather        smoker; dx in 1s   Melanoma Paternal Grandfather    Hypertension Paternal Grandfather    Diabetes Paternal Grandfather    Heart attack Paternal Grandfather    Asthma Daughter    Healthy Daughter    Lung cancer Other    Migraines Neg Hx     SOCIAL HISTORY:  Social History   Socioeconomic History   Marital status: Married    Spouse name: Greig Castilla   Number of children: 2   Years of education: Not on file   Highest education level: Some college, no degree  Occupational History   Not on file  Tobacco Use   Smoking status: Never   Smokeless tobacco: Never  Vaping Use   Vaping status: Never Used  Substance and Sexual Activity   Alcohol use: No   Drug use: No   Sexual activity: Not on file    Comment: tubal ligation  Other Topics Concern   Not on file  Social History Narrative   Lives at home with husband and children   Right handed   Caffeine: 1 C of coffee a day, sometimes 2. Zero sugar dr pepper 1x week.   Social Drivers of Corporate investment banker Strain: Low Risk  (12/01/2022)   Received from [facility]   Overall Financial Resource Strain (CARDIA)    Difficulty of Paying Living Expenses: Not hard at all  Food Insecurity: Low Risk  (02/25/2023)   Received from [facility]   Hunger Vital Sign    Worried About Running Out of Food in the Last Year: Never true    Ran Out of Food in the Last Year: Never true  Transportation Needs: No Transportation Needs (02/25/2023)   Received from [facility]    In the past 12 months, has lack of reliable transportation kept you from medical appointments, meetings, work or from getting things needed  for daily living? : No  Physical Activity: Sufficiently Active (12/01/2022)   Received from [facility]   Exercise Vital Sign    Days of Exercise per Week: 5 days    Minutes of Exercise per Session: 30 min  Stress: No Stress Concern Present (12/01/2022)   Received from  [facility] - Occupational Stress Questionnaire    Feeling of Stress : Not at all  Social Connections: Socially Integrated (12/01/2022)   Received from [facility]   Social Network    How would you rate your social network (family, work, friends)?: Good participation with social networks  Intimate Partner Violence: Not At Risk (12/01/2022)   Received from [facility]   HITS    Over the last 12 months how often did your partner physically hurt you?: Never    Over the last 12 months how often did your partner insult you or talk down to you?: Never    Over the last 12 months how often did your partner threaten you with physical harm?: Never    Over the last 12 months how often did your partner scream or curse at you?: Never     PHYSICAL EXAM  Vitals:   05/03/23 0808  BP: 111/67  Pulse: (!) 56  Weight: 162 lb (73.5 kg)  Height: 5\' 7"  (1.702 m)    Body mass index is 25.37 kg/m.   General: The patient is well-developed and well-nourished and in no acute distress  HEENT:  Head is [place]/AT.  Sclera are anicteric.  Skin: Extremities are without rash or  edema.  Musculoskeletal: She has mild tenderness at the occiput on the right  Neurologic Exam  Mental status: The patient is alert and oriented x 3 at the time of the examination. The patient has apparent normal recent and remote memory, with an apparently normal attention span and concentration ability.   Speech is normal.  Cranial nerves: Extraocular movements are full.  There is good facial sensation to soft touch bilaterally.Facial strength is normal.  Trapezius and sternocleidomastoid strength is normal. No dysarthria is noted.   Motor:  Muscle bulk is normal.   Tone is normal. Strength is  5 / 5 in all 4 extremities.   Sensory: Sensory testing is intact to pinprick, soft touch and vibration sensation in all 4 extremities except slight reduced vibration in right toes.      Coordination: Cerebellar testing reveals good finger-nose-finger and heel-to-shin bilaterally.  Gait and station: Station is normal.   Gait is normal. Tandem gait is normal. Romberg is negative.   Reflexes: Deep tendon reflexes are symmetric and normal bilaterally.        DIAGNOSTIC DATA (LABS, IMAGING, TESTING) - I reviewed patient records, labs, notes, testing and imaging myself where available.  Lab Results  Component Value Date   WBC 5.1 03/03/2021   HGB 13.3 03/03/2021   HCT 43.0 03/03/2021   MCV 76.4 (L) 03/03/2021   PLT 260 03/03/2021      Component Value Date/Time   NA 137 03/03/2021 1449   NA 139 03/04/2017 1518   K 4.0 03/03/2021 1449   CL 105 03/03/2021 1449   CO2 22 03/03/2021 1449   GLUCOSE 90 03/03/2021 1449   BUN 11 03/03/2021 1449   BUN 10 03/04/2017 1518   CREATININE 0.80 03/03/2021 1449   CALCIUM 9.6 03/03/2021 1449   PROT 7.8 03/03/2021 1449   PROT 7.1 03/04/2017 1518   ALBUMIN 4.8 03/03/2021 1449  ALBUMIN 4.7 03/04/2017 1518   AST 11 (L) 03/03/2021 1449   ALT 9 03/03/2021 1449   ALKPHOS 39 03/03/2021 1449   BILITOT 0.5 03/03/2021 1449   BILITOT 0.8 03/04/2017 1518   GFRNONAA >60 03/03/2021 1449   GFRAA >60 12/31/2017 1138   Lab Results  Component Value Date   CHOL 125 07/09/2016   HDL 60 07/09/2016   LDLCALC 56 07/09/2016   TRIG 44 07/09/2016   CHOLHDL 2.1 07/09/2016   Lab Results  Component Value Date   HGBA1C 5.2 07/09/2016   No results found for: "VITAMINB12" Lab Results  Component Value Date   TSH 0.802 07/09/2016       ASSESSMENT AND PLAN  Complicated migraine - Plan: methylPREDNISolone acetate (DEPO-MEDROL) injection 80 mg, bupivacaine (MARCAINE) 0.5 % (with pres) injection 3 mL  Occipital neuralgia of right side  Neck pain  Common migraine without intractability  Episodic tension-type headache, not intractable   Keppra for migraine prophylaxis, can increase dose to 750 mg up to 3 times a day Can take  compazine/benadryl/decadron or toradol 30 mg IM +/- sumatriptan when migraine occurs.     Can do 60 mg Toradol if more severe migraine Trigger point injections into right splenius capitus using 80 mg Depo-Medrol and sterile technique.   She tolerated the injections well and there were no complications. Pain was better afterwards.  Rtc 6 months or sooner if new or worsening neurologic issue   Merline Perkin A. Epimenio Foot, MD, [facility] 05/03/2023, 12:59 PM Certified in Neurology, Clinical Neurophysiology, Sleep Medicine and Neuroimaging  [facility] [address], [address] [place], [place] [address] [phone]

## 2023-05-13 ENCOUNTER — Other Ambulatory Visit: Payer: Self-pay | Admitting: Neurology

## 2023-07-27 ENCOUNTER — Encounter: Payer: Self-pay | Admitting: Neurology

## 2023-08-06 ENCOUNTER — Emergency Department (HOSPITAL_BASED_OUTPATIENT_CLINIC_OR_DEPARTMENT_OTHER)
Admission: EM | Admit: 2023-08-06 | Discharge: 2023-08-07 | Disposition: A | Discharge status: Home / Self Care | Attending: Emergency Medicine | Admitting: Emergency Medicine

## 2023-08-06 ENCOUNTER — Encounter (HOSPITAL_BASED_OUTPATIENT_CLINIC_OR_DEPARTMENT_OTHER): Payer: Self-pay

## 2023-08-06 ENCOUNTER — Emergency Department (HOSPITAL_BASED_OUTPATIENT_CLINIC_OR_DEPARTMENT_OTHER)

## 2023-08-06 ENCOUNTER — Other Ambulatory Visit: Payer: Self-pay

## 2023-08-06 DIAGNOSIS — H8110 Benign paroxysmal vertigo, unspecified ear: Secondary | ICD-10-CM | POA: Diagnosis not present

## 2023-08-06 DIAGNOSIS — Z91040 Latex allergy status: Secondary | ICD-10-CM | POA: Diagnosis not present

## 2023-08-06 DIAGNOSIS — R42 Dizziness and giddiness: Secondary | ICD-10-CM | POA: Diagnosis present

## 2023-08-06 MED ORDER — MECLIZINE HCL 25 MG PO TABS
25.0000 mg | ORAL_TABLET | Freq: Once | ORAL | Status: AC
  Administered 2023-08-06: 25 mg via ORAL
  Filled 2023-08-06: qty 1

## 2023-08-06 MED ORDER — MECLIZINE HCL 25 MG PO TABS: 25.0000 mg | ORAL_TABLET | Freq: Three times a day (TID) | ORAL | 0 refills | Status: AC | PRN

## 2023-08-06 NOTE — Discharge Instructions (Signed)
 Symptoms seem to be consistent with positional vertigo.  Take the Antivert  as directed every 8 hours.  Make an appointment to follow-up with your primary care doctor for next week.  Work note provided to be out of work for 1 week.  Return for any new or worse symptoms.  As we discussed CT head CT neck without any acute findings

## 2023-08-06 NOTE — ED Triage Notes (Signed)
 Pt c/o dizziness worse w movement, dizziness staying, doesn't go away. Advises this AM, she moved head & heard the 'pop' like I did in the beginning.  Known neck injury last week of May, seen by several specialists, follow up Tuesday w neurosurg

## 2023-08-06 NOTE — ED Provider Notes (Signed)
 [place] [facility]MENT AT [facility] Provider Note   CSN: [id number] Arrival date & time: 08/06/23  1747     Patient presents with: Dizziness   Cindy Turner is a 38 y.o. female.   Patient was at work today with acute onset of room spinning that occurred around 11:00 in the morning.  Patient is had small bouts of this in the past.  But this has been persistent and lasted.  No nausea no vomiting.  If she moves her head at all she gets a lot of room spinning if she stays perfectly still it is very minimal.  Patient apparently also being evaluated for cervical radiculopathy heard a pop in her neck.  No significant headache with this no fevers no chills.  Temp here is 98.7 pulse 61 respiration 17 blood pressure 117/81 oxygen sats are 99% on room air.  Has follow-up this week with neurosurgery for the cervical radiculopathy.  That was classic with creating some numbness in her left hand.  Past medical history significant for migraines fibromyalgia Raynaud's disease.  Past surgical history significant abdominal hysterectomy and patient is never used tobacco products.       Prior to Admission medications   Medication Sig Start Date End Date Taking? Authorizing Provider  meclizine  (ANTIVERT ) 25 MG tablet Take 1 tablet (25 mg total) by mouth 3 (three) times daily as needed for dizziness. 08/06/23  Yes Evelina Lore, MD  albuterol  (VENTOLIN  HFA) 108 (90 Base) MCG/ACT inhaler Inhale 1-2 puffs into the lungs every 4 (four) hours as needed. 04/15/22   [provider]  Cholecalciferol (VITAMIN D ) 50 MCG (2000 UT) CAPS Take by mouth.    [provider]  Flaxseed, Linseed, (FLAX SEED OIL PO) Take by mouth.    [provider]  fluticasone (FLONASE) 50 MCG/ACT nasal spray Place 1 spray into both nostrils daily. 04/09/21   [provider]  ketorolac  (TORADOL ) 30 MG/ML injection Inject 1 mL intramuscular prn migraine.  Dispense with #10 one cc  syringes and 1 inch 25 g needle.   No more than 2 in one day 10/20/22   Sater, Charlie DELENA, MD  ketorolac  (TORADOL ) 60 MG/2ML SOLN injection INJECT 1 ML INTO MUSCLE AS NEEDED FOR MIGRAINE AS DIRECTED . MAX TWICE PER DAY 05/13/23   Sater, Charlie DELENA, MD  levETIRAcetam  (KEPPRA ) 750 MG tablet One po three times a day 10/20/22   Sater, Charlie DELENA, MD  Magnesium 250 MG TABS Take 250 mg by mouth daily.    [provider]  nitrofurantoin , macrocrystal-monohydrate, (MACROBID ) 100 MG capsule Take 1 capsule (100 mg total) by mouth 2 (two) times daily. 03/06/23   Marylen Aleck HERO, CNM  omega-3 acid ethyl esters (LOVAZA) 1 g capsule Take by mouth 2 (two) times daily.    [provider]  Omega-3 Fatty Acids (FISH OIL) 1000 MG CPDR Take by mouth.    [provider]  progesterone (PROMETRIUM) 100 MG capsule Take 100 mg by mouth at bedtime. 10/07/22   [provider]  SUMAtriptan  (IMITREX ) 100 MG tablet TAKE 1 TAB BY MOUTH ONCE AS NEEDED FOR MIGRAINE. MAY REPEAT IN 2 HOURS IF HEADACHE PERSISTS/RECURS 01/26/23   Sater, Charlie DELENA, MD  Turmeric (QC TUMERIC COMPLEX PO) Take 667 mg by mouth daily.    [provider]    Allergies: Pineapple, Magnesium sulfate, Terbutaline sulfate, Vitamin a, and Latex    Review of Systems  Constitutional:  Negative for chills and fever.  HENT:  Negative  for ear pain, sore throat and tinnitus.   Eyes:  Negative for pain and visual disturbance.  Respiratory:  Negative for cough and shortness of breath.   Cardiovascular:  Negative for chest pain and palpitations.  Gastrointestinal:  Negative for abdominal pain, nausea and vomiting.  Genitourinary:  Negative for dysuria and hematuria.  Musculoskeletal:  Negative for arthralgias and back pain.  Skin:  Negative for color change and rash.  Neurological:  Positive for dizziness. Negative for seizures, syncope and headaches.  All other systems reviewed and are negative.   Updated Vital Signs BP  117/81   Pulse 61   Temp 98.7 F (37.1 C) (Oral)   Resp 17   SpO2 99%   Physical Exam Vitals and nursing note reviewed.  Constitutional:      General: She is not in acute distress.    Appearance: Normal appearance. She is well-developed.  HENT:     Head: Normocephalic and atraumatic.     Right Ear: Tympanic membrane normal.     Left Ear: Tympanic membrane normal.     Mouth/Throat:     Mouth: Mucous membranes are moist.   Eyes:     Extraocular Movements: Extraocular movements intact.     Conjunctiva/sclera: Conjunctivae normal.     Pupils: Pupils are equal, round, and reactive to light.    Cardiovascular:     Rate and Rhythm: Normal rate and regular rhythm.     Heart sounds: No murmur heard. Pulmonary:     Effort: Pulmonary effort is normal. No respiratory distress.     Breath sounds: Normal breath sounds.  Abdominal:     Palpations: Abdomen is soft.     Tenderness: There is no abdominal tenderness.   Musculoskeletal:        General: No swelling.     Cervical back: Normal range of motion and neck supple. No rigidity.   Skin:    General: Skin is warm and dry.     Capillary Refill: Capillary refill takes less than 2 seconds.   Neurological:     General: No focal deficit present.     Mental Status: She is alert and oriented to person, place, and time.     Comments: Reproducible vertigo with any head movement.  She gets a little bit of subtle movement with extraocular eye motion.  Neuroexam otherwise completely normal  Psychiatric:        Mood and Affect: Mood normal.     (all labs ordered are listed, but only abnormal results are displayed) Labs Reviewed - No data to display  EKG: None  Radiology: CT Cervical Spine Wo Contrast Result Date: 08/06/2023 CLINICAL DATA:  Trauma, neck injury a few weeks ago, dizziness worse with movement. EXAM: CT HEAD WITHOUT CONTRAST CT CERVICAL SPINE WITHOUT CONTRAST TECHNIQUE: Multidetector CT imaging of the head and cervical  spine was performed following the standard protocol without intravenous contrast. Multiplanar CT image reconstructions of the cervical spine were also generated. RADIATION DOSE REDUCTION: This exam was performed according to the departmental dose-optimization program which includes automated exposure control, adjustment of the mA and/or kV according to patient size and/or use of iterative reconstruction technique. COMPARISON:  MRI head 03/03/2021.  MRI cervical spine 03/03/2021. FINDINGS: CT HEAD FINDINGS Brain: No acute intracranial hemorrhage. No CT evidence of acute infarct. No edema, mass effect, or midline shift. The basilar cisterns are patent. Ventricles: The ventricles are normal. Vascular: No hyperdense vessel or unexpected calcification. Skull: No acute or aggressive finding. Orbits: Orbits are  symmetric. Sinuses: The visualized paranasal sinuses are clear. Other: Mastoid air cells are clear. CT CERVICAL SPINE FINDINGS Alignment: Straightening of the normal cervical lordosis. No listhesis. No facet subluxation or dislocation. Skull base and vertebrae: No acute fracture. No primary bone lesion or focal pathologic process. Soft tissues and spinal canal: No prevertebral fluid or swelling. No visible canal hematoma. Disc levels: Intervertebral disc spaces are maintained. No high-grade osseous spinal canal stenosis. No high-grade osseous foraminal stenosis. Upper chest: No acute finding. Other: None. IMPRESSION: No CT evidence of acute intracranial abnormality. No acute fracture or traumatic malalignment of the cervical spine. Electronically Signed   By: Donnice Mania M.D.   On: 08/06/2023 19:19   CT Head Wo Contrast Result Date: 08/06/2023 CLINICAL DATA:  Trauma, neck injury a few weeks ago, dizziness worse with movement. EXAM: CT HEAD WITHOUT CONTRAST CT CERVICAL SPINE WITHOUT CONTRAST TECHNIQUE: Multidetector CT imaging of the head and cervical spine was performed following the standard protocol without  intravenous contrast. Multiplanar CT image reconstructions of the cervical spine were also generated. RADIATION DOSE REDUCTION: This exam was performed according to the departmental dose-optimization program which includes automated exposure control, adjustment of the mA and/or kV according to patient size and/or use of iterative reconstruction technique. COMPARISON:  MRI head 03/03/2021.  MRI cervical spine 03/03/2021. FINDINGS: CT HEAD FINDINGS Brain: No acute intracranial hemorrhage. No CT evidence of acute infarct. No edema, mass effect, or midline shift. The basilar cisterns are patent. Ventricles: The ventricles are normal. Vascular: No hyperdense vessel or unexpected calcification. Skull: No acute or aggressive finding. Orbits: Orbits are symmetric. Sinuses: The visualized paranasal sinuses are clear. Other: Mastoid air cells are clear. CT CERVICAL SPINE FINDINGS Alignment: Straightening of the normal cervical lordosis. No listhesis. No facet subluxation or dislocation. Skull base and vertebrae: No acute fracture. No primary bone lesion or focal pathologic process. Soft tissues and spinal canal: No prevertebral fluid or swelling. No visible canal hematoma. Disc levels: Intervertebral disc spaces are maintained. No high-grade osseous spinal canal stenosis. No high-grade osseous foraminal stenosis. Upper chest: No acute finding. Other: None. IMPRESSION: No CT evidence of acute intracranial abnormality. No acute fracture or traumatic malalignment of the cervical spine. Electronically Signed   By: Donnice Mania M.D.   On: 08/06/2023 19:19     Procedures   Medications Ordered in the ED  meclizine  (ANTIVERT ) tablet 25 mg (25 mg Oral Given 08/06/23 2350)                                    Medical Decision Making Amount and/or Complexity of Data Reviewed Radiology: ordered.   Acute onset of vertigo.  Consistent with benign positional vertigo.  Will give a dose of Antivert  here.  And then will treat  her at home with Antivert .  Have her fill up with her regular doctor.  Work note provided.   Final diagnoses:  Benign paroxysmal positional vertigo, unspecified laterality    ED Discharge Orders          Ordered    meclizine  (ANTIVERT ) 25 MG tablet  3 times daily PRN        08/06/23 2348               Hydee Fleece, MD 08/06/23 2353

## 2023-10-31 ENCOUNTER — Other Ambulatory Visit: Payer: Self-pay | Admitting: Neurology

## 2023-11-01 NOTE — Telephone Encounter (Signed)
 Last seen on 05/03/23 Follow up scheduled on 05/02/24

## 2023-11-12 ENCOUNTER — Other Ambulatory Visit: Payer: Self-pay

## 2023-11-12 MED ORDER — FLUZONE 0.5 ML IM SUSY
0.5000 mL | PREFILLED_SYRINGE | Freq: Once | INTRAMUSCULAR | 0 refills | Status: AC
  Filled 2023-11-12: qty 0.5, 1d supply, fill #0

## 2023-12-23 ENCOUNTER — Other Ambulatory Visit: Payer: Self-pay | Admitting: Neurology

## 2023-12-23 NOTE — Telephone Encounter (Signed)
 Last seen on 05/03/23 Follow up scheduled on 05/02/24

## 2024-02-17 ENCOUNTER — Telehealth: Payer: Self-pay | Admitting: Neurology

## 2024-02-17 NOTE — Telephone Encounter (Signed)
 LVM informing pt needs to reschedule her 3/24 appt

## 2024-05-02 ENCOUNTER — Ambulatory Visit: Admitting: Neurology
